# Patient Record
Sex: Female | Born: 1945 | Race: Black or African American | Hispanic: No | Marital: Single | Smoking: Former smoker
Health system: Southern US, Community
[De-identification: ages and names within clinical notes are randomized; demographics above are authoritative.]

## PROBLEM LIST (undated history)

## (undated) DIAGNOSIS — M5136 Other intervertebral disc degeneration, lumbar region: Secondary | ICD-10-CM

## (undated) DIAGNOSIS — E785 Hyperlipidemia, unspecified: Secondary | ICD-10-CM

## (undated) DIAGNOSIS — E119 Type 2 diabetes mellitus without complications: Secondary | ICD-10-CM

## (undated) DIAGNOSIS — I209 Angina pectoris, unspecified: Secondary | ICD-10-CM

## (undated) DIAGNOSIS — K5790 Diverticulosis of intestine, part unspecified, without perforation or abscess without bleeding: Secondary | ICD-10-CM

## (undated) DIAGNOSIS — M51369 Other intervertebral disc degeneration, lumbar region without mention of lumbar back pain or lower extremity pain: Secondary | ICD-10-CM

## (undated) DIAGNOSIS — I6523 Occlusion and stenosis of bilateral carotid arteries: Secondary | ICD-10-CM

## (undated) DIAGNOSIS — K219 Gastro-esophageal reflux disease without esophagitis: Secondary | ICD-10-CM

## (undated) DIAGNOSIS — I700 Atherosclerosis of aorta: Secondary | ICD-10-CM

## (undated) DIAGNOSIS — I10 Essential (primary) hypertension: Secondary | ICD-10-CM

## (undated) DIAGNOSIS — I2510 Atherosclerotic heart disease of native coronary artery without angina pectoris: Secondary | ICD-10-CM

## (undated) HISTORY — PX: KNEE ARTHROSCOPY: SHX127

## (undated) HISTORY — PX: CORONARY ANGIOPLASTY WITH STENT PLACEMENT: SHX49

## (undated) HISTORY — PX: CATARACT EXTRACTION, BILATERAL: SHX1313

---

## 1999-02-17 DIAGNOSIS — I214 Non-ST elevation (NSTEMI) myocardial infarction: Secondary | ICD-10-CM

## 1999-02-17 HISTORY — DX: Non-ST elevation (NSTEMI) myocardial infarction: I21.4

## 2003-12-30 ENCOUNTER — Ambulatory Visit: Payer: Self-pay | Admitting: Occupational Therapy

## 2005-01-02 ENCOUNTER — Ambulatory Visit: Payer: Self-pay | Admitting: Occupational Therapy

## 2006-01-03 ENCOUNTER — Ambulatory Visit: Payer: Self-pay | Admitting: Internal Medicine

## 2007-01-06 ENCOUNTER — Ambulatory Visit: Payer: Self-pay | Admitting: Internal Medicine

## 2007-07-06 ENCOUNTER — Inpatient Hospital Stay: Payer: Self-pay | Admitting: Orthopedic Surgery

## 2007-07-06 ENCOUNTER — Emergency Department (HOSPITAL_COMMUNITY): Admission: EM | Admit: 2007-07-06 | Discharge: 2007-07-06 | Payer: Self-pay | Admitting: Emergency Medicine

## 2007-11-05 ENCOUNTER — Ambulatory Visit: Payer: Self-pay | Admitting: Orthopedic Surgery

## 2008-01-08 ENCOUNTER — Ambulatory Visit: Payer: Self-pay | Admitting: Internal Medicine

## 2008-09-15 ENCOUNTER — Ambulatory Visit: Payer: Self-pay | Admitting: Unknown Physician Specialty

## 2008-12-29 ENCOUNTER — Ambulatory Visit: Payer: Self-pay | Admitting: Internal Medicine

## 2009-01-25 ENCOUNTER — Ambulatory Visit: Payer: Self-pay | Admitting: Internal Medicine

## 2010-01-26 ENCOUNTER — Ambulatory Visit: Payer: Self-pay | Admitting: Internal Medicine

## 2010-01-27 ENCOUNTER — Ambulatory Visit: Payer: Self-pay | Admitting: Internal Medicine

## 2010-10-05 LAB — DIFFERENTIAL
Basophils Absolute: 0
Basophils Relative: 0
Eosinophils Absolute: 0
Neutro Abs: 5.8
Neutrophils Relative %: 74

## 2010-10-05 LAB — BASIC METABOLIC PANEL
BUN: 13
CO2: 30
Calcium: 9.1
Chloride: 103
Creatinine, Ser: 0.83
GFR calc Af Amer: >60

## 2010-10-05 LAB — CBC
MCHC: 33.9
MCV: 89.1
Platelets: 174
RBC: 4.13
RDW: 13.5

## 2010-10-07 DIAGNOSIS — E782 Mixed hyperlipidemia: Secondary | ICD-10-CM | POA: Insufficient documentation

## 2010-10-07 DIAGNOSIS — M7120 Synovial cyst of popliteal space [Baker], unspecified knee: Secondary | ICD-10-CM | POA: Insufficient documentation

## 2010-10-07 DIAGNOSIS — I2510 Atherosclerotic heart disease of native coronary artery without angina pectoris: Secondary | ICD-10-CM | POA: Insufficient documentation

## 2011-03-12 ENCOUNTER — Ambulatory Visit: Payer: Self-pay | Admitting: Internal Medicine

## 2012-03-12 ENCOUNTER — Ambulatory Visit: Payer: Self-pay | Admitting: Internal Medicine

## 2012-08-13 ENCOUNTER — Ambulatory Visit: Payer: Self-pay | Admitting: Ophthalmology

## 2012-08-26 ENCOUNTER — Ambulatory Visit: Payer: Self-pay | Admitting: Ophthalmology

## 2012-10-07 ENCOUNTER — Ambulatory Visit: Payer: Self-pay | Admitting: Ophthalmology

## 2012-10-22 ENCOUNTER — Ambulatory Visit: Payer: Self-pay | Admitting: Ophthalmology

## 2013-05-12 ENCOUNTER — Ambulatory Visit: Payer: Self-pay | Admitting: Internal Medicine

## 2013-09-23 DIAGNOSIS — Z83719 Family history of colon polyps, unspecified: Secondary | ICD-10-CM | POA: Insufficient documentation

## 2013-09-23 DIAGNOSIS — Z8371 Family history of colonic polyps: Secondary | ICD-10-CM | POA: Insufficient documentation

## 2013-10-09 ENCOUNTER — Ambulatory Visit: Payer: Self-pay | Admitting: Unknown Physician Specialty

## 2014-04-30 NOTE — Op Note (Signed)
PATIENT NAME:  Dawn Ford, Dawn Ford MR#:  [id number] DATE OF BIRTH:  1945-09-29  DATE OF PROCEDURE:  10/22/2012  PREOPERATIVE DIAGNOSIS: Visually significant cataract of the right eye.   POSTOPERATIVE DIAGNOSIS: Visually significant cataract of the right eye.   OPERATIVE PROCEDURE: Cataract extraction by phacoemulsification with implant of intraocular lens to right eye.   SURGEON: Birder Robson, MD  ANESTHESIA:  1. Managed anesthesia care.  2. Topical tetracaine drops followed by 2% Xylocaine jelly applied in the preoperative holding area.   COMPLICATIONS: None.   TECHNIQUE:  Stop-and-chop.    DESCRIPTION OF PROCEDURE: The patient was examined and consented in the preoperative holding area where the aforementioned topical anesthesia was applied to the right eye and then brought back to the Operating Room where the right eye was prepped and draped in the usual sterile ophthalmic fashion and a lid speculum was placed. A paracentesis was created with the side port blade and the anterior chamber was filled with viscoelastic. A near clear corneal incision was performed with the steel keratome. A continuous curvilinear capsulorrhexis was performed with a cystotome followed by the capsulorrhexis forceps. Hydrodissection and hydrodelineation were carried out with BSS on a blunt cannula. The lens was removed in a stop-and-chop technique and the remaining cortical material was removed with the irrigation-aspiration handpiece. The capsular bag was inflated with viscoelastic and the Tecnis ZCB00, 22.5-diopter lens, serial number [id number] was placed in the capsular bag without complication. The remaining viscoelastic was removed from the eye with the irrigation-aspiration handpiece. The wounds were hydrated. The anterior chamber was flushed with Miostat and the eye was inflated to physiologic pressure. 0.1 mL of cefuroxime concentration 10 mg/mL was placed in the anterior chamber. The wounds were found to be  water tight. The eye was dressed with Vigamox. The patient was given protective glasses to wear throughout the day and a shield with which to sleep tonight. The patient was also given drops with which to begin a drop regimen today and will follow-up with me in 1 day.   ____________________________ Livingston Diones. Amantha Sklar, MD wlp:cs D: 10/22/2012 17:11:27 ET T: 10/22/2012 20:01:39 ET JOB#: [id number]  cc: Yocheved Depner L. Moustafa Mossa, MD, <Dictator>   Livingston Diones Welden Hausmann MD ELECTRONICALLY SIGNED 10/27/2012 13:19

## 2014-04-30 NOTE — Op Note (Signed)
PATIENT NAME:  Dawn Ford, Dawn Ford MR#:  [id number] DATE OF BIRTH:  Oct 24, 1945  DATE OF PROCEDURE:  08/26/2012  PREOPERATIVE DIAGNOSIS: Visually significant cataract of the left eye.   POSTOPERATIVE DIAGNOSIS: Visually significant cataract of the left eye.   OPERATIVE PROCEDURE: Cataract extraction by phacoemulsification with implant of intraocular lens to left eye.   SURGEON: Birder Robson, MD.   ANESTHESIA:  1. Managed anesthesia care.  2. Topical tetracaine drops followed by 2% Xylocaine jelly applied in the preoperative holding area.   COMPLICATIONS: None.   TECHNIQUE:  Stop and chop.    DESCRIPTION OF PROCEDURE: The patient was examined and consented in the preoperative holding area where the aforementioned topical anesthesia was applied to the left eye and then brought back to the Operating Room where the left eye was prepped and draped in the usual sterile ophthalmic fashion and a lid speculum was placed. A paracentesis was created with the side port blade and the anterior chamber was filled with viscoelastic. A near clear corneal incision was performed with the steel keratome. A continuous curvilinear capsulorrhexis was performed with a cystotome followed by the capsulorrhexis forceps. Hydrodissection and hydrodelineation were carried out with BSS on a blunt cannula. The lens was removed in a stop and chop technique and the remaining cortical material was removed with the irrigation-aspiration handpiece. The capsular bag was inflated with viscoelastic and the Tecnis ZCB00 21.5 diopter lens, serial [id number] was placed in the capsular bag without complication. The remaining viscoelastic was removed from the eye with the irrigation-aspiration handpiece. The wounds were hydrated. The anterior chamber was flushed with Miostat and the eye was inflated to physiologic pressure. 0.1 mL of cefuroxime concentration 10 mg/mL was placed in the anterior chamber. The wounds were found to be water tight.  The eye was dressed with Vigamox. The patient was given protective glasses to wear throughout the day and a shield with which to sleep tonight. The patient was also given drops with which to begin a drop regimen today and will follow-up with me in one day.     ____________________________ Livingston Diones. Ilai Hiller, MD wlp:cc D: 08/26/2012 14:25:02 ET T: 08/26/2012 17:52:42 ET JOB#: [id number]  cc: Devra Stare L. Bassel Gaskill, MD, <Dictator> Livingston Diones Gerry Blanchfield MD ELECTRONICALLY SIGNED 08/27/2012 13:37

## 2014-06-15 ENCOUNTER — Other Ambulatory Visit: Payer: Self-pay | Admitting: Internal Medicine

## 2014-06-15 DIAGNOSIS — Z1231 Encounter for screening mammogram for malignant neoplasm of breast: Secondary | ICD-10-CM

## 2014-06-22 ENCOUNTER — Ambulatory Visit
Admission: RE | Admit: 2014-06-22 | Discharge: 2014-06-22 | Disposition: A | Discharge status: Home / Self Care | Payer: Medicare Other | Source: Ambulatory Visit | Attending: Internal Medicine | Admitting: Internal Medicine

## 2014-06-22 ENCOUNTER — Other Ambulatory Visit: Payer: Self-pay | Admitting: Internal Medicine

## 2014-06-22 DIAGNOSIS — Z1231 Encounter for screening mammogram for malignant neoplasm of breast: Secondary | ICD-10-CM

## 2014-12-16 DIAGNOSIS — I10 Essential (primary) hypertension: Secondary | ICD-10-CM | POA: Insufficient documentation

## 2014-12-23 DIAGNOSIS — I6523 Occlusion and stenosis of bilateral carotid arteries: Secondary | ICD-10-CM | POA: Insufficient documentation

## 2015-02-02 DIAGNOSIS — E1129 Type 2 diabetes mellitus with other diabetic kidney complication: Secondary | ICD-10-CM | POA: Insufficient documentation

## 2015-02-02 DIAGNOSIS — R809 Proteinuria, unspecified: Secondary | ICD-10-CM | POA: Insufficient documentation

## 2015-05-02 ENCOUNTER — Other Ambulatory Visit: Payer: Self-pay | Admitting: Internal Medicine

## 2015-05-02 DIAGNOSIS — Z1231 Encounter for screening mammogram for malignant neoplasm of breast: Secondary | ICD-10-CM

## 2015-06-23 ENCOUNTER — Ambulatory Visit
Admission: RE | Admit: 2015-06-23 | Discharge: 2015-06-23 | Disposition: A | Discharge status: Home / Self Care | Payer: Medicare Other | Source: Ambulatory Visit | Attending: Internal Medicine | Admitting: Internal Medicine

## 2015-06-23 ENCOUNTER — Other Ambulatory Visit: Payer: Self-pay | Admitting: Internal Medicine

## 2015-06-23 DIAGNOSIS — Z1231 Encounter for screening mammogram for malignant neoplasm of breast: Secondary | ICD-10-CM

## 2016-03-04 ENCOUNTER — Emergency Department
Admission: EM | Admit: 2016-03-04 | Discharge: 2016-03-04 | Disposition: A | Discharge status: Home / Self Care | Payer: Medicare Other | Attending: Emergency Medicine | Admitting: Emergency Medicine

## 2016-03-04 DIAGNOSIS — I10 Essential (primary) hypertension: Secondary | ICD-10-CM | POA: Insufficient documentation

## 2016-03-04 DIAGNOSIS — E119 Type 2 diabetes mellitus without complications: Secondary | ICD-10-CM | POA: Diagnosis not present

## 2016-03-04 DIAGNOSIS — K529 Noninfective gastroenteritis and colitis, unspecified: Secondary | ICD-10-CM | POA: Diagnosis not present

## 2016-03-04 DIAGNOSIS — R8299 Other abnormal findings in urine: Secondary | ICD-10-CM | POA: Diagnosis not present

## 2016-03-04 DIAGNOSIS — R112 Nausea with vomiting, unspecified: Secondary | ICD-10-CM | POA: Diagnosis present

## 2016-03-04 HISTORY — DX: Type 2 diabetes mellitus without complications: E11.9

## 2016-03-04 HISTORY — DX: Essential (primary) hypertension: I10

## 2016-03-04 HISTORY — DX: Hyperlipidemia, unspecified: E78.5

## 2016-03-04 LAB — URINALYSIS, COMPLETE (UACMP) WITH MICROSCOPIC
Bilirubin Urine: NEGATIVE
GLUCOSE, UA: NEGATIVE mg/dL
Hgb urine dipstick: NEGATIVE
Ketones, ur: NEGATIVE mg/dL
Nitrite: NEGATIVE
PROTEIN: NEGATIVE mg/dL
Specific Gravity, Urine: 1.017 (ref 1.005–1.030)
pH: 5 (ref 5.0–8.0)

## 2016-03-04 LAB — COMPREHENSIVE METABOLIC PANEL
ALBUMIN: 3.9 g/dL (ref 3.5–5.0)
ALK PHOS: 59 U/L (ref 38–126)
ALT: 16 U/L (ref 14–54)
AST: 22 U/L (ref 15–41)
Anion gap: 8 (ref 5–15)
BUN: 14 mg/dL (ref 6–20)
CALCIUM: 9.2 mg/dL (ref 8.9–10.3)
CO2: 29 mmol/L (ref 22–32)
CREATININE: 0.68 mg/dL (ref 0.44–1.00)
Chloride: 103 mmol/L (ref 101–111)
GFR calc Af Amer: >60 mL/min (ref >60)
GFR calc non Af Amer: >60 mL/min (ref >60)
GLUCOSE: 139 mg/dL — AB (ref 65–99)
Potassium: 3.7 mmol/L (ref 3.5–5.1)
SODIUM: 140 mmol/L (ref 135–145)
Total Bilirubin: 0.7 mg/dL (ref 0.3–1.2)
Total Protein: 8 g/dL (ref 6.5–8.1)

## 2016-03-04 LAB — LIPASE, BLOOD: Lipase: 39 U/L (ref 11–51)

## 2016-03-04 LAB — CBC
HCT: 36.3 % (ref 35.0–47.0)
HEMOGLOBIN: 12.2 g/dL (ref 12.0–16.0)
MCH: 30 pg (ref 26.0–34.0)
MCHC: 33.5 g/dL (ref 32.0–36.0)
MCV: 89.4 fL (ref 80.0–100.0)
Platelets: 229 10*3/uL (ref 150–440)
RBC: 4.06 MIL/uL (ref 3.80–5.20)
RDW: 12.8 % (ref 11.5–14.5)
WBC: 7 10*3/uL (ref 3.6–11.0)

## 2016-03-04 LAB — TROPONIN I: Troponin I: <0.03 ng/mL (ref <0.03)

## 2016-03-04 MED ORDER — ONDANSETRON 4 MG PO TBDP
4.0000 mg | ORAL_TABLET | Freq: Once | ORAL | Status: AC
  Administered 2016-03-04: 4 mg via ORAL

## 2016-03-04 MED ORDER — ONDANSETRON HCL 4 MG PO TABS: 4.0000 mg | ORAL_TABLET | Freq: Three times a day (TID) | ORAL | 0 refills | Status: DC | PRN

## 2016-03-04 MED ORDER — ONDANSETRON 4 MG PO TBDP
ORAL_TABLET | ORAL | Status: AC
  Administered 2016-03-04: 4 mg via ORAL
  Filled 2016-03-04: qty 1

## 2016-03-04 MED ORDER — DICYCLOMINE HCL 20 MG PO TABS: 20.0000 mg | ORAL_TABLET | Freq: Three times a day (TID) | ORAL | 0 refills | Status: DC | PRN

## 2016-03-04 NOTE — ED Notes (Signed)
Pt drinking ginger ale without emesis

## 2016-03-04 NOTE — ED Triage Notes (Signed)
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

## 2016-03-04 NOTE — ED Notes (Signed)
Diarrhea and vomiting for 2 days. Denies any fevers at home.  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 No vomiting since last night. No diarrhea today. Pt was nauseated this morning. Pt has been able to eat some applesauce and toast today.

## 2016-03-04 NOTE — Discharge Instructions (Signed)
Please advanced diet as tolerated and you can take occasional Imodium over-the-counter for diarrhea. Return to emergency Department for bloody diarrhea, high fever, increasing localized abdominal pain, or any other new concerns  Please return immediately if condition worsens. Please contact her primary physician or the physician you were given for referral. If you have any specialist physicians involved in her treatment and plan please also contact them. Thank you for using [place] [facility].

## 2016-03-04 NOTE — ED Provider Notes (Signed)
Time Seen: Approximately 1623  I have reviewed the triage notes  Chief Complaint: Emesis and Diarrhea   History of Present Illness: Dawn Ford is a 71 y.o. female *who presents with nausea, vomiting and diarrhea over the last 2 days. She states the nausea and vomiting part has decreased mildly though she still feels nauseated. She's had a decreased appetite without any fever or focal abdominal pain. She describes loose watery stools, just 1 today. She denies any melena or hematochezia. 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 She denies any lightheadedness. She denies any recent antibiotics or travel. She is not aware of any foodborne exposure.   Past Medical History:  Diagnosis Date  . Diabetes mellitus without complication ([place])   . Hyperlipemia   . Hypertension     There are no active problems to display for this patient.   History reviewed. No pertinent surgical history.  History reviewed. No pertinent surgical history.  Current Outpatient Rx  . Order #: [id number] Class: Print  . Order #: [id number] Class: Print    Allergies:  Patient has no known allergies.  Family History: No family history on file.  Social History: Social History  Substance Use Topics  . Smoking status: Never Smoker  . Smokeless tobacco: Never Used  . Alcohol use No     Review of Systems:   10 point review of systems was performed and was otherwise negative:  Constitutional: No fever Eyes: No visual disturbances ENT: No sore throat, ear pain Cardiac: No chest pain Respiratory: No shortness of breath, wheezing, or stridor Abdomen: No abdominal pain,  Endocrine: No weight loss, No night sweats Extremities: No peripheral edema, cyanosis Skin: No rashes, easy bruising Neurologic: No focal weakness, trouble with speech or swollowing Urologic: No dysuria, Hematuria, or urinary frequency   Physical Exam:  ED Triage Vitals  Enc Vitals Group      BP 03/04/16 1503 (!) 157/55     Pulse Rate 03/04/16 1503 75     Resp 03/04/16 1503 18     Temp 03/04/16 1503 99.1 F (37.3 C)     Temp Source 03/04/16 1503 Oral     SpO2 03/04/16 1503 96 %     Weight 03/04/16 1458 180 lb (81.6 kg)     Height 03/04/16 1458 5\' 6"  (1.676 m)     Head Circumference --      Peak Flow --      Pain Score 03/04/16 1624 0     Pain Loc --      Pain Edu? --      Excl. in [place]? --     General: Awake , Alert , and Oriented times 3; GCS 15 Head: Normal cephalic , atraumatic Eyes: Pupils equal , round, reactive to light Nose/Throat: No nasal drainage, patent upper airway without erythema or exudate.  Neck: Supple, Full range of motion, No anterior adenopathy or palpable thyroid masses Lungs: Clear to ascultation without wheezes , rhonchi, or rales Heart: Regular rate, regular rhythm without murmurs , gallops , or rubs Abdomen: Soft, non tender without rebound, guarding , or rigidity; bowel sounds positive and symmetric in all 4 quadrants. No organomegaly .        Extremities: 2 plus symmetric pulses. No edema, clubbing or cyanosis Neurologic: normal ambulation, Motor symmetric without deficits, sensory intact Skin: warm, dry, no rashes   Labs:   All laboratory work was reviewed including any pertinent negatives or positives listed below:  Labs Reviewed  COMPREHENSIVE METABOLIC PANEL - Abnormal; Notable for the following:       Result Value   Glucose, Bld 139 (*)    All other components within normal limits  URINALYSIS, COMPLETE (UACMP) WITH MICROSCOPIC - Abnormal; Notable for the following:    Color, Urine YELLOW (*)    APPearance HAZY (*)    Leukocytes, UA MODERATE (*)    Bacteria, UA RARE (*)    Squamous Epithelial / LPF 0-5 (*)    All other components within normal limits  URINE CULTURE  LIPASE, BLOOD  CBC  TROPONIN I  Patient's urine was equivocal and a urine culture was added. Otherwise, laboratory work within normal limits  EKG:  ED ECG  REPORT I, Daymon Larsen, the attending physician, personally viewed and interpreted this ECG.  Date: 03/04/2016 EKG Time: 1509 Rate: *74 Rhythm: normal sinus rhythm QRS Axis: normal Intervals: normal ST/T Wave abnormalities: Nonspecific T wave abnormality Conduction Disturbances: none Narrative Interpretation: unremarkable   ED Course:  Patient's stay here was uneventful and the patient's otherwise hemodynamically stable and does not appear to be significantly dehydrated or have an acute abdomen, etc. Given her clinical presentation and objective findings I felt most likely this was viral gastroenteritis. She was advised to return here if she develops bloody diarrhea, fever or focal abdominal pain or any other new concerns.      Final Clinical Impression:   Final diagnoses:  Gastroenteritis     Plan:  Outpatient " New Prescriptions   DICYCLOMINE (BENTYL) 20 MG TABLET    Take 1 tablet (20 mg total) by mouth 3 (three) times daily as needed for spasms.   ONDANSETRON (ZOFRAN) 4 MG TABLET    Take 1 tablet (4 mg total) by mouth every 8 (eight) hours as needed for nausea or vomiting.  "  Patient was advised to return immediately if condition worsens. Patient was advised to follow up with their primary care physician or other specialized physicians involved in their outpatient care. The patient and/or family member/power of attorney had laboratory results reviewed at the bedside. All questions and concerns were addressed and appropriate discharge instructions were distributed by the nursing staff.             Daymon Larsen, MD 03/04/16 [phone]

## 2016-03-06 LAB — URINE CULTURE

## 2016-05-11 ENCOUNTER — Other Ambulatory Visit: Payer: Self-pay | Admitting: Internal Medicine

## 2016-05-11 DIAGNOSIS — Z1231 Encounter for screening mammogram for malignant neoplasm of breast: Secondary | ICD-10-CM

## 2016-06-25 ENCOUNTER — Ambulatory Visit
Admission: RE | Admit: 2016-06-25 | Discharge: 2016-06-25 | Disposition: A | Discharge status: Home / Self Care | Payer: Medicare Other | Source: Ambulatory Visit | Attending: Internal Medicine | Admitting: Internal Medicine

## 2016-06-25 DIAGNOSIS — Z1231 Encounter for screening mammogram for malignant neoplasm of breast: Secondary | ICD-10-CM | POA: Diagnosis present

## 2016-12-13 ENCOUNTER — Emergency Department
Admission: EM | Admit: 2016-12-13 | Discharge: 2016-12-13 | Disposition: A | Discharge status: Home / Self Care | Payer: Medicare Other | Attending: Emergency Medicine | Admitting: Emergency Medicine

## 2016-12-13 ENCOUNTER — Other Ambulatory Visit: Payer: Self-pay

## 2016-12-13 ENCOUNTER — Emergency Department: Payer: Medicare Other

## 2016-12-13 ENCOUNTER — Telehealth: Payer: Self-pay | Admitting: Gastroenterology

## 2016-12-13 DIAGNOSIS — R0989 Other specified symptoms and signs involving the circulatory and respiratory systems: Secondary | ICD-10-CM | POA: Insufficient documentation

## 2016-12-13 DIAGNOSIS — K228 Other specified diseases of esophagus: Secondary | ICD-10-CM

## 2016-12-13 DIAGNOSIS — Z79899 Other long term (current) drug therapy: Secondary | ICD-10-CM | POA: Insufficient documentation

## 2016-12-13 DIAGNOSIS — R198 Other specified symptoms and signs involving the digestive system and abdomen: Secondary | ICD-10-CM

## 2016-12-13 DIAGNOSIS — E119 Type 2 diabetes mellitus without complications: Secondary | ICD-10-CM | POA: Diagnosis not present

## 2016-12-13 DIAGNOSIS — I10 Essential (primary) hypertension: Secondary | ICD-10-CM | POA: Diagnosis not present

## 2016-12-13 LAB — CBC WITH DIFFERENTIAL/PLATELET
BASOS ABS: 0 10*3/uL (ref 0–0.1)
Basophils Relative: 1 %
Eosinophils Absolute: 0.1 10*3/uL (ref 0–0.7)
Eosinophils Relative: 1 %
HEMATOCRIT: 35.9 % (ref 35.0–47.0)
Hemoglobin: 11.8 g/dL — ABNORMAL LOW (ref 12.0–16.0)
LYMPHS ABS: 1.2 10*3/uL (ref 1.0–3.6)
LYMPHS PCT: 17 %
MCH: 29.4 pg (ref 26.0–34.0)
MCHC: 33 g/dL (ref 32.0–36.0)
MCV: 89.2 fL (ref 80.0–100.0)
MONO ABS: 0.5 10*3/uL (ref 0.2–0.9)
Monocytes Relative: 7 %
NEUTROS ABS: 5.3 10*3/uL (ref 1.4–6.5)
Neutrophils Relative %: 74 %
Platelets: 229 10*3/uL (ref 150–440)
RBC: 4.02 MIL/uL (ref 3.80–5.20)
RDW: 13.7 % (ref 11.5–14.5)
WBC: 7.1 10*3/uL (ref 3.6–11.0)

## 2016-12-13 LAB — COMPREHENSIVE METABOLIC PANEL
ALT: 17 U/L (ref 14–54)
AST: 24 U/L (ref 15–41)
Albumin: 3.8 g/dL (ref 3.5–5.0)
Alkaline Phosphatase: 74 U/L (ref 38–126)
Anion gap: 9 (ref 5–15)
BILIRUBIN TOTAL: 0.6 mg/dL (ref 0.3–1.2)
BUN: 13 mg/dL (ref 6–20)
CO2: 26 mmol/L (ref 22–32)
CREATININE: 0.89 mg/dL (ref 0.44–1.00)
Calcium: 9.4 mg/dL (ref 8.9–10.3)
Chloride: 101 mmol/L (ref 101–111)
GFR calc Af Amer: >60 mL/min (ref >60)
Glucose, Bld: 161 mg/dL — ABNORMAL HIGH (ref 65–99)
Potassium: 3.5 mmol/L (ref 3.5–5.1)
Sodium: 136 mmol/L (ref 135–145)
TOTAL PROTEIN: 8.1 g/dL (ref 6.5–8.1)

## 2016-12-13 LAB — TROPONIN I: Troponin I: <0.03 ng/mL (ref <0.03)

## 2016-12-13 LAB — LIPASE, BLOOD: LIPASE: 44 U/L (ref 11–51)

## 2016-12-13 MED ORDER — GI COCKTAIL ~~LOC~~
30.0000 mL | Freq: Once | ORAL | Status: AC
  Administered 2016-12-13: 30 mL via ORAL
  Filled 2016-12-13: qty 30

## 2016-12-13 MED ORDER — FAMOTIDINE 40 MG PO TABS: 40.0000 mg | ORAL_TABLET | Freq: Every evening | ORAL | 1 refills | Status: DC

## 2016-12-13 MED ORDER — FAMOTIDINE IN NACL 20-0.9 MG/50ML-% IV SOLN
20.0000 mg | Freq: Once | INTRAVENOUS | Status: AC
  Administered 2016-12-13: 20 mg via INTRAVENOUS
  Filled 2016-12-13: qty 50

## 2016-12-13 NOTE — ED Notes (Signed)
Pt resting in bed, resp even and unlabored, denies any needs, TV turned on

## 2016-12-13 NOTE — ED Triage Notes (Signed)
Pt states she feels like food or something in stuck in her esophagus since last night, states normally when she feels like this she takes 2 tums and it will go away but states she is not feeling any better.

## 2016-12-13 NOTE — Telephone Encounter (Signed)
lvm for patient to call and schedule a 1-2 week follow up with Dr. Bonna Gains

## 2016-12-13 NOTE — ED Provider Notes (Addendum)
[facility] Emergency Department Provider Note  ____________________________________________   I have reviewed the triage vital signs and the nursing notes.   HISTORY  Chief Complaint Foreign Body    HPI Dawn Ford is a 71 y.o. female with a history of diabetes, as well as acid indigestion, hypertension presents today complaining of a foreign body sensation in her throat.  When she swallows she feels it "go down" but otherwise that the discomfort in her throat.  She is able to tolerate liquids with no difficulty.  She does take pills, she took a pill last night before going to bed and felt okay.  And then at 4:00 she woke up with a discomfort in her throat which is again made slightly better when she swallows.  She has no pain, is a sensation of a foreign body she states.  She has had no fever chills no nausea no vomiting she is most aware of it when she begins to swallow then as she swallows and feels better and then it seems to persist a little bit after swallowing.  No abdominal pain.  She has no pain of any variety.  She has no exertional symptoms no shortness of breath no pleuritic chest pain, she states that it is just a sensation in her esophagus that seems to get better when she swallows.  It feels like her reflux in the past.  She has no other complaints.  No melena no bright red blood per rectum.   Location: Lower mid chest Radiation: None Quality: See above Duration: This morning Timing: Seems to be in some ways changed by swallowing Severity: Painless Associated sxs: None PriorTreatment : Antiacids in the past   Past Medical History:  Diagnosis Date  . Diabetes mellitus without complication ([place])   . Hyperlipemia   . Hypertension     There are no active problems to display for this patient.   Past Surgical History:  Procedure Laterality Date  . CORONARY ANGIOPLASTY WITH STENT PLACEMENT    . KNEE ARTHROSCOPY      Prior to Admission  medications   Medication Sig Start Date End Date Taking? Authorizing Provider  dicyclomine (BENTYL) 20 MG tablet Take 1 tablet (20 mg total) by mouth 3 (three) times daily as needed for spasms. 03/04/16   Daymon Larsen, MD  ondansetron (ZOFRAN) 4 MG tablet Take 1 tablet (4 mg total) by mouth every 8 (eight) hours as needed for nausea or vomiting. 03/04/16   Daymon Larsen, MD    Allergies Patient has no known allergies.  No family history on file.  Social History Social History   Tobacco Use  . Smoking status: Never Smoker  . Smokeless tobacco: Never Used  Substance Use Topics  . Alcohol use: No  . Drug use: No    Review of Systems Constitutional: No fever/chills Eyes: No visual changes. ENT: No sore throat. No stiff neck no neck pain Cardiovascular: Denies chest pain. Respiratory: Denies shortness of breath. Gastrointestinal:   no vomiting.  No diarrhea.  No constipation. Genitourinary: Negative for dysuria. Musculoskeletal: Negative lower extremity swelling Skin: Negative for rash. Neurological: Negative for severe headaches, focal weakness or numbness.   ____________________________________________   PHYSICAL EXAM:  VITAL SIGNS: ED Triage Vitals  Enc Vitals Group     BP 12/13/16 0816 (!) 177/55     Pulse Rate 12/13/16 0816 72     Resp 12/13/16 0816 16     Temp 12/13/16 0816 98 F (36.7 C)  Temp Source 12/13/16 0816 Oral     SpO2 12/13/16 0816 100 %     Weight 12/13/16 0816 188 lb (85.3 kg)     Height 12/13/16 0816 5\' 7"  (1.702 m)     Head Circumference --      Peak Flow --      Pain Score 12/13/16 0818 7     Pain Loc --      Pain Edu? --      Excl. in [place]? --     Constitutional: Alert and oriented. Well appearing and in no acute distress. Eyes: Conjunctivae are normal Head: Atraumatic HEENT: No congestion/rhinnorhea. Mucous membranes are moist.  Oropharynx non-erythematous Neck:   Nontender with no meningismus, no masses, no  stridor Cardiovascular: Normal rate, regular rhythm. Grossly normal heart sounds.  Good peripheral circulation. Respiratory: Normal respiratory effort.  No retractions. Lungs CTAB. Abdominal: Soft and nontender. No distention. No guarding no rebound Back:  There is no focal tenderness or step off.  there is no midline tenderness there are no lesions noted. there is no CVA tenderness Musculoskeletal: No lower extremity tenderness, no upper extremity tenderness. No joint effusions, no DVT signs strong distal pulses no edema Neurologic:  Normal speech and language. No gross focal neurologic deficits are appreciated.  Skin:  Skin is warm, dry and intact. No rash noted. Psychiatric: Mood and affect are normal. Speech and behavior are normal.  ____________________________________________   LABS (all labs ordered are listed, but only abnormal results are displayed)  Labs Reviewed  CBC WITH DIFFERENTIAL/PLATELET - Abnormal; Notable for the following components:      Result Value   Hemoglobin 11.8 (*)    All other components within normal limits  COMPREHENSIVE METABOLIC PANEL - Abnormal; Notable for the following components:   Glucose, Bld 161 (*)    All other components within normal limits  TROPONIN I  LIPASE, BLOOD    Pertinent labs  results that were available during my care of the patient were reviewed by me and considered in my medical decision making (see chart for details). ____________________________________________  EKG  I personally interpreted any EKGs ordered by me or triage Sinus rhythm rate 66 bpm no acute ST elevation or depression not specific ST flattening, normal axis ____________________________________________  RADIOLOGY  Pertinent labs & imaging results that were available during my care of the patient were reviewed by me and considered in my medical decision making (see chart for details). If possible, patient and/or family made aware of any abnormal  findings.  Dg Chest 2 View  Result Date: 12/13/2016 CLINICAL DATA:  Sensation of foreign body at the sternal notch. EXAM: CHEST  2 VIEW COMPARISON:  07/06/2007 FINDINGS: Heart size is normal. There is aortic atherosclerosis. Lungs are clear. No effusions. Mediastinal shadows appear otherwise normal without radiopaque foreign object. IMPRESSION: No active cardiopulmonary disease. Aortic atherosclerosis. No radiopaque foreign object. Electronically Signed   By: Nelson Chimes M.D.   On: 12/13/2016 08:56   ____________________________________________    PROCEDURES  Procedure(s) performed: None  Procedures  Critical Care performed: None  ____________________________________________   INITIAL IMPRESSION / ASSESSMENT AND PLAN / ED COURSE  Pertinent labs & imaging results that were available during my care of the patient were reviewed by me and considered in my medical decision making (see chart for details).  She is here with a throat, she has no evidence of impaction she is tolerating secretions and water well, it is somewhat altered I guess is the best way to  say when she swallows.  Seems to get a little bit better when she swallows and then come back.  We gave her a GI cocktail and her symptoms completely resolved.  Even though I very low suspicion given this history of ACS I did do a troponin.  Despite 5 hours of continued symptoms, the troponin and EKG are reassuring I do not think this represents ACS or PE or dissection I do not think that the patient requires prolonged serial enzymes given negative enzymes after 5 hours of continued symptomology.  Symptoms certainly are not consistent with ACS in any event, and I did do a troponin out of an abundance of caution given her age and comorbidities.  No evidence of obstruction or ongoing foreign body sensation.  We will discussed with GI.  She likely can follow closely with them for further care.  She has no symptoms after GI cocktail and while  this is not enough to rule out cardiac disease it certainly is suggestive in context of all of her symptoms of a possible proximal GI pathology  ----------------------------------------- 11:10 AM on 12/13/2016 -----------------------------------------  D/w dr. Bonna Gains, we discussed the patient's symptoms signs findings and complaint etc. and she agrees with outpatient follow-up in her office.  They will call her for an appointment at the time they deemed reasonable.  Patient continues to have no symptoms here.  I have continue to observe her she has no ongoing symptoms we will send another cardiac marker and I will give her antiacids as I suspect this is mostly related to that.  He is able to eat and drink here with no discomfort at this time after GI cocktail   ____________________________________________   FINAL CLINICAL IMPRESSION(S) / ED DIAGNOSES  Final diagnoses:  None      This chart was dictated using voice recognition software.  Despite best efforts to proofread,  errors can occur which can change meaning.      Schuyler Amor, MD 12/13/16 1696    Schuyler Amor, MD 12/13/16 [id number]    Schuyler Amor, MD 12/13/16 [id number]

## 2016-12-13 NOTE — ED Notes (Signed)
Pt alert and oriented X4, active, cooperative, pt in NAD. RR even and unlabored, color WNL.  Pt informed to return if any life threatening symptoms occur.  Discharge and followup instructions reviewed.  

## 2016-12-13 NOTE — ED Notes (Signed)
Patient transported to X-ray 

## 2016-12-13 NOTE — Discharge Instructions (Signed)
Return to the emergency room for any chest pain, shortness of breath, nausea, vomiting, bleeding, black stool, any difficulty swallowing food, liquid, or saliva, fever,, or any other concerns.  Follow closely with her primary care doctor and with GI medicine, take antiacids as prescribed at home

## 2016-12-13 NOTE — ED Notes (Signed)
Feeling of something stuck is now gone after medication. No needs at this time.

## 2017-02-13 ENCOUNTER — Other Ambulatory Visit: Payer: Self-pay

## 2017-02-13 ENCOUNTER — Encounter: Payer: Self-pay | Admitting: Gastroenterology

## 2017-02-13 ENCOUNTER — Encounter (INDEPENDENT_AMBULATORY_CARE_PROVIDER_SITE_OTHER): Payer: Self-pay

## 2017-02-13 ENCOUNTER — Ambulatory Visit (INDEPENDENT_AMBULATORY_CARE_PROVIDER_SITE_OTHER): Payer: Medicare Other | Admitting: Gastroenterology

## 2017-02-13 VITALS — BP 131/70 | HR 76 | Ht 67.0 in | Wt 189.0 lb

## 2017-02-13 DIAGNOSIS — R0989 Other specified symptoms and signs involving the circulatory and respiratory systems: Secondary | ICD-10-CM | POA: Diagnosis not present

## 2017-02-13 DIAGNOSIS — R1319 Other dysphagia: Secondary | ICD-10-CM

## 2017-02-13 DIAGNOSIS — R1310 Dysphagia, unspecified: Secondary | ICD-10-CM | POA: Diagnosis not present

## 2017-02-13 DIAGNOSIS — K219 Gastro-esophageal reflux disease without esophagitis: Secondary | ICD-10-CM

## 2017-02-13 MED ORDER — FAMOTIDINE 40 MG PO TABS: 40.0000 mg | ORAL_TABLET | Freq: Every evening | ORAL | 2 refills | Status: DC

## 2017-02-13 NOTE — Progress Notes (Signed)
Vonda Antigua [address]  [place]  [place], [place] [address]  Main: [phone]  Fax: [phone]   Gastroenterology Consultation  Referring Provider:     Idelle Crouch, MD Primary Care Physician:  Idelle Crouch, MD Primary Gastroenterologist:  Dr. Vonda Antigua Reason for Consultation:     Foreign body sensation        HPI:   Dawn Ford is a 72 y.o. y/o female referred for consultation & management  by Dr. Doy Hutching, Leonie Douglas, MD.  Patient here for hospital follow-up for foreign body sensation in her throat.  Patient states she had supper on December 6, and was able to swallow her food without any difficulty.  About 5 or 6 hours later she felt as if something was in her chest, points to the xiphoid notch, and also had regurgitation.  She visited the ER on December 6 due to her symptoms.  However, she was tolerating liquids and her secretions with no difficulty, and as per the ER notes she did not have any evidence of food impaction, and was discharged from the ER with GI follow-up.  Given her age, troponin was also done to rule out ACS and was negative.  She was given a GI cocktail and her symptoms resolved after that.  She was also given famotidine to take as an outpatient.  Her symptoms have not reoccurred since then.  No nausea or vomiting during the episode.  No weight loss.  No abdominal pain.  No symptoms of chest pain or dizziness or any alarm symptoms present at this time.  Prior to this, she describes an episode 2 years ago when she was eating pork chops with broccoli sauce, and immediately on swallowing it got stuck in her mid chest.  She followed this with a lot of water and it eventually relieved.  No ER visits with food impaction.  Denies any altered bowel habits, blood in stool, hematemesis, melena or NSAID use.  No previous EGDs Last colonoscopy was in October 2015 by Dr. Vira Agar, extent of exam cecum, excellent prep, normal colon, repeat  recommended in 5 years. September 2010 colonoscopy for screening was normal as well.  Past Medical History:  Diagnosis Date  . Diabetes mellitus without complication ([place])   . Hyperlipemia   . Hypertension     Past Surgical History:  Procedure Laterality Date  . CORONARY ANGIOPLASTY WITH STENT PLACEMENT    . KNEE ARTHROSCOPY      Prior to Admission medications   Medication Sig Start Date End Date Taking? Authorizing Provider  Blood Glucose Monitoring Suppl (FIFTY50 GLUCOSE METER 2.0) w/Device KIT Use as directed. 05/09/16 05/09/17 Yes [provider]  dicyclomine (BENTYL) 20 MG tablet Take 1 tablet (20 mg total) by mouth 3 (three) times daily as needed for spasms. 03/04/16  Yes Daymon Larsen, MD  famotidine (PEPCID) 40 MG tablet Take 1 tablet (40 mg total) by mouth every evening. 12/13/16 12/13/17 Yes Schuyler Amor, MD  glimepiride (AMARYL) 4 MG tablet Take by mouth. 11/05/16  Yes [provider]  glucose blood (PRECISION QID TEST) test strip Use once daily. Use as instructed. 05/09/16 05/09/17 Yes [provider]  metoprolol tartrate (LOPRESSOR) 50 MG tablet  02/02/17  Yes [provider]  ondansetron (ZOFRAN) 4 MG tablet Take 1 tablet (4 mg total) by mouth every 8 (eight) hours as needed for nausea or vomiting. 03/04/16  Yes Daymon Larsen, MD  [facility] VERIO test strip  02/11/17  Yes [provider]  pioglitazone (ACTOS) 15 MG tablet Take by mouth. 11/05/16  Yes [provider]  ramipril (ALTACE) 10 MG capsule  11/28/16  Yes [provider]  [place] 18 MG/3ML SOPN  02/10/17  Yes [provider]    No family history on file.   Social History   Tobacco Use  . Smoking status: Former Smoker    Last attempt to quit: 2000    Years since quitting: 19.1  . Smokeless tobacco: Never Used  Substance Use Topics  . Alcohol use: No  . Drug use: No    Allergies as of 02/13/2017  . (No Known Allergies)    Review of  Systems:    All systems reviewed and negative except where noted in HPI.   Physical Exam:  BP 131/70   Pulse 76   Ht _0  (1.702 m)   Wt 189 lb (85.7 kg)   BMI 29.60 kg/m  No LMP recorded. Patient is postmenopausal. Psych:  Alert and cooperative. Normal mood and affect. General:   Alert,  Well-developed, well-nourished, pleasant and cooperative in NAD Head:  Normocephalic and atraumatic. Eyes:  Sclera clear, no icterus.   Conjunctiva pink. Ears:  Normal auditory acuity. Nose:  No deformity, discharge, or lesions. Mouth:  No deformity or lesions,oropharynx pink & moist. Neck:  Supple; no masses or thyromegaly. Lungs:  Respirations even and unlabored.  Clear throughout to auscultation.   No wheezes, crackles, or rhonchi. No acute distress. Heart:  Regular rate and rhythm; no murmurs, clicks, rubs, or gallops. Abdomen:  Normal bowel sounds.  No bruits.  Soft, non-tender and non-distended without masses, hepatosplenomegaly or hernias noted.  No guarding or rebound tenderness.    Msk:  Symmetrical without gross deformities. Good, equal movement & strength bilaterally. Pulses:  Normal pulses noted. Extremities:  No clubbing or edema.  No cyanosis. Neurologic:  Alert and oriented x3;  grossly normal neurologically. Skin:  Intact without significant lesions or rashes. No jaundice. Lymph Nodes:  No significant cervical adenopathy. Psych:  Alert and cooperative. Normal mood and affect.   Labs: CBC    Component Value Date/Time   WBC 7.1 12/13/2016 0834   RBC 4.02 12/13/2016 0834   HGB 11.8 (L) 12/13/2016 0834   HCT 35.9 12/13/2016 0834   PLT 229 12/13/2016 0834   MCV 89.2 12/13/2016 0834   MCH 29.4 12/13/2016 0834   MCHC 33.0 12/13/2016 0834   RDW 13.7 12/13/2016 0834   LYMPHSABS 1.2 12/13/2016 0834   MONOABS 0.5 12/13/2016 0834   EOSABS 0.1 12/13/2016 0834   BASOSABS 0.0 12/13/2016 0834   CMP     Component Value Date/Time   NA 136 12/13/2016 0834   K 3.5 12/13/2016 0834    K 3.8 10/07/2012 1128   CL 101 12/13/2016 0834   CO2 26 12/13/2016 0834   GLUCOSE 161 (H) 12/13/2016 0834   BUN 13 12/13/2016 0834   CREATININE 0.89 12/13/2016 0834   CALCIUM 9.4 12/13/2016 0834   PROT 8.1 12/13/2016 0834   ALBUMIN 3.8 12/13/2016 0834   AST 24 12/13/2016 0834   ALT 17 12/13/2016 0834   ALKPHOS 74 12/13/2016 0834   BILITOT 0.6 12/13/2016 0834   GFRNONAA >60 12/13/2016 0834   GFRAA >60 12/13/2016 0834    Imaging Studies: No results found.  Assessment and Plan:   MANREET KIERNAN is a 72 y.o. y/o female has been referred for episode of foreign body sensation that led to an ER visit in December  2018 without any clinical evidence of food impaction at the time  Given her symptoms, will schedule for an EGD for evaluation for narrowing, strictures, EOE or esophagitis Her symptoms may be related to acid reflux, given that they have improved with daily famotidine.  However, she describes a previous episode of food impaction 2 years ago, and thus with the recurrence in symptoms recently during the ER visit, evaluation with EGD is indicated at this time.  No alarm symptoms present to indicate emergent EGD at this time.  No evidence of food impaction present. (I have discussed alternative options, risks & benefits,  which include, but are not limited to, bleeding, infection, perforation,respiratory complication & drug reaction.  The patient agrees with this plan & written consent will be obtained. )  Acid reflux lifestyle modifications discussed extensively.  Symptoms of acid reflux are controlled at this time.  Colonoscopy up-to-date as per HPI.  Next screening due in October 2020  Dr Vonda Antigua

## 2017-02-13 NOTE — Patient Instructions (Signed)
F/U 3 months    Gastroesophageal Reflux Disease, Adult Normally, food travels down the esophagus and stays in the stomach to be digested. If a person has gastroesophageal reflux disease (GERD), food and stomach acid move back up into the esophagus. When this happens, the esophagus becomes sore and swollen (inflamed). Over time, GERD can make small holes (ulcers) in the lining of the esophagus. Follow these instructions at home: Diet  Follow a diet as told by your doctor. You may need to avoid foods and drinks such as: ? Coffee and tea (with or without caffeine). ? Drinks that contain alcohol. ? Energy drinks and sports drinks. ? Carbonated drinks or sodas. ? Chocolate and cocoa. ? Peppermint and mint flavorings. ? Garlic and onions. ? Horseradish. ? Spicy and acidic foods, such as peppers, chili powder, curry powder, vinegar, hot sauces, and BBQ sauce. ? Citrus fruit juices and citrus fruits, such as oranges, lemons, and limes. ? Tomato-based foods, such as red sauce, chili, salsa, and pizza with red sauce. ? Fried and fatty foods, such as donuts, french fries, potato chips, and high-fat dressings. ? High-fat meats, such as hot dogs, rib eye steak, sausage, ham, and bacon. ? High-fat dairy items, such as whole milk, butter, and cream cheese.  Eat small meals often. Avoid eating large meals.  Avoid drinking large amounts of liquid with your meals.  Avoid eating meals during the 2-3 hours before bedtime.  Avoid lying down right after you eat.  Do not exercise right after you eat. General instructions  Pay attention to any changes in your symptoms.  Take over-the-counter and prescription medicines only as told by your doctor. Do not take aspirin, ibuprofen, or other NSAIDs unless your doctor says it is okay.  Do not use any tobacco products, including cigarettes, chewing tobacco, and e-cigarettes. If you need help quitting, ask your doctor.  Wear loose clothes. Do not wear  anything tight around your waist.  Raise (elevate) the head of your bed about 6 inches (15 cm).  Try to lower your stress. If you need help doing this, ask your doctor.  If you are overweight, lose an amount of weight that is healthy for you. Ask your doctor about a safe weight loss goal.  Keep all follow-up visits as told by your doctor. This is important. Contact a doctor if:  You have new symptoms.  You lose weight and you do not know why it is happening.  You have trouble swallowing, or it hurts to swallow.  You have wheezing or a cough that keeps happening.  Your symptoms do not get better with treatment.  You have a hoarse voice. Get help right away if:  You have pain in your arms, neck, jaw, teeth, or back.  You feel sweaty, dizzy, or light-headed.  You have chest pain or shortness of breath.  You throw up (vomit) and your throw up looks like blood or coffee grounds.  You pass out (faint).  Your poop (stool) is bloody or black.  You cannot swallow, drink, or eat. This information is not intended to replace advice given to you by your health care provider. Make sure you discuss any questions you have with your health care provider. Document Released: 06/13/2007 Document Revised: 06/02/2015 Document Reviewed: 04/21/2014 Elsevier Interactive Patient Education  [employer].

## 2017-02-18 ENCOUNTER — Other Ambulatory Visit: Payer: Self-pay

## 2017-02-18 DIAGNOSIS — K219 Gastro-esophageal reflux disease without esophagitis: Secondary | ICD-10-CM

## 2017-03-13 ENCOUNTER — Ambulatory Visit: Payer: Medicare Other | Admitting: Anesthesiology

## 2017-03-13 ENCOUNTER — Ambulatory Visit
Admission: RE | Admit: 2017-03-13 | Discharge: 2017-03-13 | Disposition: A | Discharge status: Home / Self Care | Payer: Medicare Other | Source: Ambulatory Visit | Attending: Gastroenterology | Admitting: Gastroenterology

## 2017-03-13 ENCOUNTER — Encounter: Payer: Self-pay | Admitting: *Deleted

## 2017-03-13 ENCOUNTER — Encounter
Admission: RE | Disposition: A | Discharge status: Home / Self Care | Payer: Self-pay | Source: Ambulatory Visit | Attending: Gastroenterology

## 2017-03-13 DIAGNOSIS — R1310 Dysphagia, unspecified: Secondary | ICD-10-CM

## 2017-03-13 DIAGNOSIS — R12 Heartburn: Secondary | ICD-10-CM | POA: Diagnosis not present

## 2017-03-13 DIAGNOSIS — Z87891 Personal history of nicotine dependence: Secondary | ICD-10-CM | POA: Diagnosis not present

## 2017-03-13 DIAGNOSIS — Z7984 Long term (current) use of oral hypoglycemic drugs: Secondary | ICD-10-CM | POA: Diagnosis not present

## 2017-03-13 DIAGNOSIS — K219 Gastro-esophageal reflux disease without esophagitis: Secondary | ICD-10-CM

## 2017-03-13 DIAGNOSIS — R1319 Other dysphagia: Secondary | ICD-10-CM

## 2017-03-13 DIAGNOSIS — E785 Hyperlipidemia, unspecified: Secondary | ICD-10-CM | POA: Insufficient documentation

## 2017-03-13 DIAGNOSIS — K222 Esophageal obstruction: Secondary | ICD-10-CM | POA: Diagnosis not present

## 2017-03-13 DIAGNOSIS — I10 Essential (primary) hypertension: Secondary | ICD-10-CM | POA: Diagnosis not present

## 2017-03-13 DIAGNOSIS — K3189 Other diseases of stomach and duodenum: Secondary | ICD-10-CM | POA: Diagnosis not present

## 2017-03-13 DIAGNOSIS — Z955 Presence of coronary angioplasty implant and graft: Secondary | ICD-10-CM | POA: Insufficient documentation

## 2017-03-13 DIAGNOSIS — Z79899 Other long term (current) drug therapy: Secondary | ICD-10-CM | POA: Insufficient documentation

## 2017-03-13 DIAGNOSIS — E119 Type 2 diabetes mellitus without complications: Secondary | ICD-10-CM | POA: Insufficient documentation

## 2017-03-13 HISTORY — PX: ESOPHAGOGASTRODUODENOSCOPY (EGD) WITH PROPOFOL: SHX5813

## 2017-03-13 LAB — GLUCOSE, CAPILLARY: Glucose-Capillary: 80 mg/dL (ref 65–99)

## 2017-03-13 SURGERY — ESOPHAGOGASTRODUODENOSCOPY (EGD) WITH PROPOFOL
Anesthesia: General

## 2017-03-13 MED ORDER — PROPOFOL 500 MG/50ML IV EMUL
INTRAVENOUS | Status: DC | PRN
  Administered 2017-03-13: 50 ug/kg/min via INTRAVENOUS

## 2017-03-13 MED ORDER — PROPOFOL 500 MG/50ML IV EMUL
INTRAVENOUS | Status: AC
  Filled 2017-03-13: qty 50

## 2017-03-13 MED ORDER — SODIUM CHLORIDE 0.9 % IV SOLN
INTRAVENOUS | Status: DC
  Administered 2017-03-13 (×2): via INTRAVENOUS

## 2017-03-13 NOTE — Anesthesia Postprocedure Evaluation (Signed)
Anesthesia Post Note  Patient: Dawn Ford  Procedure(s) Performed: ESOPHAGOGASTRODUODENOSCOPY (EGD) WITH PROPOFOL (N/A )  Patient location during evaluation: Endoscopy Anesthesia Type: General Level of consciousness: awake and alert Pain management: pain level controlled Vital Signs Assessment: post-procedure vital signs reviewed and stable Respiratory status: spontaneous breathing, nonlabored ventilation, respiratory function stable and patient connected to nasal cannula oxygen Cardiovascular status: blood pressure returned to baseline and stable Postop Assessment: no apparent nausea or vomiting Anesthetic complications: no     Last Vitals:  Vitals:   03/13/17 1040 03/13/17 1045  BP:  (!) 149/68  Pulse: 83 81  Resp: 14 14  Temp:    SpO2: 100% 99%    Last Pain:  Vitals:   03/13/17 0818  TempSrc: Tympanic                 Precious Haws Jahdiel Krol

## 2017-03-13 NOTE — Op Note (Signed)
[facility] Gastroenterology Patient Name: Dawn Ford Procedure Date: 03/13/2017 9:58 AM MRN: [id number] Account #: [id number] Date of Birth: May 10, 1945 Admit Type: Outpatient Age: 72 Room: [facility] ENDO ROOM 2 Gender: Female Note Status: Finalized Procedure:            Upper GI endoscopy Indications:          Dysphagia, Heartburn Providers:            Varnita B. Bonna Gains MD, MD Referring MD:         Leonie Douglas. Doy Hutching, MD (Referring MD) Medicines:            Monitored Anesthesia Care Complications:        No immediate complications. Procedure:            Pre-Anesthesia Assessment:                       - The risks and benefits of the procedure and the                        sedation options and risks were discussed with the                        patient. All questions were answered and informed                        consent was obtained.                       - Patient identification and proposed procedure were                        verified prior to the procedure.                       - ASA Grade Assessment: III - A patient with severe                        systemic disease.                       After obtaining informed consent, the endoscope was                        passed under direct vision. Throughout the procedure,                        the patient's blood pressure, pulse, and oxygen                        saturations were monitored continuously. The Endoscope                        was introduced through the mouth, and advanced to the                        second part of duodenum. The upper GI endoscopy was                        accomplished with ease. The patient tolerated the  procedure well. Findings:      A non-obstructing Schatzki ring (acquired) was found at the       gastroesophageal junction. A TTS dilator was passed through the scope.       Dilation with a 15-16.5-18 mm balloon dilator was performed to 18 mm.   (Initial dilation to 40mm did not lead to any resistance. The balloon       was dilated to 16.5 mm and no resistance was noted, the balloon was held       in position for about 1 minute and then deflated. The site was examined       and did not show any heme effect or change. The balloon was then       inflated to 18 mm as dilation to 16.5 mm did not result in heme effect.)       The dilation site was examined and showed no mucosal tear and no       perforation. Mild heme effect was noted post dilation to 65mm.      No other significant abnormalities were identified in a careful       examination of the esophagus.      There is no endoscopic evidence of reflux esophagitis in the entire       esophagus. Biopsies were obtained from the proximal and distal esophagus       with cold forceps for histology of suspected eosinophilic esophagitis.      Patchy atrophic mucosa was found in the gastric antrum. Biopsies were       taken with a cold forceps for histology.      The examined duodenum was normal. Impression:           - Non-obstructing Schatzki ring. Dilated.                       - Gastric mucosal atrophy. Biopsied.                       - Normal examined duodenum. Recommendation:       - Discharge patient to home.                       - Await pathology results.                       - Follow an antireflux regimen.                       - Full liquid diet today.                       - Soft diet for 3 days.                       - Continue present medications.                       - Return to my office in 4 weeks.                       - Return to primary care physician as previously                        scheduled.                       -  The findings and recommendations were discussed with                        the patient.                       - The findings and recommendations were discussed with                        the patient's family. Procedure Code(s):    ---  Professional ---                       [phone], Esophagogastroduodenoscopy, flexible, transoral;                        with transendoscopic balloon dilation of esophagus                        (less than 30 mm diameter)                       43239, Esophagogastroduodenoscopy, flexible, transoral;                        with biopsy, single or multiple Diagnosis Code(s):    --- Professional ---                       K22.2, Esophageal obstruction                       K31.89, Other diseases of stomach and duodenum                       R13.10, Dysphagia, unspecified                       R12, Heartburn CPT copyright 2016 American Medical Association. All rights reserved. The codes documented in this report are preliminary and upon coder review may  be revised to meet current compliance requirements.  Vonda Antigua, MD Margretta Sidle B. Bonna Gains MD, MD 03/13/2017 10:32:26 AM This report has been signed electronically. Number of Addenda: 0 Note Initiated On: 03/13/2017 9:58 AM      [facility]

## 2017-03-13 NOTE — H&P (Signed)
Vonda Antigua, MD [address] [place], [place], [place], [address] [address], [place], [place], [place], [address] Phone: [phone]  Fax: [phone]  Primary Care Physician:  Idelle Crouch, MD   Pre-Procedure History & Physical: HPI:  KHIANA CAMINO is a 72 y.o. female is here for an EGD.   Past Medical History:  Diagnosis Date  . Diabetes mellitus without complication ([place])   . Hyperlipemia   . Hypertension     Past Surgical History:  Procedure Laterality Date  . CORONARY ANGIOPLASTY WITH STENT PLACEMENT    . KNEE ARTHROSCOPY      Prior to Admission medications   Medication Sig Start Date End Date Taking? Authorizing Provider  Blood Glucose Monitoring Suppl (FIFTY50 GLUCOSE METER 2.0) w/Device KIT Use as directed. 05/09/16 05/09/17  [provider]  dicyclomine (BENTYL) 20 MG tablet Take 1 tablet (20 mg total) by mouth 3 (three) times daily as needed for spasms. 03/04/16   Daymon Larsen, MD  famotidine (PEPCID) 40 MG tablet Take 1 tablet (40 mg total) by mouth every evening. 02/13/17   Virgel Manifold, MD  glimepiride (AMARYL) 4 MG tablet Take by mouth. 11/05/16   [provider]  glucose blood (PRECISION QID TEST) test strip Use once daily. Use as instructed. 05/09/16 05/09/17  [provider]  metoprolol tartrate (LOPRESSOR) 50 MG tablet  02/02/17   [provider]  ondansetron (ZOFRAN) 4 MG tablet Take 1 tablet (4 mg total) by mouth every 8 (eight) hours as needed for nausea or vomiting. 03/04/16   Daymon Larsen, MD  [facility] VERIO test strip  02/11/17   [provider]  pioglitazone (ACTOS) 15 MG tablet Take by mouth. 11/05/16   [provider]  ramipril (ALTACE) 10 MG capsule  11/28/16   [provider]  [place] 18 MG/3ML SOPN  02/10/17   [provider]    Allergies as of 02/13/2017  . (No Known Allergies)    No family history on file.  Social History   Socioeconomic History    . Marital status: Single    Spouse name: Not on file  . Number of children: Not on file  . Years of education: Not on file  . Highest education level: Not on file  Social Needs  . Financial resource strain: Not on file  . Food insecurity - worry: Not on file  . Food insecurity - inability: Not on file  . Transportation needs - medical: Not on file  . Transportation needs - non-medical: Not on file  Occupational History  . Not on file  Tobacco Use  . Smoking status: Former Smoker    Last attempt to quit: 2000    Years since quitting: 19.1  . Smokeless tobacco: Never Used  Substance and Sexual Activity  . Alcohol use: No  . Drug use: No  . Sexual activity: Not on file  Other Topics Concern  . Not on file  Social History Narrative  . Not on file    Review of Systems: See HPI, otherwise negative ROS  Physical Exam: There were no vitals taken for this visit. General:   Alert,  pleasant and cooperative in NAD Head:  Normocephalic and atraumatic. Neck:  Supple; no masses or thyromegaly. Lungs:  Clear throughout to auscultation, normal respiratory effort.    Heart:  +S1, +S2, Regular rate and rhythm, No edema. Abdomen:  Soft, nontender and nondistended. Normal bowel sounds, without guarding, and without rebound.   Neurologic:  Alert and  oriented x4;  grossly normal neurologically.  Impression/Plan: Langston L Bayard is here for an EGD for Acid Reflux and dysphagia.  Risks, benefits, limitations, and alternatives regarding the procedure have been reviewed with the patient.  Questions have been answered.  All parties agreeable.   Virgel Manifold, MD  03/13/2017, 8:21 AM

## 2017-03-13 NOTE — Anesthesia Preprocedure Evaluation (Signed)
Anesthesia Evaluation  Patient identified by MRN, date of birth, ID band Patient awake    Reviewed: Allergy & Precautions, H&P , NPO status , Patient's Chart, lab work & pertinent test results  History of Anesthesia Complications Negative for: history of anesthetic complications  Airway Mallampati: III  TM Distance: <3 FB Neck ROM: limited    Dental  (+) Chipped, Poor Dentition, Missing   Pulmonary neg shortness of breath, former smoker,           Cardiovascular Exercise Tolerance: Good hypertension, (-) angina+ CAD and + Cardiac Stents  (-) Past MI and (-) DOE      Neuro/Psych negative neurological ROS  negative psych ROS   GI/Hepatic negative GI ROS, Neg liver ROS, neg GERD  ,  Endo/Other  diabetes, Type 2  Renal/GU negative Renal ROS  negative genitourinary   Musculoskeletal   Abdominal   Peds  Hematology negative hematology ROS (+)   Anesthesia Other Findings Past Medical History: No date: Diabetes mellitus without complication ([facility]) No date: Hyperlipemia No date: Hypertension  Past Surgical History: No date: CORONARY ANGIOPLASTY WITH STENT PLACEMENT No date: KNEE ARTHROSCOPY  BMI    Body Mass Index:  28.19 kg/m      Reproductive/Obstetrics negative OB ROS                             Anesthesia Physical Anesthesia Plan  ASA: III  Anesthesia Plan: General   Post-op Pain Management:    Induction: Intravenous  PONV Risk Score and Plan: Propofol infusion  Airway Management Planned: Natural Airway and Nasal Cannula  Additional Equipment:   Intra-op Plan:   Post-operative Plan:   Informed Consent: I have reviewed the patients History and Physical, chart, labs and discussed the procedure including the risks, benefits and alternatives for the proposed anesthesia with the patient or authorized representative who has indicated his/her understanding and acceptance.    Dental Advisory Given  Plan Discussed with: Anesthesiologist, CRNA and Surgeon  Anesthesia Plan Comments: (Patient consented for risks of anesthesia including but not limited to:  - adverse reactions to medications - risk of intubation if required - damage to teeth, lips or other oral mucosa - sore throat or hoarseness - Damage to heart, brain, lungs or loss of life  Patient voiced understanding.)        Anesthesia Quick Evaluation

## 2017-03-13 NOTE — Anesthesia Post-op Follow-up Note (Signed)
Anesthesia QCDR form completed.        

## 2017-03-13 NOTE — Transfer of Care (Signed)
Immediate Anesthesia Transfer of Care Note  Patient: Dawn Ford  Procedure(s) Performed: ESOPHAGOGASTRODUODENOSCOPY (EGD) WITH PROPOFOL (N/A )  Patient Location: PACU  Anesthesia Type:General  Level of Consciousness: awake and alert   Airway & Oxygen Therapy: Patient Spontanous Breathing  Post-op Assessment: Report given to RN  Post vital signs: Reviewed and stable  Last Vitals:  Vitals:   03/13/17 0818 03/13/17 1023  BP: (!) 163/70 (!) 169/68  Pulse: 71 74  Resp: 16 16  Temp: (!) 35.9 C 36.8 C  SpO2: 100% 98%    Last Pain:  Vitals:   03/13/17 0818  TempSrc: Tympanic         Complications: No apparent anesthesia complications

## 2017-03-14 ENCOUNTER — Encounter: Payer: Self-pay | Admitting: Gastroenterology

## 2017-03-14 LAB — SURGICAL PATHOLOGY

## 2017-03-19 ENCOUNTER — Telehealth: Payer: Self-pay | Admitting: Gastroenterology

## 2017-03-19 NOTE — Telephone Encounter (Signed)
LVM for patient to call and schedule a 4 week follow up with Dr. Bonna Gains.

## 2017-03-20 ENCOUNTER — Encounter: Payer: Self-pay | Admitting: Gastroenterology

## 2017-03-20 ENCOUNTER — Telehealth: Payer: Self-pay | Admitting: Gastroenterology

## 2017-03-20 NOTE — Telephone Encounter (Signed)
LVM for patient to call and schedule a 4 week follow up with Dr. Bonna Gains.  Letter sent

## 2017-03-26 ENCOUNTER — Telehealth: Payer: Self-pay | Admitting: Gastroenterology

## 2017-03-26 ENCOUNTER — Encounter: Payer: Self-pay | Admitting: Gastroenterology

## 2017-03-26 NOTE — Telephone Encounter (Signed)
LVM for patient to call and reschedule her 4 week follow up with Dr. Bonna Gains.  Letter sent

## 2017-04-18 ENCOUNTER — Ambulatory Visit: Payer: Medicare Other | Admitting: Gastroenterology

## 2017-04-22 ENCOUNTER — Ambulatory Visit: Payer: Medicare Other | Admitting: Gastroenterology

## 2017-05-08 ENCOUNTER — Other Ambulatory Visit: Payer: Self-pay | Admitting: Gastroenterology

## 2017-05-09 NOTE — Telephone Encounter (Signed)
appt on 05/13/17. Unsure whether you wanted this refilled.

## 2017-05-13 ENCOUNTER — Other Ambulatory Visit: Payer: Self-pay

## 2017-05-13 ENCOUNTER — Encounter: Payer: Self-pay | Admitting: Gastroenterology

## 2017-05-13 ENCOUNTER — Ambulatory Visit: Payer: Medicare Other | Admitting: Gastroenterology

## 2017-05-13 VITALS — BP 132/71 | HR 67 | Temp 98.1°F | Ht 67.0 in | Wt 187.8 lb

## 2017-05-13 DIAGNOSIS — K222 Esophageal obstruction: Secondary | ICD-10-CM | POA: Diagnosis not present

## 2017-05-13 MED ORDER — FAMOTIDINE 20 MG PO TABS: 20.0000 mg | ORAL_TABLET | Freq: Every day | ORAL | 3 refills | Status: DC

## 2017-05-13 NOTE — Patient Instructions (Signed)
F/U 1 YEAR

## 2017-05-13 NOTE — Progress Notes (Signed)
Vonda Antigua, MD [address]  [place]  [place], [place] [address]  Main: [phone]  Fax: [phone]   Primary Care Physician: Idelle Crouch, MD  Primary Gastroenterologist:  Dr. Vonda Antigua  Chief Complaint  Patient presents with  . Follow-up    gerd, foreign body sensation    HPI: Dawn Ford is a 72 y.o. female here for follow-up of globus sensation and dysphagia.  Symptoms have completely resolved since starting once daily Pepcid in March 2019 after her last EGD.  Her EGD showed nonobstructing Schatzki's ring in the distal esophagus, that was dilated to 18 mm with good heme effect.  Patient states as long as she takes her Pepcid daily, she does not have any further dysphagia or globus sensation.  She tried not taking it for 1 day, and symptoms recur, so she has been taking it daily since then.  She has also tried to institute acid reflux lifestyle modification's.  No abdominal pain, no weight loss, no food impactions, no dysphagia at this time.  Current Outpatient Medications  Medication Sig Dispense Refill  . dicyclomine (BENTYL) 20 MG tablet Take 1 tablet (20 mg total) by mouth 3 (three) times daily as needed for spasms. 30 tablet 0  . glimepiride (AMARYL) 4 MG tablet Take by mouth.    . metoprolol tartrate (LOPRESSOR) 50 MG tablet     . ondansetron (ZOFRAN) 4 MG tablet Take 1 tablet (4 mg total) by mouth every 8 (eight) hours as needed for nausea or vomiting. 21 tablet 0  . ONETOUCH VERIO test strip     . pioglitazone (ACTOS) 15 MG tablet Take by mouth.    . ramipril (ALTACE) 10 MG capsule     . VICTOZA 18 MG/3ML SOPN     . famotidine (PEPCID) 20 MG tablet Take 1 tablet (20 mg total) by mouth daily. 90 tablet 3   No current facility-administered medications for this visit.     Allergies as of 05/13/2017  . (No Known Allergies)    ROS:  General: Negative for anorexia, weight loss, fever, chills, fatigue, weakness. ENT: Negative for  hoarseness, difficulty swallowing , nasal congestion. CV: Negative for chest pain, angina, palpitations, dyspnea on exertion, peripheral edema.  Respiratory: Negative for dyspnea at rest, dyspnea on exertion, cough, sputum, wheezing.  GI: See history of present illness. GU:  Negative for dysuria, hematuria, urinary incontinence, urinary frequency, nocturnal urination.  Endo: Negative for unusual weight change.    Physical Examination:   BP 132/71   Pulse 67   Temp 98.1 F (36.7 C) (Oral)   Ht 5\' 7"  (1.702 m)   Wt 187 lb 12.8 oz (85.2 kg)   BMI 29.41 kg/m   General: Well-nourished, well-developed in no acute distress.  Eyes: No icterus. Conjunctivae pink. Mouth: Oropharyngeal mucosa moist and pink , no lesions erythema or exudate. Neck: Supple, Trachea midline Abdomen: Bowel sounds are normal, nontender, nondistended, no hepatosplenomegaly or masses, no abdominal bruits or hernia , no rebound or guarding.   Extremities: No lower extremity edema. No clubbing or deformities. Neuro: Alert and oriented x 3.  Grossly intact. Skin: Warm and dry, no jaundice.   Psych: Alert and cooperative, normal mood and affect.   Labs: CMP     Component Value Date/Time   NA 136 12/13/2016 0834   K 3.5 12/13/2016 0834   K 3.8 10/07/2012 1128   CL 101 12/13/2016 0834   CO2 26 12/13/2016 0834   GLUCOSE 161 (H) 12/13/2016  0834   BUN 13 12/13/2016 0834   CREATININE 0.89 12/13/2016 0834   CALCIUM 9.4 12/13/2016 0834   PROT 8.1 12/13/2016 0834   ALBUMIN 3.8 12/13/2016 0834   AST 24 12/13/2016 0834   ALT 17 12/13/2016 0834   ALKPHOS 74 12/13/2016 0834   BILITOT 0.6 12/13/2016 0834   GFRNONAA >60 12/13/2016 0834   GFRAA >60 12/13/2016 0834   Lab Results  Component Value Date   WBC 7.1 12/13/2016   HGB 11.8 (L) 12/13/2016   HCT 35.9 12/13/2016   MCV 89.2 12/13/2016   PLT 229 12/13/2016    Imaging Studies: No results found.  Assessment and Plan:   LOVETTA CONDIE is a 72 y.o. y/o female  here for follow-up of globus sensation dysphagia, that has resolved with once daily Pepcid, and EGD showed nonobstructing Schatzki's ring that was dilated to 18 mm in March 2019  Will decrease Pepcid to 20 mg daily, his symptoms are well controlled with only 40 mg daily Continue acid reflux lifestyle modification No indication for repeat EGD at this time, symptoms are resolved If symptoms recur, can consider repeat EGD for repeat dilation or increasing Pepcid PPI not indicated at this time, since H2RA is controlling symptoms   Dr Vonda Antigua

## 2017-05-29 ENCOUNTER — Other Ambulatory Visit: Payer: Self-pay | Admitting: Internal Medicine

## 2017-05-29 DIAGNOSIS — Z1231 Encounter for screening mammogram for malignant neoplasm of breast: Secondary | ICD-10-CM

## 2017-06-27 ENCOUNTER — Ambulatory Visit
Admission: RE | Admit: 2017-06-27 | Discharge: 2017-06-27 | Disposition: A | Discharge status: Home / Self Care | Payer: Medicare Other | Source: Ambulatory Visit | Attending: Internal Medicine | Admitting: Internal Medicine

## 2017-06-27 DIAGNOSIS — Z1231 Encounter for screening mammogram for malignant neoplasm of breast: Secondary | ICD-10-CM | POA: Diagnosis present

## 2017-07-01 ENCOUNTER — Telehealth: Payer: Self-pay | Admitting: Gastroenterology

## 2017-07-01 NOTE — Telephone Encounter (Signed)
Pt is calling she states  She was given rx famotidine 20 mg and she was told to call if that did not work she been taking  2 pills to make it 40 mg and that seems to work she would like rx called in for 40 mg  To [facility] in  [place]. She is currently completely out

## 2017-07-01 NOTE — Telephone Encounter (Signed)
Prescription dosage change and med refill

## 2017-07-02 MED ORDER — FAMOTIDINE 20 MG PO TABS: 20.0000 mg | ORAL_TABLET | Freq: Two times a day (BID) | ORAL | 1 refills | Status: DC

## 2017-07-02 NOTE — Addendum Note (Signed)
Addended by: Earl Lagos on: 07/02/2017 05:00 PM   Modules accepted: Orders

## 2017-07-02 NOTE — Telephone Encounter (Signed)
Pt will take Pepcid 20mg  2xd as per Dr Bonna Gains. If this does not help she may need a PPI. Pt understands.

## 2017-11-06 ENCOUNTER — Other Ambulatory Visit: Payer: Self-pay | Admitting: Internal Medicine

## 2017-11-06 DIAGNOSIS — R270 Ataxia, unspecified: Secondary | ICD-10-CM

## 2017-11-19 ENCOUNTER — Ambulatory Visit
Admission: RE | Admit: 2017-11-19 | Discharge: 2017-11-19 | Disposition: A | Discharge status: Home / Self Care | Payer: Medicare Other | Source: Ambulatory Visit | Attending: Internal Medicine | Admitting: Internal Medicine

## 2017-11-19 DIAGNOSIS — R270 Ataxia, unspecified: Secondary | ICD-10-CM | POA: Insufficient documentation

## 2017-12-26 ENCOUNTER — Other Ambulatory Visit: Payer: Self-pay | Admitting: Gastroenterology

## 2018-03-24 ENCOUNTER — Other Ambulatory Visit: Payer: Self-pay | Admitting: Gastroenterology

## 2018-03-26 NOTE — Telephone Encounter (Signed)
Pt is calling for rx refill Rx Famotidine 20 mg to [facility] in [place]

## 2018-05-12 ENCOUNTER — Other Ambulatory Visit: Payer: Self-pay | Admitting: Internal Medicine

## 2018-05-12 DIAGNOSIS — Z1231 Encounter for screening mammogram for malignant neoplasm of breast: Secondary | ICD-10-CM

## 2018-06-30 ENCOUNTER — Other Ambulatory Visit: Payer: Self-pay | Admitting: Gastroenterology

## 2018-07-17 ENCOUNTER — Ambulatory Visit
Admission: RE | Admit: 2018-07-17 | Discharge: 2018-07-17 | Disposition: A | Discharge status: Home / Self Care | Payer: Medicare Other | Source: Ambulatory Visit | Attending: Internal Medicine | Admitting: Internal Medicine

## 2018-07-17 ENCOUNTER — Other Ambulatory Visit: Payer: Self-pay

## 2018-07-17 DIAGNOSIS — Z1231 Encounter for screening mammogram for malignant neoplasm of breast: Secondary | ICD-10-CM | POA: Diagnosis present

## 2018-08-25 ENCOUNTER — Other Ambulatory Visit: Payer: Self-pay | Admitting: Gastroenterology

## 2018-08-28 ENCOUNTER — Other Ambulatory Visit: Payer: Self-pay

## 2018-08-28 ENCOUNTER — Other Ambulatory Visit
Admission: RE | Admit: 2018-08-28 | Discharge: 2018-08-28 | Disposition: A | Discharge status: Home / Self Care | Payer: Medicare Other | Source: Ambulatory Visit | Attending: Internal Medicine | Admitting: Internal Medicine

## 2018-08-28 DIAGNOSIS — Z20828 Contact with and (suspected) exposure to other viral communicable diseases: Secondary | ICD-10-CM | POA: Diagnosis not present

## 2018-08-28 DIAGNOSIS — Z01812 Encounter for preprocedural laboratory examination: Secondary | ICD-10-CM | POA: Diagnosis not present

## 2018-08-28 LAB — SARS CORONAVIRUS 2 (TAT 6-24 HRS): SARS Coronavirus 2: NEGATIVE

## 2018-08-28 NOTE — Telephone Encounter (Signed)
PLEASE REVIEW.

## 2018-08-29 ENCOUNTER — Encounter: Payer: Self-pay | Admitting: Emergency Medicine

## 2018-09-01 ENCOUNTER — Ambulatory Visit: Payer: Medicare Other | Admitting: Anesthesiology

## 2018-09-01 ENCOUNTER — Encounter
Admission: RE | Disposition: A | Discharge status: Home / Self Care | Payer: Self-pay | Source: Home / Self Care | Attending: Internal Medicine

## 2018-09-01 ENCOUNTER — Encounter: Payer: Self-pay | Admitting: *Deleted

## 2018-09-01 ENCOUNTER — Other Ambulatory Visit: Payer: Self-pay

## 2018-09-01 ENCOUNTER — Ambulatory Visit
Admission: RE | Admit: 2018-09-01 | Discharge: 2018-09-01 | Disposition: A | Discharge status: Home / Self Care | Payer: Medicare Other | Attending: Internal Medicine | Admitting: Internal Medicine

## 2018-09-01 DIAGNOSIS — Z7982 Long term (current) use of aspirin: Secondary | ICD-10-CM | POA: Diagnosis not present

## 2018-09-01 DIAGNOSIS — E119 Type 2 diabetes mellitus without complications: Secondary | ICD-10-CM | POA: Diagnosis not present

## 2018-09-01 DIAGNOSIS — E785 Hyperlipidemia, unspecified: Secondary | ICD-10-CM | POA: Insufficient documentation

## 2018-09-01 DIAGNOSIS — Z1211 Encounter for screening for malignant neoplasm of colon: Secondary | ICD-10-CM | POA: Diagnosis present

## 2018-09-01 DIAGNOSIS — Z7984 Long term (current) use of oral hypoglycemic drugs: Secondary | ICD-10-CM | POA: Insufficient documentation

## 2018-09-01 DIAGNOSIS — K219 Gastro-esophageal reflux disease without esophagitis: Secondary | ICD-10-CM | POA: Diagnosis not present

## 2018-09-01 DIAGNOSIS — Z955 Presence of coronary angioplasty implant and graft: Secondary | ICD-10-CM | POA: Diagnosis not present

## 2018-09-01 DIAGNOSIS — Z79899 Other long term (current) drug therapy: Secondary | ICD-10-CM | POA: Insufficient documentation

## 2018-09-01 DIAGNOSIS — Z8371 Family history of colonic polyps: Secondary | ICD-10-CM | POA: Diagnosis not present

## 2018-09-01 DIAGNOSIS — I10 Essential (primary) hypertension: Secondary | ICD-10-CM | POA: Diagnosis not present

## 2018-09-01 DIAGNOSIS — I2510 Atherosclerotic heart disease of native coronary artery without angina pectoris: Secondary | ICD-10-CM | POA: Insufficient documentation

## 2018-09-01 DIAGNOSIS — Z87891 Personal history of nicotine dependence: Secondary | ICD-10-CM | POA: Diagnosis not present

## 2018-09-01 HISTORY — DX: Occlusion and stenosis of bilateral carotid arteries: I65.23

## 2018-09-01 HISTORY — DX: Atherosclerotic heart disease of native coronary artery without angina pectoris: I25.10

## 2018-09-01 HISTORY — PX: COLONOSCOPY WITH PROPOFOL: SHX5780

## 2018-09-01 LAB — GLUCOSE, CAPILLARY: Glucose-Capillary: 91 mg/dL (ref 70–99)

## 2018-09-01 SURGERY — COLONOSCOPY WITH PROPOFOL
Anesthesia: General

## 2018-09-01 MED ORDER — PROPOFOL 10 MG/ML IV BOLUS
INTRAVENOUS | Status: AC
  Filled 2018-09-01: qty 40

## 2018-09-01 MED ORDER — SODIUM CHLORIDE 0.9 % IV SOLN
INTRAVENOUS | Status: DC
  Administered 2018-09-01: 07:00:00 via INTRAVENOUS

## 2018-09-01 MED ORDER — PROPOFOL 10 MG/ML IV BOLUS
INTRAVENOUS | Status: DC | PRN
  Administered 2018-09-01 (×3): 20 mg via INTRAVENOUS
  Administered 2018-09-01: 70 mg via INTRAVENOUS

## 2018-09-01 NOTE — Op Note (Signed)
[facility] Gastroenterology Patient Name: Dawn Ford Procedure Date: 09/01/2018 7:24 AM MRN: [id number] Account #: [id number] Date of Birth: 07-09-1945 Admit Type: Outpatient Age: 73 Room: [facility] ENDO ROOM 1 Gender: Female Note Status: Finalized Procedure:            Colonoscopy Indications:          Colon cancer screening in patient at increased risk:                        Family history of 1st-degree relative with colon polyps Providers:            Benay Pike. [place] MD, MD Medicines:            Propofol per Anesthesia Complications:        No immediate complications. Procedure:            Pre-Anesthesia Assessment:                       - Prior to the procedure, a History and Physical was                        performed, and patient medications, allergies and                        sensitivities were reviewed. The patient's tolerance of                        previous anesthesia was reviewed.                       - The risks and benefits of the procedure and the                        sedation options and risks were discussed with the                        patient. All questions were answered and informed                        consent was obtained.                       - Patient identification and proposed procedure were                        verified prior to the procedure by the nurse. The                        procedure was verified in the procedure room.                       - ASA Grade Assessment: III - A patient with severe                        systemic disease.                       - After reviewing the risks and benefits, the patient                        was deemed in satisfactory  condition to undergo the                        procedure.                       After obtaining informed consent, the colonoscope was                        passed under direct vision. Throughout the procedure,                        the patient's blood pressure,  pulse, and oxygen                        saturations were monitored continuously. The                        Colonoscope was introduced through the anus and                        advanced to the the cecum, identified by appendiceal                        orifice and ileocecal valve. The colonoscopy was                        performed without difficulty. The patient tolerated the                        procedure well. The quality of the bowel preparation                        was excellent. The ileocecal valve, appendiceal                        orifice, and rectum were photographed. Findings:      The perianal and digital rectal examinations were normal. Pertinent       negatives include normal sphincter tone and no palpable rectal lesions.      The colon (entire examined portion) appeared normal.      The exam was otherwise without abnormality on direct and retroflexion       views. Impression:           - The entire examined colon is normal.                       - The examination was otherwise normal on direct and                        retroflexion views.                       - No specimens collected. Recommendation:       - Patient has a contact number available for                        emergencies. The signs and symptoms of potential                        delayed complications were discussed with the patient.  Return to normal activities tomorrow. Written discharge                        instructions were provided to the patient.                       - Resume previous diet.                       - Continue present medications.                       - No repeat colonoscopy due to current age (33 years or                        older), the absence of colonic polyps and no evidence                        of mucosal or other abnormalities on today's exam.                       - Return to GI office PRN. Procedure Code(s):    --- Professional ---                        [id number], Colorectal cancer screening; colonoscopy on                        individual at high risk Diagnosis Code(s):    --- Professional ---                       Z83.71, Family history of colonic polyps CPT copyright 2019 American Medical Association. All rights reserved. The codes documented in this report are preliminary and upon coder review may  be revised to meet current compliance requirements. Efrain Sella MD, MD 09/01/2018 8:33:44 AM This report has been signed electronically. Number of Addenda: 0 Note Initiated On: 09/01/2018 7:24 AM Scope Withdrawal Time: 0 hours 6 minutes 10 seconds  Total Procedure Duration: 0 hours 9 minutes 15 seconds  Estimated Blood Loss: Estimated blood loss: none.      [facility]

## 2018-09-01 NOTE — Anesthesia Preprocedure Evaluation (Addendum)
Anesthesia Evaluation  Patient identified by MRN, date of birth, ID band Patient awake    Reviewed: Allergy & Precautions, H&P , NPO status , Patient's Chart, lab work & pertinent test results  Airway Mallampati: III  TM Distance: >3 FB     Dental  (+) Chipped   Pulmonary neg shortness of breath, neg COPD, neg recent URI, former smoker,           Cardiovascular hypertension, (-) angina+ CAD and + Cardiac Stents (2000)  (-) CABG (-) dysrhythmias      Neuro/Psych negative neurological ROS  negative psych ROS   GI/Hepatic Neg liver ROS, GERD  Controlled,  Endo/Other  diabetes  Renal/GU negative Renal ROS  negative genitourinary   Musculoskeletal   Abdominal   Peds  Hematology negative hematology ROS (+)   Anesthesia Other Findings Past Medical History: No date: Bilateral carotid artery stenosis No date: Coronary artery disease No date: Diabetes mellitus without complication (HCC) No date: Hyperlipemia No date: Hypertension  Past Surgical History: No date: CORONARY ANGIOPLASTY WITH STENT PLACEMENT 03/13/2017: ESOPHAGOGASTRODUODENOSCOPY (EGD) WITH PROPOFOL; N/A     Comment:  Procedure: ESOPHAGOGASTRODUODENOSCOPY (EGD) WITH               PROPOFOL;  Surgeon: Virgel Manifold, MD;  Location:               [facility] ENDOSCOPY;  Service: Endoscopy;  Laterality: N/A; No date: KNEE ARTHROSCOPY  BMI    Body Mass Index: 29.44 kg/m      Reproductive/Obstetrics negative OB ROS                            Anesthesia Physical Anesthesia Plan  ASA: II  Anesthesia Plan: General   Post-op Pain Management:    Induction:   PONV Risk Score and Plan: Propofol infusion and TIVA  Airway Management Planned:   Additional Equipment:   Intra-op Plan:   Post-operative Plan:   Informed Consent: I have reviewed the patients History and Physical, chart, labs and discussed the procedure including the  risks, benefits and alternatives for the proposed anesthesia with the patient or authorized representative who has indicated his/her understanding and acceptance.     Dental Advisory Given  Plan Discussed with: Anesthesiologist and CRNA  Anesthesia Plan Comments:         Anesthesia Quick Evaluation

## 2018-09-01 NOTE — Anesthesia Postprocedure Evaluation (Signed)
Anesthesia Post Note  Patient: [name]  Procedure(s) Performed: COLONOSCOPY WITH PROPOFOL (N/A )  Patient location during evaluation: PACU Anesthesia Type: General Level of consciousness: awake and alert Pain management: pain level controlled Vital Signs Assessment: post-procedure vital signs reviewed and stable Respiratory status: spontaneous breathing, nonlabored ventilation and respiratory function stable Cardiovascular status: blood pressure returned to baseline and stable Postop Assessment: no apparent nausea or vomiting Anesthetic complications: no     Last Vitals:  Vitals:   09/01/18 0856 09/01/18 0906  BP: (!) 157/62 (!) 157/62  Pulse:    Resp:    Temp:    SpO2:      Last Pain:  Vitals:   09/01/18 0856  TempSrc:   PainSc: 0-No pain                 Durenda Hurt

## 2018-09-01 NOTE — Transfer of Care (Signed)
Immediate Anesthesia Transfer of Care Note  Patient: [name]  Procedure(s) Performed: COLONOSCOPY WITH PROPOFOL (N/A )  Patient Location: Endoscopy Unit  Anesthesia Type:General  Level of Consciousness: awake, alert  and oriented  Airway & Oxygen Therapy: Patient Spontanous Breathing  Post-op Assessment: Report given to RN and Post -op Vital signs reviewed and stable  Post vital signs: Reviewed and stable  Last Vitals:  Vitals Value Taken Time  BP    Temp    Pulse    Resp    SpO2      Last Pain:  Vitals:   09/01/18 0657  TempSrc: Tympanic  PainSc: 0-No pain         Complications: No apparent anesthesia complications

## 2018-09-01 NOTE — Anesthesia Post-op Follow-up Note (Signed)
Anesthesia QCDR form completed.        

## 2018-09-01 NOTE — Interval H&P Note (Signed)
History and Physical Interval Note:  09/01/2018 8:13 AM  Dawn Ford  has presented today for surgery, with the diagnosis of FAMILY HX.OF COLON POLYPS.  The various methods of treatment have been discussed with the patient and family. After consideration of risks, benefits and other options for treatment, the patient has consented to  Procedure(s): COLONOSCOPY WITH PROPOFOL (N/A) as a surgical intervention.  The patient's history has been reviewed, patient examined, no change in status, stable for surgery.  I have reviewed the patient's chart and labs.  Questions were answered to the patient's satisfaction.     [place], Haivana Nakya

## 2018-09-01 NOTE — H&P (Signed)
Outpatient short stay form Pre-procedure 09/01/2018 8:12 AM Dawn Ford, M.D.  Primary Physician: [name], M.D.  Reason for visit:  Family hx of colon polyps (Sister).  History of present illness: 73 year old patient presenting for family history of colon polyps. Patient denies any change in bowel habits, rectal bleeding or involuntary weight loss.   Current Facility-Administered Medications:  .  0.9 %  sodium chloride infusion, , Intravenous, Continuous, [place], Benay Pike, MD, Last Rate: 20 mL/hr at 09/01/18 [id number]  Medications Prior to Admission  Medication Sig Dispense Refill Last Dose  . aspirin EC 81 MG tablet Take 81 mg by mouth daily.   08/21/2018 at Unknown time  . dicyclomine (BENTYL) 20 MG tablet Take 1 tablet (20 mg total) by mouth 3 (three) times daily as needed for spasms. 30 tablet 0 Past Month at Unknown time  . EQ FAMOTIDINE MAX ST 20 MG tablet Take 1 tablet by mouth twice daily 90 tablet 0 09/01/2018 at 0500  . glimepiride (AMARYL) 4 MG tablet Take by mouth.   Past Week at Unknown time  . hydrochlorothiazide (HYDRODIURIL) 25 MG tablet Take 25 mg by mouth daily.   09/01/2018 at 0500  . metoprolol tartrate (LOPRESSOR) 50 MG tablet    09/01/2018 at 0500  . ondansetron (ZOFRAN) 4 MG tablet Take 1 tablet (4 mg total) by mouth every 8 (eight) hours as needed for nausea or vomiting. 21 tablet 0 Past Week at Unknown time  . ONETOUCH VERIO test strip    09/01/2018 at Unknown time  . pioglitazone (ACTOS) 15 MG tablet Take by mouth.   Past Week at Unknown time  . ramipril (ALTACE) 10 MG capsule    09/01/2018 at 0500  . simvastatin (ZOCOR) 40 MG tablet Take 40 mg by mouth daily.   09/01/2018 at 0500  . VICTOZA 18 MG/3ML SOPN    Past Week at Unknown time     No Known Allergies   Past Medical History:  Diagnosis Date  . Bilateral carotid artery stenosis   . Coronary artery disease   . Diabetes mellitus without complication ([place])   . Hyperlipemia   . Hypertension      Review of systems:  Otherwise negative.    Physical Exam  Gen: Alert, oriented. Appears stated age.  HEENT: [place]/AT. PERRLA. Lungs: CTA, no wheezes. CV: RR nl S1, S2. Abd: soft, benign, no masses. BS+ Ext: No edema. Pulses 2+    Planned procedures: Proceed with colonoscopy. The patient understands the nature of the planned procedure, indications, risks, alternatives and potential complications including but not limited to bleeding, infection, perforation, damage to internal organs and possible oversedation/side effects from anesthesia. The patient agrees and gives consent to proceed.  Please refer to procedure notes for findings, recommendations and patient disposition/instructions.     Dawn Ford, M.D. Gastroenterology 09/01/2018  8:12 AM    .to

## 2018-09-02 ENCOUNTER — Encounter: Payer: Self-pay | Admitting: Internal Medicine

## 2019-05-19 ENCOUNTER — Other Ambulatory Visit: Payer: Self-pay | Admitting: Internal Medicine

## 2019-05-19 DIAGNOSIS — Z1231 Encounter for screening mammogram for malignant neoplasm of breast: Secondary | ICD-10-CM

## 2019-06-21 IMAGING — CT CT HEAD W/O CM
3 series · 15 of 47 positions shown, 18 images · non-contrast
Comparison: None.

CLINICAL DATA: 72-year-old female has fallen twice, last year and
recently 2 weeks ago when she passed out. Initial encounter.

EXAM:
CT HEAD WITHOUT CONTRAST
TECHNIQUE: Contiguous axial images were obtained from the base of the skull
through the vertex without intravenous contrast.

[Series 2: head wo · axial · 0.47mm/px · z∈[-64,+61]mm · 9 of 30 slices shown, 12 images]
[im 3/30  brain]
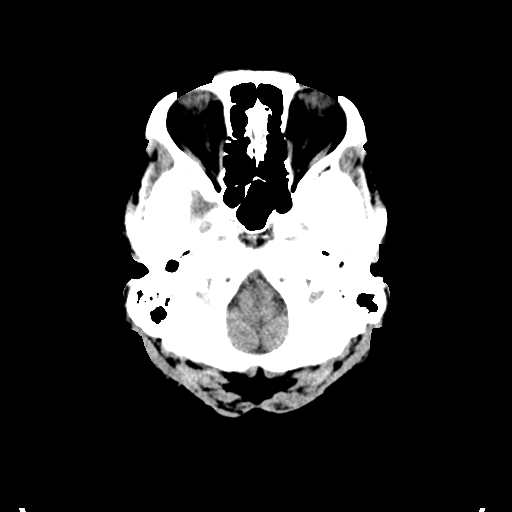
[im 3/30  bone]
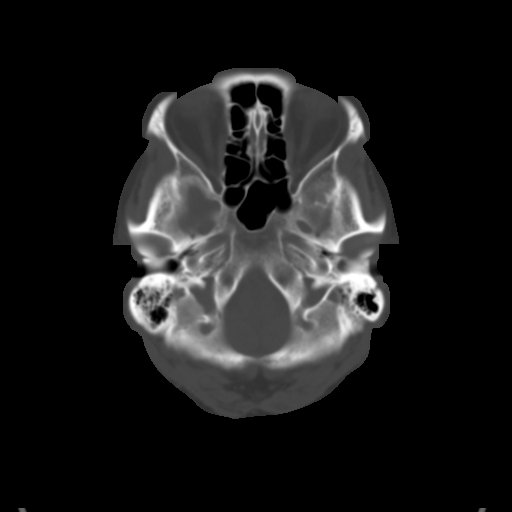
[im 6/30  brain]
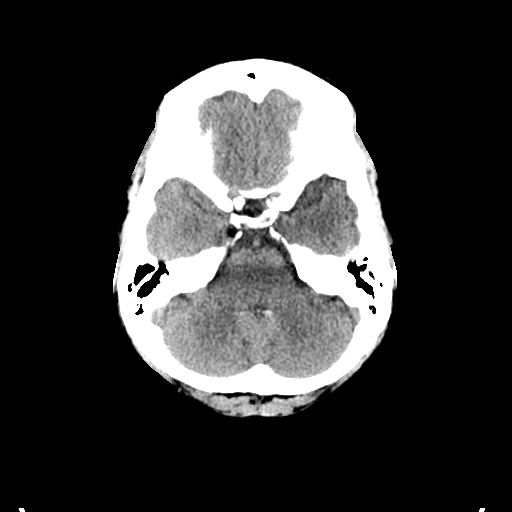
[im 9/30  brain]
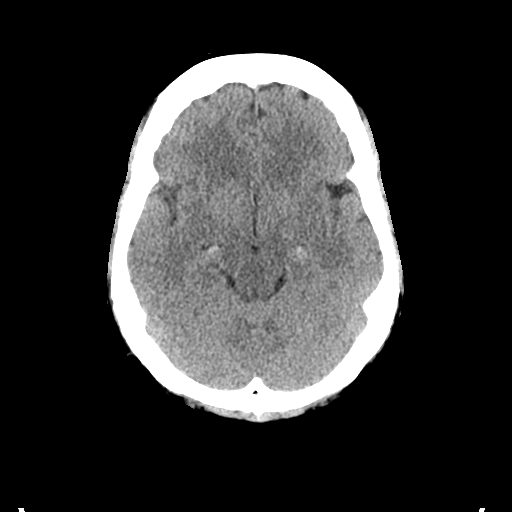
[im 12/30  brain]
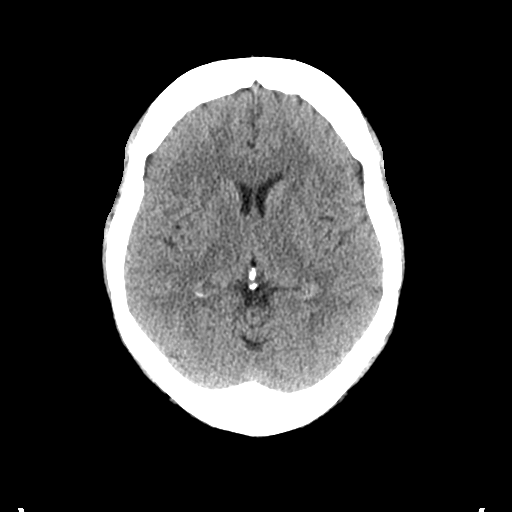
[im 16/30  brain]
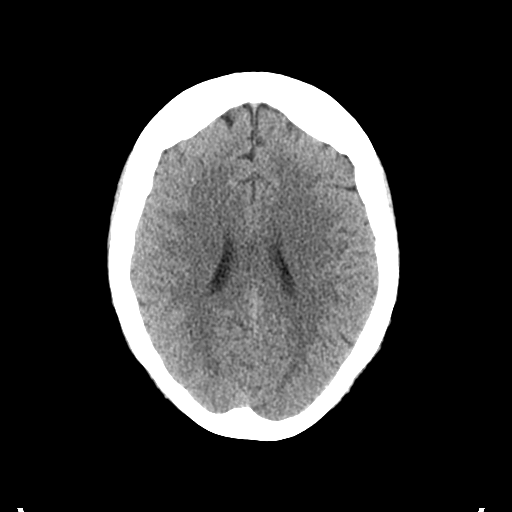
[im 16/30  bone]
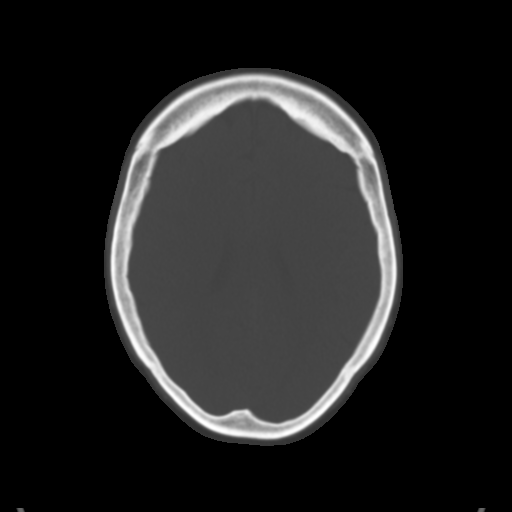
[im 19/30  brain]
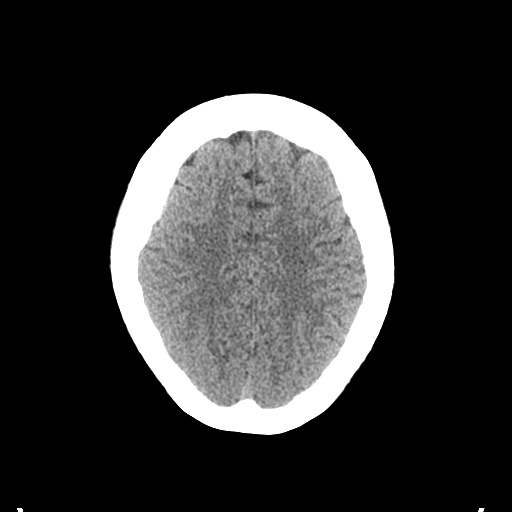
[im 22/30  brain]
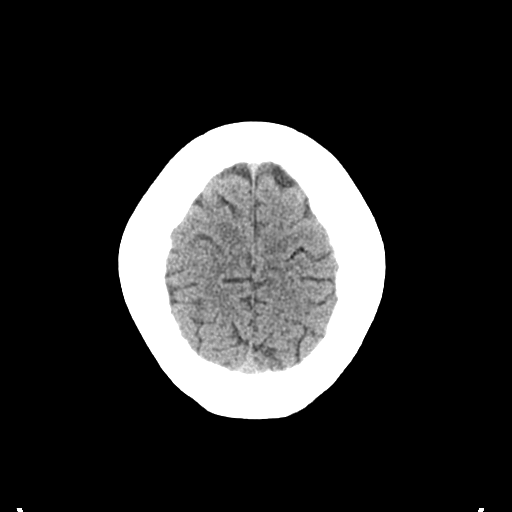
[im 25/30  brain]
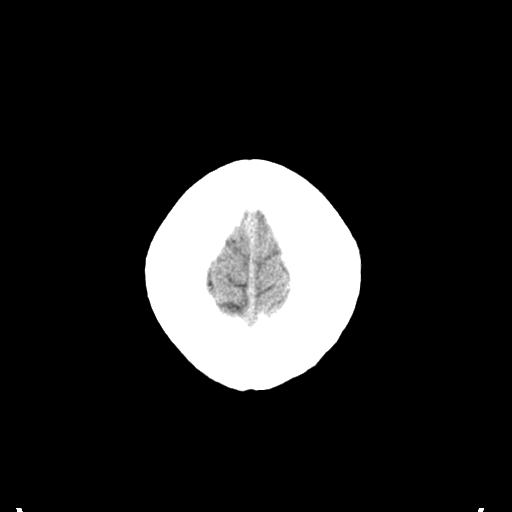
[im 28/30  brain]
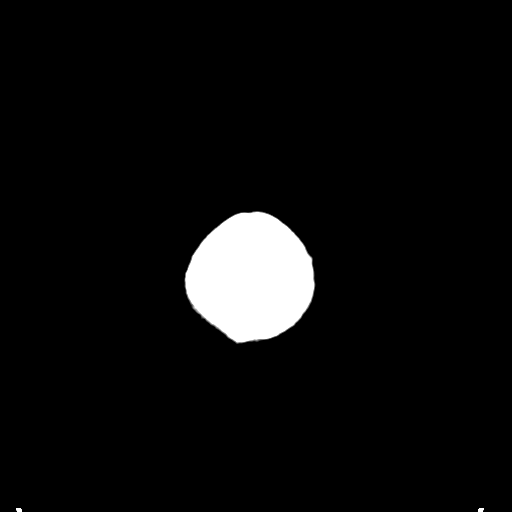
[im 28/30  bone]
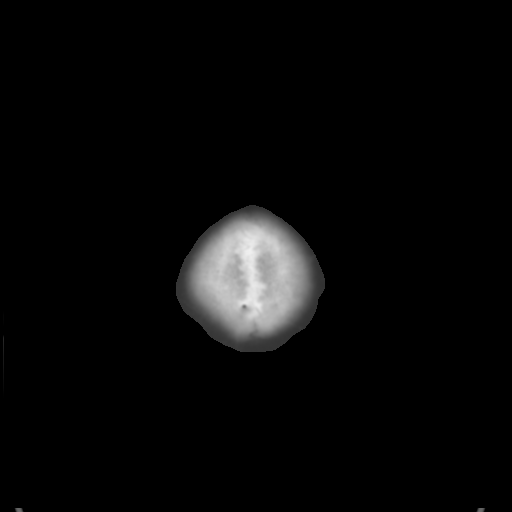

[Series 4: coronal soft tissue · coronal · 0.30mm/px · 3 of 62 slices shown]
[im 21/62  brain]
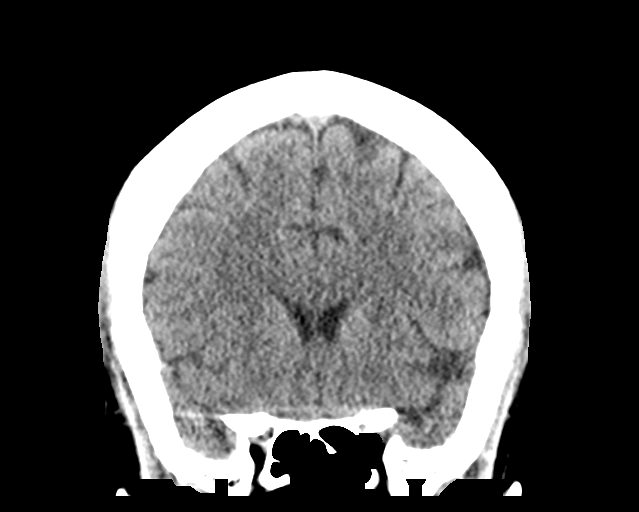
[im 28/62  brain]
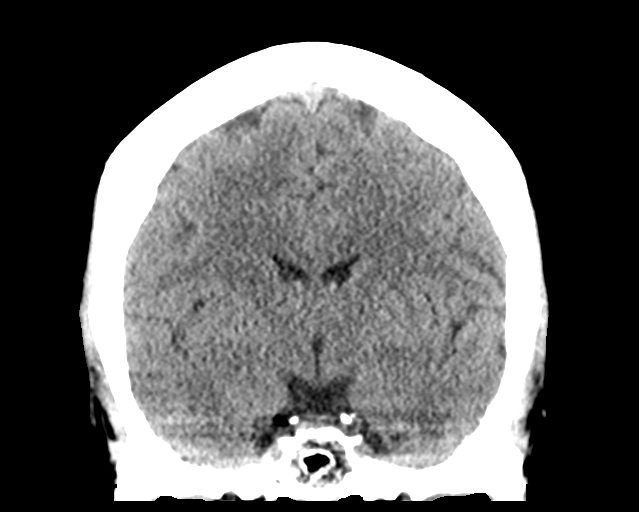
[im 34/62  brain]
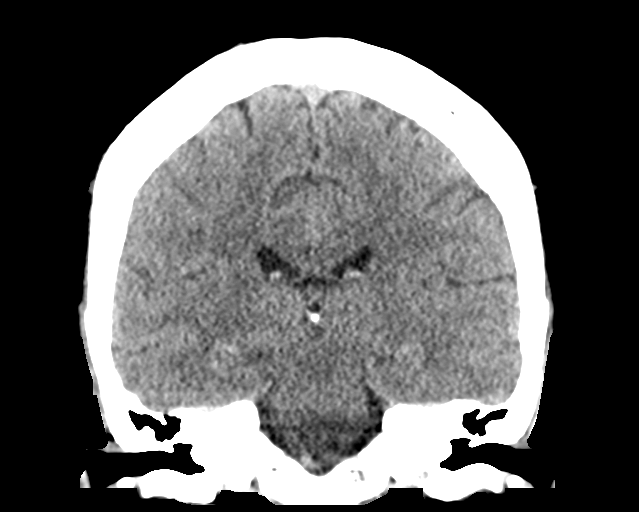

[Series 5: sagittal soft tissue · sagittal · 0.31mm/px · 3 of 50 slices shown]
[im 17/50  brain]
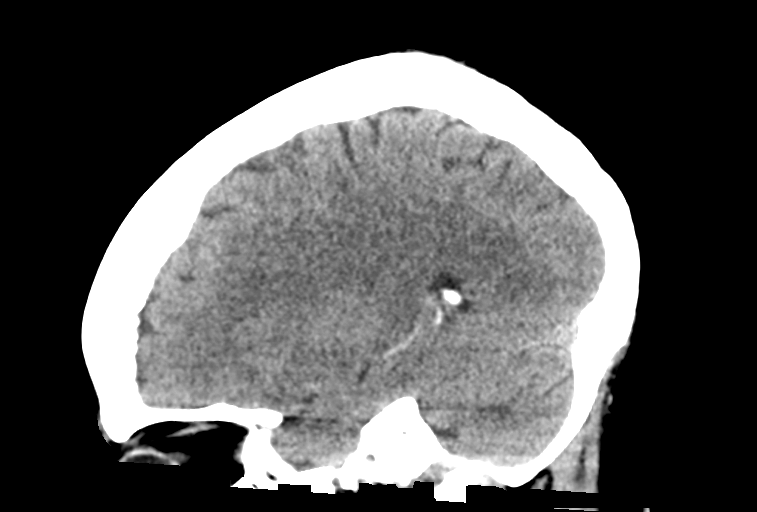
[im 25/50  brain]
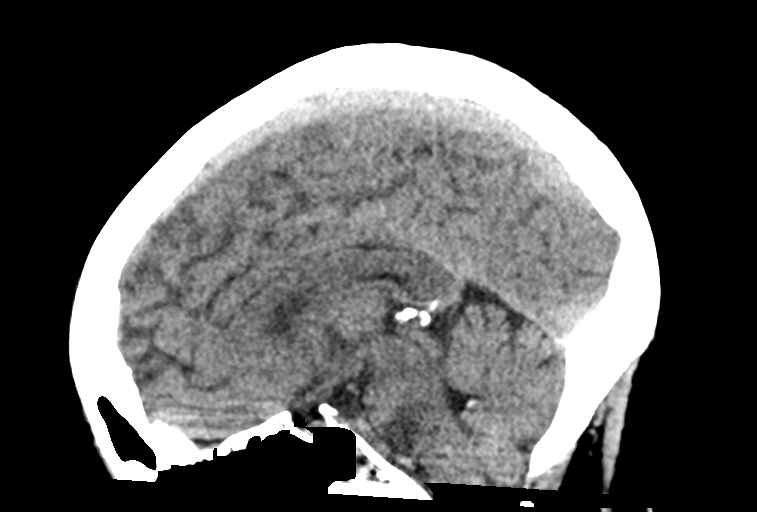
[im 33/50  brain]
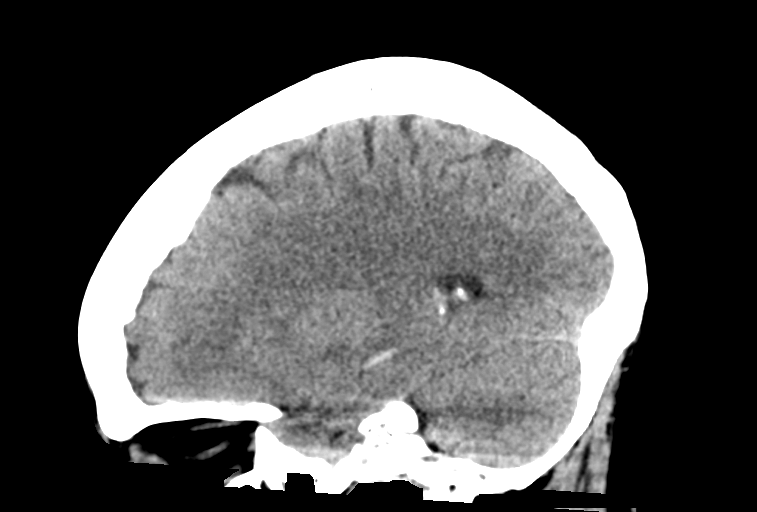

[15 of 47 positions shown; findings below may reference images not displayed]

FINDINGS: Brain: No intracranial hemorrhage or CT evidence of large acute
infarct. No intracranial mass lesion noted on this unenhanced exam.
Partially empty sella.

Vascular: Cavernous segment carotid artery calcification.

Skull: Negative

Sinuses/Orbits: Visualized aspects of the orbits unremarkable.
Visualized paranasal sinuses clear.

Other: Mastoid air cells and middle ear cavities are clear.
IMPRESSION: No acute intracranial abnormality noted.

## 2019-07-21 ENCOUNTER — Ambulatory Visit
Admission: RE | Admit: 2019-07-21 | Discharge: 2019-07-21 | Disposition: A | Discharge status: Home / Self Care | Payer: Medicare Other | Source: Ambulatory Visit | Attending: Internal Medicine | Admitting: Internal Medicine

## 2019-07-21 DIAGNOSIS — Z1231 Encounter for screening mammogram for malignant neoplasm of breast: Secondary | ICD-10-CM

## 2020-05-25 ENCOUNTER — Other Ambulatory Visit: Payer: Self-pay | Admitting: Internal Medicine

## 2020-05-25 DIAGNOSIS — Z1231 Encounter for screening mammogram for malignant neoplasm of breast: Secondary | ICD-10-CM

## 2020-07-21 ENCOUNTER — Ambulatory Visit
Admission: RE | Admit: 2020-07-21 | Discharge: 2020-07-21 | Disposition: A | Discharge status: Home / Self Care | Payer: Medicare Other | Source: Ambulatory Visit | Attending: Internal Medicine | Admitting: Internal Medicine

## 2020-07-21 ENCOUNTER — Other Ambulatory Visit: Payer: Self-pay

## 2020-07-21 DIAGNOSIS — Z1231 Encounter for screening mammogram for malignant neoplasm of breast: Secondary | ICD-10-CM | POA: Insufficient documentation

## 2021-01-12 ENCOUNTER — Other Ambulatory Visit: Payer: Self-pay | Admitting: Internal Medicine

## 2021-01-12 DIAGNOSIS — I714 Abdominal aortic aneurysm, without rupture, unspecified: Secondary | ICD-10-CM

## 2021-01-19 ENCOUNTER — Ambulatory Visit
Admission: RE | Admit: 2021-01-19 | Discharge: 2021-01-19 | Disposition: A | Discharge status: Home / Self Care | Payer: Medicare Other | Source: Ambulatory Visit | Attending: Internal Medicine | Admitting: Internal Medicine

## 2021-01-19 DIAGNOSIS — I714 Abdominal aortic aneurysm, without rupture, unspecified: Secondary | ICD-10-CM | POA: Diagnosis present

## 2021-01-19 DIAGNOSIS — I723 Aneurysm of iliac artery: Secondary | ICD-10-CM

## 2021-01-19 HISTORY — DX: Aneurysm of iliac artery: I72.3

## 2021-01-27 ENCOUNTER — Encounter (INDEPENDENT_AMBULATORY_CARE_PROVIDER_SITE_OTHER): Payer: Self-pay | Admitting: Vascular Surgery

## 2021-01-27 ENCOUNTER — Ambulatory Visit (INDEPENDENT_AMBULATORY_CARE_PROVIDER_SITE_OTHER): Payer: Medicare Other | Admitting: Vascular Surgery

## 2021-01-27 ENCOUNTER — Other Ambulatory Visit: Payer: Self-pay

## 2021-01-27 VITALS — BP 178/77 | HR 76 | Resp 17 | Ht 66.0 in | Wt 188.0 lb

## 2021-01-27 DIAGNOSIS — E1129 Type 2 diabetes mellitus with other diabetic kidney complication: Secondary | ICD-10-CM | POA: Diagnosis not present

## 2021-01-27 DIAGNOSIS — I723 Aneurysm of iliac artery: Secondary | ICD-10-CM

## 2021-01-27 DIAGNOSIS — E782 Mixed hyperlipidemia: Secondary | ICD-10-CM | POA: Diagnosis not present

## 2021-01-27 DIAGNOSIS — I713 Abdominal aortic aneurysm, ruptured, unspecified: Secondary | ICD-10-CM

## 2021-01-27 DIAGNOSIS — I10 Essential (primary) hypertension: Secondary | ICD-10-CM

## 2021-01-27 DIAGNOSIS — R809 Proteinuria, unspecified: Secondary | ICD-10-CM

## 2021-01-27 NOTE — Assessment & Plan Note (Signed)
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  Not symptomatic, but approaching threshold for prophylactic repair which is generally 3 cm.  At this point, short interval follow-up of about 3 months will be planned with a CT angiogram.  At that time, if she has any growth, we will go ahead and consider repair.

## 2021-01-27 NOTE — Progress Notes (Signed)
Patient ID: Dawn Ford, female   DOB: [birth year], 76 y.o.   MRN: [id number]  Chief Complaint  Patient presents with   New Patient (Initial Visit)    Consult for aneurysm    HPI TERYL Ford is a 76 y.o. female.  I am asked to see the patient by Dr. 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 The patient has no symptoms of aneurysms. Specifically, the patient denies new back or abdominal pain, or signs of peripheral embolization.  She had a brother who died from an aneurysm so she is well aware of aneurysms.  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     Past Medical History:  Diagnosis Date   Bilateral carotid artery stenosis    Coronary artery disease    Diabetes mellitus without complication ([place])    Hyperlipemia    Hypertension     Past Surgical History:  Procedure Laterality Date   COLONOSCOPY WITH PROPOFOL N/A 09/01/2018   Procedure: COLONOSCOPY WITH PROPOFOL;  Surgeon: Toledo, Benay Pike, MD;  Location: [facility] END[facility]COPY;  Service: Gastroenterology;  Laterality: N/A;   CORONARY ANGIOPLASTY WITH STENT PLACEMENT     ESOPHAGOGASTRODUODENOSCOPY (EGD) WITH PROPOFOL N/A 03/13/2017   Procedure: ESOPHAGOGASTRODUODENOSCOPY (EGD) WITH PROPOFOL;  Surgeon: Virgel Manifold, MD;  Location: [facility] ENDOSCOPY;  Service: Endoscopy;  Laterality: N/A;   KNEE ARTHROSCOPY       Family History  Problem Relation Age of Onset   Breast cancer Other   No bleeding or clotting disorders No aneurysms   Social History   Tobacco Use   Smoking status: Former    Types: Cigarettes    Quit date: 2000    Years since quitting: 23.0   Smokeless tobacco: Never  Vaping Use   Vaping Use: Never used  Substance Use Topics   Alcohol use: Yes    Comment: rarely   Drug use: No    No Known Allergies  Current Outpatient Medications  Medication Sig Dispense Refill   aspirin EC 81 MG  tablet Take 81 mg by mouth daily.     dicyclomine (BENTYL) 20 MG tablet Take 1 tablet (20 mg total) by mouth 3 (three) times daily as needed for spasms. 30 tablet 0   EQ FAMOTIDINE MAX ST 20 MG tablet Take 1 tablet by mouth twice daily 90 tablet 0   fexofenadine (ALLEGRA) 180 MG tablet Take by mouth.     glimepiride (AMARYL) 4 MG tablet Take by mouth.     hydrochlorothiazide (HYDRODIURIL) 25 MG tablet Take 25 mg by mouth daily.     meloxicam (MOBIC) 7.5 MG tablet Take 7.5 mg by mouth daily.     metoprolol tartrate (LOPRESSOR) 50 MG tablet      nitroGLYCERIN (NITROSTAT) 0.4 MG SL tablet Place under the tongue.     ONETOUCH VERIO test strip      pioglitazone (ACTOS) 15 MG tablet Take by mouth.     ramipril (ALTACE) 10 MG capsule      simvastatin (ZOCOR) 40 MG tablet Take 40 mg by mouth daily.     VICTOZA 18 MG/3ML SOPN      ondansetron (ZOFRAN) 4 MG tablet Take 1 tablet (4 mg total) by mouth every 8 (eight) hours as needed for nausea or vomiting. (Patient not taking: Reported on 01/27/2021) 21 tablet 0   No current facility-administered medications for this visit.  REVIEW OF SYSTEMS (Negative unless checked)  Constitutional: [] Weight loss  [] Fever  [] Chills Cardiac: [] Chest pain   [] Chest pressure   [] Palpitations   [] Shortness of breath when laying flat   [] Shortness of breath at rest   [] Shortness of breath with exertion. Vascular:  [] Pain in legs with walking   [] Pain in legs at rest   [] Pain in legs when laying flat   [] Claudication   [] Pain in feet when walking  [] Pain in feet at rest  [] Pain in feet when laying flat   [] History of DVT   [] Phlebitis   [] Swelling in legs   [] Varicose veins   [] Non-healing ulcers Pulmonary:   [] Uses home oxygen   [] Productive cough   [] Hemoptysis   [] Wheeze  [] COPD   [] Asthma Neurologic:  [] Dizziness  [] Blackouts   [] Seizures   [] History of stroke   [] History of TIA  [] Aphasia   [] Temporary blindness   [] Dysphagia   [] Weakness or numbness in arms    [] Weakness or numbness in legs Musculoskeletal:  [x] Arthritis   [] Joint swelling   [] Joint pain   [] Low back pain Hematologic:  [] Easy bruising  [] Easy bleeding   [] Hypercoagulable state   [] Anemic  [] Hepatitis Gastrointestinal:  [] Blood in stool   [] Vomiting blood  [] Gastroesophageal reflux/heartburn   [] Abdominal pain Genitourinary:  [] Chronic kidney disease   [] Difficult urination  [] Frequent urination  [] Burning with urination   [] Hematuria Skin:  [] Rashes   [] Ulcers   [] Wounds Psychological:  [] History of anxiety   []  History of major depression.    Physical Exam BP (!) 178/77 (BP Location: Left Arm)    Pulse 76    Resp 17    Ht 5\' 6"  (1.676 m)    Wt 188 lb (85.3 kg)    BMI 30.34 kg/m  Gen:  WD/WN, NAD.  Appears younger than stated age Head: [place]/AT, No temporalis wasting.  Ear/Nose/Throat: Hearing grossly intact, nares w/o erythema or drainage, oropharynx w/o Erythema/Exudate Eyes: Conjunctiva clear, sclera non-icteric  Neck: trachea midline.  No JVD.  Pulmonary:  Good air movement, respirations not labored, no use of accessory muscles  Cardiac: RRR, no JVD Vascular:  Vessel Right Left  Radial Palpable Palpable                                   Gastrointestinal:. No masses, surgical incisions, or scars. Musculoskeletal: M/S 5/5 throughout.  Extremities without ischemic changes.  No deformity or atrophy.  No edema. Neurologic: Sensation grossly intact in extremities.  Symmetrical.  Speech is fluent. Motor exam as listed above. Psychiatric: Judgment intact, Mood & affect appropriate for pt's clinical situation. Dermatologic: No rashes or ulcers noted.  No cellulitis or open wounds.    Radiology US AORTA DUPLEX LIMITED  Result Date: 01/19/2021 CLINICAL DATA:  Questionable AAA on CT. EXAM: ULTRASOUND OF ABDOMINAL AORTA TECHNIQUE: Ultrasound examination of the abdominal aorta and proximal common iliac arteries was performed to evaluate for aneurysm. Additional color and  Doppler images of the distal aorta were obtained to document patency. COMPARISON:  CT in question is not available for review. Chest XR, 12/13/2016. FINDINGS: Abdominal aortic measurements as follows: Proximal:  2.0 x 1.9 cm Mid:  2.1 x 2.1 cm Distal:  2.3 x 2.5 cm Calcified done noncalcified atheromatous atherosclerotic plaque is present in the imaged aortic segments, without Doppler evidence of hemodynamically significant stenosis. Patent: Yes, peak systolic velocity is 948 cm/s Right common iliac artery:  2.8 x 2.6 cm, consistent with aneurysmal dilation. See key image. Left common iliac artery: 1.0 x 1.1 cm IMPRESSION: 1. Non-aneurysmal abdominal aorta, measuring up to 2.5 cm in greatest dimension. 2. 2.8 cm aneurysmal dilation of the RIGHT common iliac artery. Recommend definitive imaging with CTA abdomen pelvis for further evaluation, and referral to a vascular specialist. This recommendation follows ACR consensus guidelines: White Paper of the ACR Incidental Findings Committee II on Vascular Findings. J Am Coll Radiol 2013; 10:789-794. Electronically Signed   By: Michaelle Birks M.D.   On: 01/19/2021 14:57    Labs No results found for this or any previous visit (from the past 2160 hour(s)).  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  Not symptomatic, but approaching threshold for prophylactic repair which is generally 3 cm.  At this point, short interval follow-up of about 3 months will be planned with a CT angiogram.  At that time, if she has any growth, we will go ahead and consider repair.  Benign essential hypertension blood pressure control important in reducing the progression of atherosclerotic disease and aneurysmal growth. On appropriate oral medications.   Type 2 diabetes mellitus with microalbuminuria, without long-term current use of insulin  (HCC) blood glucose control important in reducing the progression of atherosclerotic disease. Also, involved in wound healing. On appropriate medications.   Hyperlipidemia, mixed lipid control important in reducing the progression of atherosclerotic disease. Continue statin therapy      [name] 01/27/2021, 10:28 AM   This note was created with Dragon medical transcription system.  Any errors from dictation are unintentional.

## 2021-01-27 NOTE — Assessment & Plan Note (Signed)
lipid control important in reducing the progression of atherosclerotic disease. Continue statin therapy  

## 2021-01-27 NOTE — Assessment & Plan Note (Signed)
blood glucose control important in reducing the progression of atherosclerotic disease. Also, involved in wound healing. On appropriate medications.  

## 2021-01-27 NOTE — Assessment & Plan Note (Signed)
blood pressure control important in reducing the progression of atherosclerotic disease and aneurysmal growth. On appropriate oral medications.  

## 2021-02-13 ENCOUNTER — Ambulatory Visit
Admission: RE | Admit: 2021-02-13 | Discharge: 2021-02-13 | Disposition: A | Discharge status: Home / Self Care | Payer: Medicare Other | Source: Ambulatory Visit | Attending: Vascular Surgery | Admitting: Vascular Surgery

## 2021-02-13 DIAGNOSIS — I77811 Abdominal aortic ectasia: Secondary | ICD-10-CM

## 2021-02-13 DIAGNOSIS — I713 Abdominal aortic aneurysm, ruptured, unspecified: Secondary | ICD-10-CM | POA: Insufficient documentation

## 2021-02-13 HISTORY — DX: Abdominal aortic ectasia: I77.811

## 2021-02-13 MED ORDER — IOHEXOL 350 MG/ML SOLN
100.0000 mL | Freq: Once | INTRAVENOUS | Status: AC | PRN
  Administered 2021-02-13: 100 mL via INTRAVENOUS

## 2021-02-14 ENCOUNTER — Ambulatory Visit (INDEPENDENT_AMBULATORY_CARE_PROVIDER_SITE_OTHER): Payer: Medicare Other | Admitting: Vascular Surgery

## 2021-02-14 ENCOUNTER — Other Ambulatory Visit: Payer: Self-pay

## 2021-02-14 VITALS — BP 147/71 | HR 73 | Ht 66.0 in | Wt 189.0 lb

## 2021-02-14 DIAGNOSIS — R809 Proteinuria, unspecified: Secondary | ICD-10-CM

## 2021-02-14 DIAGNOSIS — E782 Mixed hyperlipidemia: Secondary | ICD-10-CM | POA: Diagnosis not present

## 2021-02-14 DIAGNOSIS — I723 Aneurysm of iliac artery: Secondary | ICD-10-CM

## 2021-02-14 DIAGNOSIS — E1129 Type 2 diabetes mellitus with other diabetic kidney complication: Secondary | ICD-10-CM | POA: Diagnosis not present

## 2021-02-14 DIAGNOSIS — I10 Essential (primary) hypertension: Secondary | ICD-10-CM

## 2021-02-14 NOTE — Assessment & Plan Note (Signed)
She has undergone a CT angiogram which I have independently reviewed.  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  The actual flow lumen appears quite narrowed in this area.  Her distal aorta is ectatic with her mid and proximal infrarenal aorta being small-ish but normal.  Her femoral arteries appear to have mild disease.  The aneurysm in the right common iliac artery comes down fairly close to the right hypogastric artery takeoff. We had a long discussion today.  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  As such, we can fix her aneurysm with a stent graft and likely extend typical stents them to the external iliac arteries to treat the occlusive disease.  We would also perform coil embolization of the right hypogastric artery most likely to be able to get a good seal of this aneurysm.  I discussed the risk and benefits of the procedure.  I discussed that she would have right hip and buttock claudication for at least several weeks after the procedure.  She voices her understanding and desires to proceed.

## 2021-02-14 NOTE — H&P (View-Only) (Signed)
MRN : [id number]  Dawn Ford is a 76 y.o. (18-Mar-1945) female who presents with chief complaint of  Chief Complaint  Patient presents with   Follow-up    CT results  .  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  She continues to have claudication symptoms in both lower extremities.  These are moderate in nature but seem to be getting worse.  No open wounds, infection, or ulceration.  She has undergone a CT angiogram which I have independently reviewed.  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  The actual flow lumen appears quite narrowed in this area.  Her distal aorta is ectatic with her mid and proximal infrarenal aorta being small-ish but normal.  Her femoral arteries appear to have mild disease.  The aneurysm in the right common iliac artery comes down fairly close to the right hypogastric artery takeoff.  Current Outpatient Medications  Medication Sig Dispense Refill   aspirin EC 81 MG tablet Take 81 mg by mouth daily.     dicyclomine (BENTYL) 20 MG tablet Take 1 tablet (20 mg total) by mouth 3 (three) times daily as needed for spasms. 30 tablet 0   EQ FAMOTIDINE MAX ST 20 MG tablet Take 1 tablet by mouth twice daily 90 tablet 0   fexofenadine (ALLEGRA) 180 MG tablet Take by mouth.     glimepiride (AMARYL) 4 MG tablet Take by mouth.     hydrochlorothiazide (HYDRODIURIL) 25 MG tablet Take 25 mg by mouth daily.     meloxicam (MOBIC) 7.5 MG tablet Take 7.5 mg by mouth daily.     metoprolol tartrate (LOPRESSOR) 50 MG tablet      nitroGLYCERIN (NITROSTAT) 0.4 MG SL tablet Place under the tongue.     ondansetron (ZOFRAN) 4 MG tablet Take 1 tablet (4 mg total) by mouth every 8 (eight) hours as needed for nausea or vomiting. 21  tablet 0   ONETOUCH VERIO test strip      pioglitazone (ACTOS) 15 MG tablet Take by mouth.     ramipril (ALTACE) 10 MG capsule      simvastatin (ZOCOR) 40 MG tablet Take 40 mg by mouth daily.     VICTOZA 18 MG/3ML SOPN      No current facility-administered medications for this visit.    Past Medical History:  Diagnosis Date   Bilateral carotid artery stenosis    Coronary artery disease    Diabetes mellitus without complication ([place])    Hyperlipemia    Hypertension     Past Surgical History:  Procedure Laterality Date   COLONOSCOPY WITH PROPOFOL N/A 09/01/2018   Procedure: COLONOSCOPY WITH PROPOFOL;  Surgeon: Toledo, Benay Pike, MD;  Location: [facility] ENDOSCOPY;  Service: Gastroenterology;  Laterality: N/A;   CORONARY ANGIOPLASTY WITH STENT PLACEMENT     ESOPHAGOGASTRODUODENOSCOPY (EGD) WITH PROPOFOL N/A 03/13/2017   Procedure: ESOPHAGOGASTRODUODENOSCOPY (EGD) WITH PROPOFOL;  Surgeon: Virgel Manifold, MD;  Location: [facility] ENDOSCOPY;  Service: Endoscopy;  Laterality: N/A;   KNEE ARTHROSCOPY       Social History   Tobacco Use   Smoking status: Former    Types: Cigarettes    Quit date: 2000    Years since quitting: 23.1   Smokeless tobacco: Never  Vaping Use  Vaping Use: Never used  Substance Use Topics   Alcohol use: Yes    Comment: rarely   Drug use: No       Family History  Problem Relation Age of Onset   Breast cancer Other   No bleeding or clotting disorders  No Known Allergies   REVIEW OF SYSTEMS (Negative unless checked)  Constitutional: [] Weight loss  [] Fever  [] Chills Cardiac: [] Chest pain   [] Chest pressure   [] Palpitations   [] Shortness of breath when laying flat   [] Shortness of breath at rest   [] Shortness of breath with exertion. Vascular:  [x] Pain in legs with walking   [] Pain in legs at rest   [] Pain in legs when laying flat   [x] Claudication   [] Pain in feet when walking  [] Pain in feet at rest  [] Pain in feet when laying flat   [] History of DVT    [] Phlebitis   [] Swelling in legs   [] Varicose veins   [] Non-healing ulcers Pulmonary:   [] Uses home oxygen   [] Productive cough   [] Hemoptysis   [] Wheeze  [] COPD   [] Asthma Neurologic:  [] Dizziness  [] Blackouts   [] Seizures   [] History of stroke   [] History of TIA  [] Aphasia   [] Temporary blindness   [] Dysphagia   [] Weakness or numbness in arms   [] Weakness or numbness in legs Musculoskeletal:  [x] Arthritis   [] Joint swelling   [] Joint pain   [] Low back pain Hematologic:  [] Easy bruising  [] Easy bleeding   [] Hypercoagulable state   [] Anemic   Gastrointestinal:  [] Blood in stool   [] Vomiting blood  [] Gastroesophageal reflux/heartburn   [] Abdominal pain Genitourinary:  [] Chronic kidney disease   [] Difficult urination  [] Frequent urination  [] Burning with urination   [] Hematuria Skin:  [] Rashes   [] Ulcers   [] Wounds Psychological:  [] History of anxiety   []  History of major depression.  Physical Examination  BP (!) 147/71    Pulse 73    Ht 5\' 6"  (1.676 m)    Wt 189 lb (85.7 kg)    BMI 30.51 kg/m  Gen:  WD/WN, NAD.  Appears younger than stated age Head: [place]/AT, No temporalis wasting. Ear/Nose/Throat: Hearing grossly intact, nares w/o erythema or drainage Eyes: Conjunctiva clear. Sclera non-icteric Neck: Supple.  Trachea midline Pulmonary:  Good air movement, no use of accessory muscles.  Cardiac: RRR, no JVD Vascular:  Vessel Right Left  Radial Palpable Palpable                          PT Palpable Palpable  DP Palpable Palpable   Gastrointestinal: soft, non-tender/non-distended. No guarding/reflex.  Musculoskeletal: M/S 5/5 throughout.  No deformity or atrophy.  No significant lower extremity edema. Neurologic: Sensation grossly intact in extremities.  Symmetrical.  Speech is fluent.  Psychiatric: Judgment intact, Mood & affect appropriate for pt's clinical situation. Dermatologic: No rashes or ulcers noted.  No cellulitis or open wounds.      Labs No results found for this  or any previous visit (from the past 2160 hour(s)).  Radiology US AORTA DUPLEX LIMITED  Result Date: 01/19/2021 CLINICAL DATA:  Questionable AAA on CT. EXAM: ULTRASOUND OF ABDOMINAL AORTA TECHNIQUE: Ultrasound examination of the abdominal aorta and proximal common iliac arteries was performed to evaluate for aneurysm. Additional color and Doppler images of the distal aorta were obtained to document patency. COMPARISON:  CT in question is not available for review. Chest XR, 12/13/2016. FINDINGS: Abdominal aortic measurements as follows: Proximal:  2.0 x  1.9 cm Mid:  2.1 x 2.1 cm Distal:  2.3 x 2.5 cm Calcified done noncalcified atheromatous atherosclerotic plaque is present in the imaged aortic segments, without Doppler evidence of hemodynamically significant stenosis. Patent: Yes, peak systolic velocity is 417 cm/s Right common iliac artery: 2.8 x 2.6 cm, consistent with aneurysmal dilation. See key image. Left common iliac artery: 1.0 x 1.1 cm IMPRESSION: 1. Non-aneurysmal abdominal aorta, measuring up to 2.5 cm in greatest dimension. 2. 2.8 cm aneurysmal dilation of the RIGHT common iliac artery. Recommend definitive imaging with CTA abdomen pelvis for further evaluation, and referral to a vascular specialist. This recommendation follows ACR consensus guidelines: White Paper of the ACR Incidental Findings Committee II on Vascular Findings. J Am Coll Radiol 2013; 10:789-794. Electronically Signed   By: Michaelle Birks M.D.   On: 01/19/2021 14:57   CT Angio Abd/Pel w/ and/or w/o  Result Date: 02/13/2021 CLINICAL DATA:  Preoperative planning for abdominal aortic aneurysm repair. EXAM: CTA ABDOMEN AND PELVIS WITHOUT AND WITH CONTRAST TECHNIQUE: Multidetector CT imaging of the abdomen and pelvis was performed using the standard protocol during bolus administration of intravenous contrast. Multiplanar reconstructed images and MIPs were obtained and reviewed to evaluate the vascular anatomy. RADIATION DOSE  REDUCTION: This exam was performed according to the departmental dose-optimization program which includes automated exposure control, adjustment of the mA and/or kV according to patient size and/or use of iterative reconstruction technique. CONTRAST:  153mL OMNIPAQUE IOHEXOL 350 MG/ML SOLN COMPARISON:  Aortic ultrasound-01/19/2021 FINDINGS: VASCULAR Aorta: There is a large amount of irregular predominantly calcified atherosclerotic plaque throughout the abdominal aorta, not definitively resulting in a hemodynamically significant stenosis. There is mild fusiform ectasia of the distal aspect of the infrarenal abdominal aorta measuring 2.6 cm in maximal diameter (coronal image 44, series 8). While some of the plaque within the abdominal aorta is irregular, there is no definitive evidence of aortic dissection. No perivascular stranding. Celiac: There is a moderate amount of calcified atherosclerotic plaque involving the origin of the celiac artery which approaches 50% luminal narrowing (axial image 43, series 4; sagittal image 61, series 9). Conventional branching pattern. SMA: There is a large amount of mixed calcified and noncalcified atherosclerotic plaque involving the origin and proximal aspect of the SMA resulting in tandem areas of at least 50% luminal narrowing (axial images 50 and 53, series 4; sagittal image 59, series 9). The distal tributaries of the SMA appear widely patent without discrete intraluminal filling defect to suggest distal embolism. Renals: Solitary bilaterally; there is a moderate to large amount of eccentric predominantly calcified atherosclerotic plaque involving the origin and proximal aspects of the bilateral renal arteries approaching at least 50% luminal narrowing bilaterally (coronal images 54 and 58, series 8), however this finding is without associated asymmetric renal atrophy or delayed renal enhancement. No vessel irregularity to suggest a hemodynamically significant narrowing. IMA:  Diseased at its origin though remains patent though with early collateral supply from the SMA. Right lower extremity inflow: There is a large amount of calcified atherosclerotic plaque involving the proximal aspect of the right common femoral iliac artery resulting in a suspected high-grade (greater than 75%) luminal narrowing (axial image 119, series 4; coronal image 45, series 8). Aneurysmal dilatation versus contained penetrating atherosclerotic ulcer (favored) involving the distal aspect of the right common iliac artery measuring 2.9 x 2.7 cm (image 128, series 4) extending for a length of approximately 3.3 cm (coronal image 50, series 8) extending to the level of the vessel's bifurcation. No perivascular  stranding or significant mural thrombus within the aneurysm/ulcer. There is a moderate amount of mixed calcified and noncalcified atherosclerotic plaque involving the proximal aspect of the right external iliac artery approaching 50% luminal narrowing. The mid and distal aspects of the right external iliac artery widely patent without hemodynamically significant narrowing. The right internal iliac artery is diseased though patent and of normal caliber. Left lower extremity inflow: There is a moderate amount of eccentric predominantly calcified atherosclerotic plaque throughout the normal caliber left common iliac artery, not resulting in a hemodynamically significant stenosis. There is a moderate to large amount of eccentric mixed calcified and noncalcified atherosclerotic plaque involving the origin of the left external iliac artery resulting in short segment at least 50% luminal narrowing (axial image 136, series 4; coronal image 53, series 8). The remainder of the mid and distal aspects of the left external iliac artery are of normal caliber and widely patent without a hemodynamically significant narrowing. The left internal iliac artery is disease though patent and of normal caliber. Proximal Outflow:  Minimal amount of mixed calcified and noncalcified atherosclerotic plaque involves the bilateral common femoral and imaged portions of the bilateral superficial femoral arteries, not resulting in a hemodynamically significant stenosis. Veins: The IVC and pelvic venous systems appear widely patent. Review of the MIP images confirms the above findings. _________________________________________________________ NON-VASCULAR Lower chest: Limited visualization of the lower thorax is degraded secondary to focal airspace opacity though demonstrates minimal left basilar subsegmental atelectasis. No discrete focal airspace opacities. No pleural effusion. Cardiomegaly. Calcifications involving the mitral valve annulus. No pericardial effusion. Hepatobiliary: Normal hepatic contour. No discrete hepatic lesions. Normal appearance of the gallbladder given degree distention. No radiopaque gallstones. No intra or extrahepatic biliary ductal dilatation. No ascites. Pancreas: Normal appearance of the pancreas. Spleen: Normal appearance of the spleen. Adrenals/Urinary Tract: There is symmetric enhancement and excretion of the bilateral kidneys. No evidence of nephrolithiasis on this postcontrast examination. Punctate (approximately 0.6 cm) hypoattenuating right-sided renal lesion (image 61, series 4), is too small to adequately characterize though favored to represent a renal cyst. No evidence of nephrolithiasis on this postcontrast examination. There is thickening of the crux of the left adrenal gland without discrete nodule. Normal appearance of the right adrenal gland. Normal appearance of the urinary bladder given degree of distention. Stomach/Bowel: Scattered minimal colonic diverticulosis without evidence superimposed acute diverticulitis. Moderate colonic stool burden without evidence of enteric obstruction. Normal appearance of the terminal ileum and the retrocecal appendix. No discrete areas of bowel wall thickening. Small  hiatal hernia. No pneumoperitoneum, pneumatosis or portal venous gas. Lymphatic: No bulky retroperitoneal, mesenteric, pelvic or inguinal lymphadenopathy. Reproductive: Normal appearance of the pelvic organs for age. No discrete adnexal lesions. No free fluid the pelvic cul-de-sac. Other: Tiny mesenteric fat containing Peri umbilical hernia. Musculoskeletal: No acute or aggressive osseous abnormalities. Moderate DDD of L3-L4 with disc space height loss, endplate irregularity and sclerosis. IMPRESSION: VASCULAR 1. Large amount of calcified atherosclerotic plaque within a mildly ectatic abdominal aorta measuring 2.6 cm in maximal diameter. 2. Suspected hemodynamically significant narrowings involving the origin of the celiac and SMA and likely the IMA. Clinical correlation for symptoms of mesenteric ischemia is advised. 3. Suspected hemodynamically significant narrowings involving both renal arteries, without associated asymmetric renal atrophy or delayed renal enhancement. 4. Aneurysmal dilatation versus contained penetrating atherosclerotic ulcer (favored) involving the mid and distal aspects of the right common iliac artery measuring 2.9 cm in diameter. 5. Suspected hemodynamically significant narrowings involving the right common as well as  the bilateral external iliac arteries. Correlation for symptoms of PA D is advised. 6.  Aortic Atherosclerosis (ICD10-I70.0). NON-VASCULAR 1. Colonic diverticulosis without evidence superimposed acute diverticulitis. Electronically Signed   By: Sandi Mariscal M.D.   On: 02/13/2021 14:52    Assessment/Plan Benign essential hypertension blood pressure control important in reducing the progression of atherosclerotic disease and aneurysmal growth. On appropriate oral medications.     Type 2 diabetes mellitus with microalbuminuria, without long-term current use of insulin (HCC) blood glucose control important in reducing the progression of atherosclerotic disease. Also,  involved in wound healing. On appropriate medications.     Hyperlipidemia, mixed lipid control important in reducing the progression of atherosclerotic disease. Continue statin therapy  Iliac artery aneurysm [facility]) She has undergone a CT angiogram which I have independently reviewed.  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  The actual flow lumen appears quite narrowed in this area.  Her distal aorta is ectatic with her mid and proximal infrarenal aorta being small-ish but normal.  Her femoral arteries appear to have mild disease.  The aneurysm in the right common iliac artery comes down fairly close to the right hypogastric artery takeoff. We had a long discussion today.  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  As such, we can fix her aneurysm with a stent graft and likely extend typical stents them to the external iliac arteries to treat the occlusive disease.  We would also perform coil embolization of the right hypogastric artery most likely to be able to get a good seal of this aneurysm.  I discussed the risk and benefits of the procedure.  I discussed that she would have right hip and buttock claudication for at least several weeks after the procedure.  She voices her understanding and desires to proceed.    [name], MD  02/14/2021 1:55 PM    This note was created with Dragon medical transcription system.  Any errors from dictation are purely unintentional

## 2021-02-14 NOTE — Progress Notes (Signed)
MRN : [id number]  Dawn Ford is a 76 y.o. (1945/02/09) female who presents with chief complaint of  Chief Complaint  Patient presents with   Follow-up    CT results  .  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  She continues to have claudication symptoms in both lower extremities.  These are moderate in nature but seem to be getting worse.  No open wounds, infection, or ulceration.  She has undergone a CT angiogram which I have independently reviewed.  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  The actual flow lumen appears quite narrowed in this area.  Her distal aorta is ectatic with her mid and proximal infrarenal aorta being small-ish but normal.  Her femoral arteries appear to have mild disease.  The aneurysm in the right common iliac artery comes down fairly close to the right hypogastric artery takeoff.  Current Outpatient Medications  Medication Sig Dispense Refill   aspirin EC 81 MG tablet Take 81 mg by mouth daily.     dicyclomine (BENTYL) 20 MG tablet Take 1 tablet (20 mg total) by mouth 3 (three) times daily as needed for spasms. 30 tablet 0   EQ FAMOTIDINE MAX ST 20 MG tablet Take 1 tablet by mouth twice daily 90 tablet 0   fexofenadine (ALLEGRA) 180 MG tablet Take by mouth.     glimepiride (AMARYL) 4 MG tablet Take by mouth.     hydrochlorothiazide (HYDRODIURIL) 25 MG tablet Take 25 mg by mouth daily.     meloxicam (MOBIC) 7.5 MG tablet Take 7.5 mg by mouth daily.     metoprolol tartrate (LOPRESSOR) 50 MG tablet      nitroGLYCERIN (NITROSTAT) 0.4 MG SL tablet Place under the tongue.     ondansetron (ZOFRAN) 4 MG tablet Take 1 tablet (4 mg total) by mouth every 8 (eight) hours as needed for nausea or vomiting. 21  tablet 0   ONETOUCH VERIO test strip      pioglitazone (ACTOS) 15 MG tablet Take by mouth.     ramipril (ALTACE) 10 MG capsule      simvastatin (ZOCOR) 40 MG tablet Take 40 mg by mouth daily.     VICTOZA 18 MG/3ML SOPN      No current facility-administered medications for this visit.    Past Medical History:  Diagnosis Date   Bilateral carotid artery stenosis    Coronary artery disease    Diabetes mellitus without complication ([place])    Hyperlipemia    Hypertension     Past Surgical History:  Procedure Laterality Date   COLONOSCOPY WITH PROPOFOL N/A 09/01/2018   Procedure: COLONOSCOPY WITH PROPOFOL;  Surgeon: Toledo, Benay Pike, MD;  Location: [facility]COPY;  Service: Gastroenterology;  Laterality: N/A;   CORONARY ANGIOPLASTY WITH STENT PLACEMENT     ESOPHAGOGASTRODUODENOSCOPY (EGD) WITH PROPOFOL N/A 03/13/2017   Procedure: ESOPHAGOGASTRODUODENOSCOPY (EGD) WITH PROPOFOL;  Surgeon: Virgel Manifold, MD;  Location: [facility] ENDOSCOPY;  Service: Endoscopy;  Laterality: N/A;   KNEE ARTHROSCOPY       Social History   Tobacco Use   Smoking status: Former    Types: Cigarettes    Quit date: 2000    Years since quitting: 23.1   Smokeless tobacco: Never  Vaping Use  Vaping Use: Never used  Substance Use Topics   Alcohol use: Yes    Comment: rarely   Drug use: No       Family History  Problem Relation Age of Onset   Breast cancer Other   No bleeding or clotting disorders  No Known Allergies   REVIEW OF SYSTEMS (Negative unless checked)  Constitutional: [] Weight loss  [] Fever  [] Chills Cardiac: [] Chest pain   [] Chest pressure   [] Palpitations   [] Shortness of breath when laying flat   [] Shortness of breath at rest   [] Shortness of breath with exertion. Vascular:  [x] Pain in legs with walking   [] Pain in legs at rest   [] Pain in legs when laying flat   [x] Claudication   [] Pain in feet when walking  [] Pain in feet at rest  [] Pain in feet when laying flat   [] History of DVT    [] Phlebitis   [] Swelling in legs   [] Varicose veins   [] Non-healing ulcers Pulmonary:   [] Uses home oxygen   [] Productive cough   [] Hemoptysis   [] Wheeze  [] COPD   [] Asthma Neurologic:  [] Dizziness  [] Blackouts   [] Seizures   [] History of stroke   [] History of TIA  [] Aphasia   [] Temporary blindness   [] Dysphagia   [] Weakness or numbness in arms   [] Weakness or numbness in legs Musculoskeletal:  [x] Arthritis   [] Joint swelling   [] Joint pain   [] Low back pain Hematologic:  [] Easy bruising  [] Easy bleeding   [] Hypercoagulable state   [] Anemic   Gastrointestinal:  [] Blood in stool   [] Vomiting blood  [] Gastroesophageal reflux/heartburn   [] Abdominal pain Genitourinary:  [] Chronic kidney disease   [] Difficult urination  [] Frequent urination  [] Burning with urination   [] Hematuria Skin:  [] Rashes   [] Ulcers   [] Wounds Psychological:  [] History of anxiety   []  History of major depression.  Physical Examination  BP (!) 147/71    Pulse 73    Ht 5\' 6"  (1.676 m)    Wt 189 lb (85.7 kg)    BMI 30.51 kg/m  Gen:  WD/WN, NAD.  Appears younger than stated age Head: [place]/AT, No temporalis wasting. Ear/Nose/Throat: Hearing grossly intact, nares w/o erythema or drainage Eyes: Conjunctiva clear. Sclera non-icteric Neck: Supple.  Trachea midline Pulmonary:  Good air movement, no use of accessory muscles.  Cardiac: RRR, no JVD Vascular:  Vessel Right Left  Radial Palpable Palpable                          PT Palpable Palpable  DP Palpable Palpable   Gastrointestinal: soft, non-tender/non-distended. No guarding/reflex.  Musculoskeletal: M/S 5/5 throughout.  No deformity or atrophy.  No significant lower extremity edema. Neurologic: Sensation grossly intact in extremities.  Symmetrical.  Speech is fluent.  Psychiatric: Judgment intact, Mood & affect appropriate for pt's clinical situation. Dermatologic: No rashes or ulcers noted.  No cellulitis or open wounds.      Labs No results found for this  or any previous visit (from the past 2160 hour(s)).  Radiology US AORTA DUPLEX LIMITED  Result Date: 01/19/2021 CLINICAL DATA:  Questionable AAA on CT. EXAM: ULTRASOUND OF ABDOMINAL AORTA TECHNIQUE: Ultrasound examination of the abdominal aorta and proximal common iliac arteries was performed to evaluate for aneurysm. Additional color and Doppler images of the distal aorta were obtained to document patency. COMPARISON:  CT in question is not available for review. Chest XR, 12/13/2016. FINDINGS: Abdominal aortic measurements as follows: Proximal:  2.0 x  1.9 cm Mid:  2.1 x 2.1 cm Distal:  2.3 x 2.5 cm Calcified done noncalcified atheromatous atherosclerotic plaque is present in the imaged aortic segments, without Doppler evidence of hemodynamically significant stenosis. Patent: Yes, peak systolic velocity is 510 cm/s Right common iliac artery: 2.8 x 2.6 cm, consistent with aneurysmal dilation. See key image. Left common iliac artery: 1.0 x 1.1 cm IMPRESSION: 1. Non-aneurysmal abdominal aorta, measuring up to 2.5 cm in greatest dimension. 2. 2.8 cm aneurysmal dilation of the RIGHT common iliac artery. Recommend definitive imaging with CTA abdomen pelvis for further evaluation, and referral to a vascular specialist. This recommendation follows ACR consensus guidelines: White Paper of the ACR Incidental Findings Committee II on Vascular Findings. J Am Coll Radiol 2013; 10:789-794. Electronically Signed   By: Michaelle Birks M.D.   On: 01/19/2021 14:57   CT Angio Abd/Pel w/ and/or w/o  Result Date: 02/13/2021 CLINICAL DATA:  Preoperative planning for abdominal aortic aneurysm repair. EXAM: CTA ABDOMEN AND PELVIS WITHOUT AND WITH CONTRAST TECHNIQUE: Multidetector CT imaging of the abdomen and pelvis was performed using the standard protocol during bolus administration of intravenous contrast. Multiplanar reconstructed images and MIPs were obtained and reviewed to evaluate the vascular anatomy. RADIATION DOSE  REDUCTION: This exam was performed according to the departmental dose-optimization program which includes automated exposure control, adjustment of the mA and/or kV according to patient size and/or use of iterative reconstruction technique. CONTRAST:  169mL OMNIPAQUE IOHEXOL 350 MG/ML SOLN COMPARISON:  Aortic ultrasound-01/19/2021 FINDINGS: VASCULAR Aorta: There is a large amount of irregular predominantly calcified atherosclerotic plaque throughout the abdominal aorta, not definitively resulting in a hemodynamically significant stenosis. There is mild fusiform ectasia of the distal aspect of the infrarenal abdominal aorta measuring 2.6 cm in maximal diameter (coronal image 44, series 8). While some of the plaque within the abdominal aorta is irregular, there is no definitive evidence of aortic dissection. No perivascular stranding. Celiac: There is a moderate amount of calcified atherosclerotic plaque involving the origin of the celiac artery which approaches 50% luminal narrowing (axial image 43, series 4; sagittal image 61, series 9). Conventional branching pattern. SMA: There is a large amount of mixed calcified and noncalcified atherosclerotic plaque involving the origin and proximal aspect of the SMA resulting in tandem areas of at least 50% luminal narrowing (axial images 50 and 53, series 4; sagittal image 59, series 9). The distal tributaries of the SMA appear widely patent without discrete intraluminal filling defect to suggest distal embolism. Renals: Solitary bilaterally; there is a moderate to large amount of eccentric predominantly calcified atherosclerotic plaque involving the origin and proximal aspects of the bilateral renal arteries approaching at least 50% luminal narrowing bilaterally (coronal images 54 and 58, series 8), however this finding is without associated asymmetric renal atrophy or delayed renal enhancement. No vessel irregularity to suggest a hemodynamically significant narrowing. IMA:  Diseased at its origin though remains patent though with early collateral supply from the SMA. Right lower extremity inflow: There is a large amount of calcified atherosclerotic plaque involving the proximal aspect of the right common femoral iliac artery resulting in a suspected high-grade (greater than 75%) luminal narrowing (axial image 119, series 4; coronal image 45, series 8). Aneurysmal dilatation versus contained penetrating atherosclerotic ulcer (favored) involving the distal aspect of the right common iliac artery measuring 2.9 x 2.7 cm (image 128, series 4) extending for a length of approximately 3.3 cm (coronal image 50, series 8) extending to the level of the vessel's bifurcation. No perivascular  stranding or significant mural thrombus within the aneurysm/ulcer. There is a moderate amount of mixed calcified and noncalcified atherosclerotic plaque involving the proximal aspect of the right external iliac artery approaching 50% luminal narrowing. The mid and distal aspects of the right external iliac artery widely patent without hemodynamically significant narrowing. The right internal iliac artery is diseased though patent and of normal caliber. Left lower extremity inflow: There is a moderate amount of eccentric predominantly calcified atherosclerotic plaque throughout the normal caliber left common iliac artery, not resulting in a hemodynamically significant stenosis. There is a moderate to large amount of eccentric mixed calcified and noncalcified atherosclerotic plaque involving the origin of the left external iliac artery resulting in short segment at least 50% luminal narrowing (axial image 136, series 4; coronal image 53, series 8). The remainder of the mid and distal aspects of the left external iliac artery are of normal caliber and widely patent without a hemodynamically significant narrowing. The left internal iliac artery is disease though patent and of normal caliber. Proximal Outflow:  Minimal amount of mixed calcified and noncalcified atherosclerotic plaque involves the bilateral common femoral and imaged portions of the bilateral superficial femoral arteries, not resulting in a hemodynamically significant stenosis. Veins: The IVC and pelvic venous systems appear widely patent. Review of the MIP images confirms the above findings. _________________________________________________________ NON-VASCULAR Lower chest: Limited visualization of the lower thorax is degraded secondary to focal airspace opacity though demonstrates minimal left basilar subsegmental atelectasis. No discrete focal airspace opacities. No pleural effusion. Cardiomegaly. Calcifications involving the mitral valve annulus. No pericardial effusion. Hepatobiliary: Normal hepatic contour. No discrete hepatic lesions. Normal appearance of the gallbladder given degree distention. No radiopaque gallstones. No intra or extrahepatic biliary ductal dilatation. No ascites. Pancreas: Normal appearance of the pancreas. Spleen: Normal appearance of the spleen. Adrenals/Urinary Tract: There is symmetric enhancement and excretion of the bilateral kidneys. No evidence of nephrolithiasis on this postcontrast examination. Punctate (approximately 0.6 cm) hypoattenuating right-sided renal lesion (image 61, series 4), is too small to adequately characterize though favored to represent a renal cyst. No evidence of nephrolithiasis on this postcontrast examination. There is thickening of the crux of the left adrenal gland without discrete nodule. Normal appearance of the right adrenal gland. Normal appearance of the urinary bladder given degree of distention. Stomach/Bowel: Scattered minimal colonic diverticulosis without evidence superimposed acute diverticulitis. Moderate colonic stool burden without evidence of enteric obstruction. Normal appearance of the terminal ileum and the retrocecal appendix. No discrete areas of bowel wall thickening. Small  hiatal hernia. No pneumoperitoneum, pneumatosis or portal venous gas. Lymphatic: No bulky retroperitoneal, mesenteric, pelvic or inguinal lymphadenopathy. Reproductive: Normal appearance of the pelvic organs for age. No discrete adnexal lesions. No free fluid the pelvic cul-de-sac. Other: Tiny mesenteric fat containing Peri umbilical hernia. Musculoskeletal: No acute or aggressive osseous abnormalities. Moderate DDD of L3-L4 with disc space height loss, endplate irregularity and sclerosis. IMPRESSION: VASCULAR 1. Large amount of calcified atherosclerotic plaque within a mildly ectatic abdominal aorta measuring 2.6 cm in maximal diameter. 2. Suspected hemodynamically significant narrowings involving the origin of the celiac and SMA and likely the IMA. Clinical correlation for symptoms of mesenteric ischemia is advised. 3. Suspected hemodynamically significant narrowings involving both renal arteries, without associated asymmetric renal atrophy or delayed renal enhancement. 4. Aneurysmal dilatation versus contained penetrating atherosclerotic ulcer (favored) involving the mid and distal aspects of the right common iliac artery measuring 2.9 cm in diameter. 5. Suspected hemodynamically significant narrowings involving the right common as well as  the bilateral external iliac arteries. Correlation for symptoms of PA D is advised. 6.  Aortic Atherosclerosis (ICD10-I70.0). NON-VASCULAR 1. Colonic diverticulosis without evidence superimposed acute diverticulitis. Electronically Signed   By: Sandi Mariscal M.D.   On: 02/13/2021 14:52    Assessment/Plan Benign essential hypertension blood pressure control important in reducing the progression of atherosclerotic disease and aneurysmal growth. On appropriate oral medications.     Type 2 diabetes mellitus with microalbuminuria, without long-term current use of insulin (HCC) blood glucose control important in reducing the progression of atherosclerotic disease. Also,  involved in wound healing. On appropriate medications.     Hyperlipidemia, mixed lipid control important in reducing the progression of atherosclerotic disease. Continue statin therapy  Iliac artery aneurysm [facility]) She has undergone a CT angiogram which I have independently reviewed.  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  The actual flow lumen appears quite narrowed in this area.  Her distal aorta is ectatic with her mid and proximal infrarenal aorta being small-ish but normal.  Her femoral arteries appear to have mild disease.  The aneurysm in the right common iliac artery comes down fairly close to the right hypogastric artery takeoff. We had a long discussion today.  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  As such, we can fix her aneurysm with a stent graft and likely extend typical stents them to the external iliac arteries to treat the occlusive disease.  We would also perform coil embolization of the right hypogastric artery most likely to be able to get a good seal of this aneurysm.  I discussed the risk and benefits of the procedure.  I discussed that she would have right hip and buttock claudication for at least several weeks after the procedure.  She voices her understanding and desires to proceed.    [name], MD  02/14/2021 1:55 PM    This note was created with Dragon medical transcription system.  Any errors from dictation are purely unintentional

## 2021-02-27 ENCOUNTER — Telehealth (INDEPENDENT_AMBULATORY_CARE_PROVIDER_SITE_OTHER): Payer: Self-pay

## 2021-02-27 NOTE — Telephone Encounter (Signed)
Spoke with the patient regarding her surgery with Dr. Lucky Cowboy for a AAA stent graft repair on 03/01/21. Patient was given the arrival time of 9:00 am to the [facility]. Pre-op phone call is on 02/28/21 between 8-1 pm and covid testing on 02/28/21 between 8-12 pm. It was explained to the patient to go inside to pre-admission for her covid test. I also advised that she ask about her arrival time again as it can sometimes change. Patient stated she understood and was writing it down.

## 2021-02-28 ENCOUNTER — Other Ambulatory Visit: Payer: Self-pay

## 2021-02-28 ENCOUNTER — Other Ambulatory Visit (INDEPENDENT_AMBULATORY_CARE_PROVIDER_SITE_OTHER): Payer: Self-pay | Admitting: Nurse Practitioner

## 2021-02-28 ENCOUNTER — Other Ambulatory Visit
Admission: RE | Admit: 2021-02-28 | Discharge: 2021-02-28 | Disposition: A | Discharge status: Home / Self Care | Payer: Medicare Other | Source: Ambulatory Visit | Attending: Vascular Surgery | Admitting: Vascular Surgery

## 2021-02-28 ENCOUNTER — Encounter: Payer: Self-pay | Admitting: Vascular Surgery

## 2021-02-28 ENCOUNTER — Encounter
Admission: RE | Admit: 2021-02-28 | Discharge: 2021-02-28 | Disposition: A | Discharge status: Home / Self Care | Payer: Medicare Other | Source: Ambulatory Visit | Attending: Vascular Surgery | Admitting: Vascular Surgery

## 2021-02-28 DIAGNOSIS — I252 Old myocardial infarction: Secondary | ICD-10-CM | POA: Insufficient documentation

## 2021-02-28 DIAGNOSIS — Z20822 Contact with and (suspected) exposure to covid-19: Secondary | ICD-10-CM | POA: Insufficient documentation

## 2021-02-28 DIAGNOSIS — Z955 Presence of coronary angioplasty implant and graft: Secondary | ICD-10-CM | POA: Insufficient documentation

## 2021-02-28 DIAGNOSIS — I723 Aneurysm of iliac artery: Secondary | ICD-10-CM

## 2021-02-28 DIAGNOSIS — E119 Type 2 diabetes mellitus without complications: Secondary | ICD-10-CM | POA: Insufficient documentation

## 2021-02-28 DIAGNOSIS — Z01818 Encounter for other preprocedural examination: Secondary | ICD-10-CM | POA: Insufficient documentation

## 2021-02-28 DIAGNOSIS — I714 Abdominal aortic aneurysm, without rupture, unspecified: Secondary | ICD-10-CM | POA: Insufficient documentation

## 2021-02-28 DIAGNOSIS — I10 Essential (primary) hypertension: Secondary | ICD-10-CM | POA: Insufficient documentation

## 2021-02-28 HISTORY — DX: Gastro-esophageal reflux disease without esophagitis: K21.9

## 2021-02-28 LAB — CBC WITH DIFFERENTIAL/PLATELET
Abs Immature Granulocytes: 0.02 10*3/uL (ref 0.00–0.07)
Basophils Absolute: 0 10*3/uL (ref 0.0–0.1)
Basophils Relative: 1 %
Eosinophils Absolute: 0.1 10*3/uL (ref 0.0–0.5)
Eosinophils Relative: 2 %
HCT: 33.1 % — ABNORMAL LOW (ref 36.0–46.0)
Hemoglobin: 10.6 g/dL — ABNORMAL LOW (ref 12.0–15.0)
Immature Granulocytes: 0 %
Lymphocytes Relative: 22 %
Lymphs Abs: 1.2 10*3/uL (ref 0.7–4.0)
MCH: 29.2 pg (ref 26.0–34.0)
MCHC: 32 g/dL (ref 30.0–36.0)
MCV: 91.2 fL (ref 80.0–100.0)
Monocytes Absolute: 0.5 10*3/uL (ref 0.1–1.0)
Monocytes Relative: 8 %
Neutro Abs: 3.7 10*3/uL (ref 1.7–7.7)
Neutrophils Relative %: 67 %
Platelets: 209 10*3/uL (ref 150–400)
RBC: 3.63 MIL/uL — ABNORMAL LOW (ref 3.87–5.11)
RDW: 13.8 % (ref 11.5–15.5)
WBC: 5.6 10*3/uL (ref 4.0–10.5)
nRBC: 0 % (ref 0.0–0.2)

## 2021-02-28 LAB — BASIC METABOLIC PANEL
Anion gap: 10 (ref 5–15)
BUN: 23 mg/dL (ref 8–23)
CO2: 27 mmol/L (ref 22–32)
Calcium: 9 mg/dL (ref 8.9–10.3)
Chloride: 103 mmol/L (ref 98–111)
Creatinine, Ser: 1.32 mg/dL — ABNORMAL HIGH (ref 0.44–1.00)
GFR, Estimated: 41 mL/min — ABNORMAL LOW (ref >60)
Glucose, Bld: 257 mg/dL — ABNORMAL HIGH (ref 70–99)
Potassium: 3.7 mmol/L (ref 3.5–5.1)
Sodium: 140 mmol/L (ref 135–145)

## 2021-02-28 LAB — TYPE AND SCREEN
ABO/RH(D): O POS
Antibody Screen: NEGATIVE

## 2021-02-28 LAB — SARS CORONAVIRUS 2 (TAT 6-24 HRS): SARS Coronavirus 2: NEGATIVE

## 2021-02-28 NOTE — Progress Notes (Signed)
Perioperative Services  Pre-Admission/Anesthesia Testing Clinical Review  Date: 02/28/21  Patient Demographics:  Name: Dawn Ford DOB:   07-Jul-1945 MRN:   [id number]  Planned Surgical Procedure(s):    Case: [id number] Date/Time: 03/01/21 1000   Procedure: ENDOVASCULAR REPAIR/STENT GRAFT   Anesthesia type: General   Diagnosis: Abdominal aortic aneurysm (AAA) without rupture, unspecified part [I71.40]   Pre-op diagnosis: AAA   GORE   Anesthesia    AAA repair   Location: [facility]   Providers: Algernon Huxley, MD   NOTE: Available PAT nursing documentation and vital signs have been reviewed. Clinical nursing staff has updated patient's PMH/PSHx, current medication list, and drug allergies/intolerances to ensure comprehensive history available to assist in medical decision making as it pertains to the aforementioned surgical procedure and anticipated anesthetic course. Extensive review of available clinical information performed. [place] PMH and PSHx updated with any diagnoses/procedures that  may have been inadvertently omitted during her intake with the pre-admission testing department's nursing staff.  Clinical Discussion:  Dawn Ford is a 76 y.o. female who is submitted for pre-surgical anesthesia review and clearance prior to her undergoing the above procedure. Patient is a Former Smoker (quit 01/1998). Pertinent PMH includes: CAD, NSTEMI, ectatic abdominal aorta, RIGHT common iliac artery aneurysm, aortic atherosclerosis, BILATERAL carotid artery stenosis, angina, HTN, HLD, T2DM, GERD (on daily H2 blocker), lumbar DDD.  Patient is followed by cardiology Clayborn Bigness, MD). She was last seen in the cardiology clinic on 02/06/2021; notes reviewed. At the time of her clinic visit, the patient denied any chest pain, shortness of breath, PND, orthopnea, palpitations, significant peripheral edema, vertiginous symptoms, or presyncope/syncope.  Patient with a PMH significant for  cardiovascular diagnoses.  Of note, complete records regarding cardiovascular history unavailable at time of consult as patient has received care in [place], [place].  Information obtained from patient and from local cardiologist's notes.  Patient suffered an NSTEMI in 02/1999.  She underwent diagnostic left heart catheterization on 02/17/1999 that revealed a 75% stenosis of the LAD and a subtotal occlusion of the RCA with thrombus.  PCI was subsequently performed placing a stent (unknown type/exact location) to the RCA.  TTE performed on 07/01/2019 revealed a normal left ventricular systolic function with mild LVH; LVEF greater than 55%.  There was trivial AR and mild MR/TR.  There was no significant transvalvular gradient to suggest stenosis.  Myocardial perfusion imaging study performed on 07/03/2019 demonstrated a hyperdynamic LVEF of 80%.  Regional wall motion and thickness was normal.  There was no evidence of stress-induced myocardial ischemia or arrhythmia.  Study determined to be normal low risk.  Vascular abdominal ultrasound performed on 01/19/2021 revealing aneurysmal dilatation of the RIGHT common iliac artery measuring up to 2.8 cm.  Subsequent CTA of the abdomen pelvis was performed on 02/13/2021, at which time aneurysmal defect measured 2.9 x 2.7 cm.  Abdominal aorta also noted to be ectatic measuring 2.6 cm.  Blood pressure well controlled on currently prescribed diuretic, beta-blocker, and ACEi therapies.  Patient is on a statin for HLD.  T2DM well-controlled on currently prescribed regimen; last HgbA1c was 6.2% when checked on 12/29/2020. Functional capacity, as defined by [facility], is documented as being >/= 4 METS.  No changes were made to her medication regimen.  Patient follow-up with outpatient cardiology in 6 months or sooner if needed.  Dawn Ford is scheduled for a ENDOVASCULAR REPAIR/STENT GRAFT OF RIGHT COMMON ILIAC ARTERY ANEURYSM on 03/01/2021 with Dr. [name], MD.  Given  patient's past medical history significant for cardiovascular diagnoses, presurgical cardiac clearance was sought by the PAT team. Per cardiology, "this patient is optimized for surgery and may proceed with the planned procedural course with a LOW risk of significant perioperative cardiovascular complications".  In review of her EMR, it is noted that patient is currently on daily antiplatelet therapy.  In reviewing Dr. Bunnie Domino surgical protocol for this procedure, patient was advised to continue her daily low-dose ASA throughout her perioperative course.  Patient denies previous perioperative complications with anesthesia in the past. In review of the available records, it is noted that patient underwent a general anesthetic course here (ASA II) in 08/2018 without documented complications.   Vitals with BMI 02/28/2021 02/14/2021 01/27/2021  Height 5\' 6"  5\' 6"  5\' 6"   Weight 186 lbs 189 lbs 188 lbs  BMI 30.04 16.10 96.04  Systolic - 540 981  Diastolic - 71 77  Pulse - 73 76    Providers/Specialists:   NOTE: Primary physician provider listed below. Patient may have been seen by APP or partner within same practice.   PROVIDER ROLE / SPECIALTY LAST Imelda Pillow, MD Vascular Surgery (Surgeon) 02/14/2021  Idelle Crouch, MD Primary Care Provider 12/29/2020  Katrine Coho, MD Cardiology 02/06/2021   Allergies:  Patient has no known allergies.  Current Home Medications:   No current facility-administered medications for this encounter.    aspirin EC 81 MG tablet   dicyclomine (BENTYL) 20 MG tablet   EQ FAMOTIDINE MAX ST 20 MG tablet   fexofenadine (ALLEGRA) 180 MG tablet   glimepiride (AMARYL) 4 MG tablet   hydrochlorothiazide (HYDRODIURIL) 25 MG tablet   meloxicam (MOBIC) 7.5 MG tablet   metoprolol tartrate (LOPRESSOR) 50 MG tablet   nitroGLYCERIN (NITROSTAT) 0.4 MG SL tablet   ondansetron (ZOFRAN) 4 MG tablet   ONETOUCH VERIO test strip   pioglitazone (ACTOS) 15 MG tablet    ramipril (ALTACE) 10 MG capsule   simvastatin (ZOCOR) 40 MG tablet   VICTOZA 18 MG/3ML SOPN   History:   Past Medical History:  Diagnosis Date   Aneurysm of right common iliac artery ([place]) 01/19/2021   a.) US aorta 01/19/2021: measure 2.8 cm. b.) CTA abdomen/pelvis 02/13/2021: measured 2.9 x 2.7 cm.   Anginal pain ([place])    Aortic atherosclerosis ([facility])    Bilateral carotid artery stenosis    Coronary artery disease    DDD (degenerative disc disease), lumbar    Diverticulosis    Ectatic abdominal aorta ([place]) 02/13/2021   a.) CTA abdomen/pelvis: measured 2.6 cm   GERD (gastroesophageal reflux disease)    Hyperlipemia    Hypertension    NSTEMI (non-ST elevated myocardial infarction) ([place]) 02/17/1999   a.) LHC 02/17/1999 --> 75% LAD and subtotal RCA with thrombus --> PCI performed placing stent (unknown type) to RCA   T2DM (type 2 diabetes mellitus) ([place])    Past Surgical History:  Procedure Laterality Date   CATARACT EXTRACTION, BILATERAL     COLONOSCOPY WITH PROPOFOL N/A 09/01/2018   Procedure: COLONOSCOPY WITH PROPOFOL;  Surgeon: Tol[place], Benay Pike, MD;  Location: [facility];  Service: Gastroenterology;  Laterality: N/A;   CORONARY ANGIOPLASTY WITH STENT PLACEMENT     ESOPHAGOGASTRODUODENOSCOPY (EGD) WITH PROPOFOL N/A 03/13/2017   Procedure: ESOPHAGOGASTRODUODENOSCOPY (EGD) WITH PROPOFOL;  Surgeon: Virgel Manifold, MD;  Location: [facility] ENDOSCOPY;  Service: Endoscopy;  Laterality: N/A;   KNEE ARTHROSCOPY     Family History  Problem Relation Age of Onset   Breast  cancer Other    Social History   Tobacco Use   Smoking status: Former    Types: Cigarettes    Quit date: 2000    Years since quitting: 23.1   Smokeless tobacco: Never  Vaping Use   Vaping Use: Never used  Substance Use Topics   Alcohol use: Yes    Comment: rarely   Drug use: No    Pertinent Clinical Results:  LABS: Labs reviewed: Acceptable for surgery.  Hospital Outpatient Visit on 02/28/2021   Component Date Value Ref Range Status   WBC 02/28/2021 5.6  4.0 - 10.5 K/uL Final   RBC 02/28/2021 3.63 (L)  3.87 - 5.11 MIL/uL Final   Hemoglobin 02/28/2021 10.6 (L)  12.0 - 15.0 g/dL Final   HCT 02/28/2021 33.1 (L)  36.0 - 46.0 % Final   MCV 02/28/2021 91.2  80.0 - 100.0 fL Final   MCH 02/28/2021 29.2  26.0 - 34.0 pg Final   MCHC 02/28/2021 32.0  30.0 - 36.0 g/dL Final   RDW 02/28/2021 13.8  11.5 - 15.5 % Final   Platelets 02/28/2021 209  150 - 400 K/uL Final   nRBC 02/28/2021 0.0  0.0 - 0.2 % Final   Neutrophils Relative % 02/28/2021 67  % Final   Neutro Abs 02/28/2021 3.7  1.7 - 7.7 K/uL Final   Lymphocytes Relative 02/28/2021 22  % Final   Lymphs Abs 02/28/2021 1.2  0.7 - 4.0 K/uL Final   Monocytes Relative 02/28/2021 8  % Final   Monocytes Absolute 02/28/2021 0.5  0.1 - 1.0 K/uL Final   Eosinophils Relative 02/28/2021 2  % Final   Eosinophils Absolute 02/28/2021 0.1  0.0 - 0.5 K/uL Final   Basophils Relative 02/28/2021 1  % Final   Basophils Absolute 02/28/2021 0.0  0.0 - 0.1 K/uL Final   Immature Granulocytes 02/28/2021 0  % Final   Abs Immature Granulocytes 02/28/2021 0.02  0.00 - 0.07 K/uL Final   Performed at [facility], [address] [place], [place] [address]   Sodium 02/28/2021 140  135 - 145 mmol/L Final   Potassium 02/28/2021 3.7  3.5 - 5.1 mmol/L Final   Chloride 02/28/2021 103  98 - 111 mmol/L Final   CO2 02/28/2021 27  22 - 32 mmol/L Final   Glucose, Bld 02/28/2021 257 (H)  70 - 99 mg/dL Final   Glucose reference range applies only to samples taken after fasting for at least 8 hours.   BUN 02/28/2021 23  8 - 23 mg/dL Final   Creatinine, Ser 02/28/2021 1.32 (H)  0.44 - 1.00 mg/dL Final   Calcium 02/28/2021 9.0  8.9 - 10.3 mg/dL Final   GFR, Estimated 02/28/2021 41 (L)  >60 mL/min Final   Comment: (NOTE) Calculated using the CKD-EPI Creatinine Equation (2021)    Anion gap 02/28/2021 10  5 - 15 Final   Performed at [facility], [address] [place], [place] [address]   ABO/RH(D) 02/28/2021 O POS   Final   Antibody Screen 02/28/2021 NEG   Final   Sample Expiration 02/28/2021 03/14/2021,2359   Final   Extend sample reason 02/28/2021    Final                   Value:NO TRANSFUSIONS OR PREGNANCY IN THE PAST 3 MONTHS Performed at [facility], [address] [place], [place] [address]     ECG: Date: 02/28/2021 Time ECG obtained: 1151 AM Rate: 64 bpm Rhythm: normal  sinus Axis (leads I and aVF): Normal Intervals: PR 176 ms. QRS 80 ms. QTc 416 ms. ST segment and T wave changes: No evidence of acute ST segment elevation or depression Comparison: Similar to previous tracing obtained on 12/13/2016   IMAGING / PROCEDURES: CT Angio Abd/Pel w/ and/or w/o performed on 02/13/2021 Large amount of calcified atherosclerotic plaque within a mildly ectatic abdominal aorta measuring 2.6 cm in maximal diameter. Suspected hemodynamically significant narrowings involving the origin of the celiac and SMA and likely the IMA. Clinical correlation for symptoms of mesenteric ischemia is advised. Suspected hemodynamically significant narrowings involving both renal arteries, without associated asymmetric renal atrophy or delayed renal enhancement. Aneurysmal dilatation versus contained penetrating atherosclerotic ulcer (favored) involving the mid and distal aspects of the right common iliac artery measuring 2.9 cm in diameter. Suspected hemodynamically significant narrowings involving the right common as well as the bilateral external iliac arteries. Correlation for symptoms of PAD is advised Aortic atherosclerosis  US AORTA DUPLEX LIMITED performed on 01/19/2021 Non-aneurysmal abdominal aorta, measuring up to 2.5 cm in greatest dimension. 2.8 cm aneurysmal dilation of the RIGHT common iliac artery. Recommend definitive imaging with CTA abdomen pelvis for further evaluation, and referral to a vascular specialist  MYOCARDIAL  PERFUSION IMAGING STUDY (LEXISCAN) performed on 07/03/2019 LVEF 80% Normal myocardial thickening and wall motion No artifact Left ventricular cavity size normal No stress-induced myocardial ischemia or arrhythmia Normal liver study  TRANSTHORACIC ECHOCARDIOGRAM performed on 07/01/2019 LVEF >55% Normal left ventricular systolic function with mild LVH Normal right ventricular systolic function Trivial AR Mild MR and TR No PR No valvular stenosis No pericardial effusion  Impression and Plan:  Dawn Ford has been referred for pre-anesthesia review and clearance prior to her undergoing the planned anesthetic and procedural courses. Available labs, pertinent testing, and imaging results were personally reviewed by me. This patient has been appropriately cleared by cardiology with an overall LOW risk of significant perioperative cardiovascular complications.  Based on clinical review performed today (02/28/21), barring any significant acute changes in the patient's overall condition, it is anticipated that she will be able to proceed with the planned surgical intervention. Any acute changes in clinical condition may necessitate her procedure being postponed and/or cancelled. Patient will meet with anesthesia team (MD and/or CRNA) on the day of her procedure for preoperative evaluation/assessment. Questions regarding anesthetic course will be fielded at that time.   Pre-surgical instructions were reviewed with the patient during her PAT appointment and questions were fielded by PAT clinical staff. Patient was advised that if any questions or concerns arise prior to her procedure then she should return a call to PAT and/or her surgeon's office to discuss.  Dawn Loh, MSN, APRN, FNP-C, CEN [facility]  Peri-operative Services Nurse Practitioner Phone: [phone] Fax: [phone] 02/28/21 4:26 PM  NOTE: This note has been prepared using Dragon dictation software.  Despite my best ability to proofread, there is always the potential that unintentional transcriptional errors may still occur from this process.

## 2021-02-28 NOTE — Patient Instructions (Addendum)
Your procedure is scheduled on: 03/01/21 Report to [place] To find out your arrival time please call [phone] between 1PM - 3PM on TODAY.  Remember: Instructions that are not followed completely may result in serious medical risk, up to and including death, or upon the discretion of your surgeon and anesthesiologist your surgery may need to be rescheduled.     _X__ 1. Do not eat food or drink any liquids after midnight the night before your procedure.                 No gum chewing or hard candies.   __X__2.  On the morning of surgery brush your teeth with toothpaste and water, you                 may rinse your mouth with mouthwash if you wish.  Do not swallow any              toothpaste of mouthwash.     _X__ 3.  No Alcohol for 24 hours before or after surgery.   _X__ 4.  Do Not Smoke or use e-cigarettes For 24 Hours Prior to Your Surgery.                 Do not use any chewable tobacco products for at least 6 hours prior to                 surgery.  ____  5.  Bring all medications with you on the day of surgery if instructed.   __X__  6.  Notify your doctor if there is any change in your medical condition      (cold, fever, infections).     Do not wear jewelry, make-up, hairpins, clips or nail polish. Do not wear lotions, powders, or perfumes.  Do not shave body hair 48 hours prior to surgery. Men may shave face and neck. Do not bring valuables to the hospital.    [facility] is not responsible for any belongings or valuables.  Contacts, dentures/partials or body piercings may not be worn into surgery. Bring a case for your contacts, glasses or hearing aids, a denture cup will be supplied. Leave your suitcase in the car. After surgery it may be brought to your room. For patients admitted to the hospital, discharge time is determined by your treatment team.   Patients discharged the day of surgery will not be allowed to drive home.   Please  read over the following fact sheets that you were given:     __X__ Take these medicines the morning of surgery with A SIP OF WATER:    1. FAMOTIDINE MAX ST 20 MG tablet  2. metoprolol tartrate (LOPRESSOR) 50 MG tablet  3.   4.  5.  6.  ____ Fleet Enema (as directed)   ____ Use CHG Soap/SAGE wipes as directed  ____ Use inhalers on the day of surgery  ____ Stop metformin/Janumet/Farxiga 2 days prior to surgery    ____ Take 1/2 of usual insulin dose the night before surgery. No insulin the morning          of surgery.   ____ Stop Blood Thinners Coumadin/Plavix/Xarelto/Pleta/Pradaxa/Eliquis/Effient/Aspirin  on   Or contact your Surgeon, Cardiologist or Medical Doctor regarding  ability to stop your blood thinners  __X__ Stop Anti-inflammatories 7 days before surgery such as Advil, Ibuprofen, Motrin,  BC or Goodies Powder, Naprosyn, Naproxen, Aleve   __X__ Stop all herbals and  supplements, fish oil or vitamins  until after surgery.    ____ Bring C-Pap to the hospital.      Do not take Meloxicam today

## 2021-03-01 ENCOUNTER — Inpatient Hospital Stay: Payer: Medicare Other | Admitting: Urgent Care

## 2021-03-01 ENCOUNTER — Encounter: Payer: Self-pay | Admitting: Vascular Surgery

## 2021-03-01 ENCOUNTER — Other Ambulatory Visit: Payer: Self-pay

## 2021-03-01 ENCOUNTER — Encounter
Admission: RE | Disposition: A | Discharge status: Home / Self Care | Payer: Self-pay | Source: Home / Self Care | Attending: Vascular Surgery

## 2021-03-01 ENCOUNTER — Inpatient Hospital Stay
Admission: RE | Admit: 2021-03-01 | Discharge: 2021-03-02 | DRG: 272 | Disposition: A | Discharge status: Home / Self Care | Payer: Medicare Other | Attending: Vascular Surgery | Admitting: Vascular Surgery

## 2021-03-01 ENCOUNTER — Inpatient Hospital Stay: Admit: 2021-03-01 | Payer: Medicare Other | Admitting: Vascular Surgery

## 2021-03-01 DIAGNOSIS — I10 Essential (primary) hypertension: Secondary | ICD-10-CM | POA: Diagnosis present

## 2021-03-01 DIAGNOSIS — I252 Old myocardial infarction: Secondary | ICD-10-CM | POA: Diagnosis not present

## 2021-03-01 DIAGNOSIS — R809 Proteinuria, unspecified: Secondary | ICD-10-CM | POA: Diagnosis present

## 2021-03-01 DIAGNOSIS — E1151 Type 2 diabetes mellitus with diabetic peripheral angiopathy without gangrene: Secondary | ICD-10-CM | POA: Diagnosis present

## 2021-03-01 DIAGNOSIS — E782 Mixed hyperlipidemia: Secondary | ICD-10-CM | POA: Diagnosis present

## 2021-03-01 DIAGNOSIS — I70221 Atherosclerosis of native arteries of extremities with rest pain, right leg: Secondary | ICD-10-CM | POA: Diagnosis present

## 2021-03-01 DIAGNOSIS — Z821 Family history of blindness and visual loss: Secondary | ICD-10-CM | POA: Diagnosis not present

## 2021-03-01 DIAGNOSIS — Z955 Presence of coronary angioplasty implant and graft: Secondary | ICD-10-CM

## 2021-03-01 DIAGNOSIS — Z7982 Long term (current) use of aspirin: Secondary | ICD-10-CM

## 2021-03-01 DIAGNOSIS — Z79899 Other long term (current) drug therapy: Secondary | ICD-10-CM

## 2021-03-01 DIAGNOSIS — I6523 Occlusion and stenosis of bilateral carotid arteries: Secondary | ICD-10-CM | POA: Diagnosis present

## 2021-03-01 DIAGNOSIS — I708 Atherosclerosis of other arteries: Secondary | ICD-10-CM | POA: Diagnosis present

## 2021-03-01 DIAGNOSIS — Z8249 Family history of ischemic heart disease and other diseases of the circulatory system: Secondary | ICD-10-CM

## 2021-03-01 DIAGNOSIS — I77811 Abdominal aortic ectasia: Secondary | ICD-10-CM | POA: Diagnosis present

## 2021-03-01 DIAGNOSIS — I700 Atherosclerosis of aorta: Secondary | ICD-10-CM | POA: Diagnosis present

## 2021-03-01 DIAGNOSIS — K219 Gastro-esophageal reflux disease without esophagitis: Secondary | ICD-10-CM | POA: Diagnosis present

## 2021-03-01 DIAGNOSIS — Z803 Family history of malignant neoplasm of breast: Secondary | ICD-10-CM | POA: Diagnosis not present

## 2021-03-01 DIAGNOSIS — Z825 Family history of asthma and other chronic lower respiratory diseases: Secondary | ICD-10-CM | POA: Diagnosis not present

## 2021-03-01 DIAGNOSIS — Z87891 Personal history of nicotine dependence: Secondary | ICD-10-CM | POA: Diagnosis not present

## 2021-03-01 DIAGNOSIS — Z7984 Long term (current) use of oral hypoglycemic drugs: Secondary | ICD-10-CM

## 2021-03-01 DIAGNOSIS — I714 Abdominal aortic aneurysm, without rupture, unspecified: Secondary | ICD-10-CM

## 2021-03-01 DIAGNOSIS — Z818 Family history of other mental and behavioral disorders: Secondary | ICD-10-CM

## 2021-03-01 DIAGNOSIS — Z20822 Contact with and (suspected) exposure to covid-19: Secondary | ICD-10-CM | POA: Diagnosis present

## 2021-03-01 DIAGNOSIS — Z791 Long term (current) use of non-steroidal anti-inflammatories (NSAID): Secondary | ICD-10-CM

## 2021-03-01 DIAGNOSIS — I2510 Atherosclerotic heart disease of native coronary artery without angina pectoris: Secondary | ICD-10-CM | POA: Diagnosis present

## 2021-03-01 DIAGNOSIS — I723 Aneurysm of iliac artery: Principal | ICD-10-CM | POA: Diagnosis present

## 2021-03-01 HISTORY — DX: Angina pectoris, unspecified: I20.9

## 2021-03-01 HISTORY — DX: Diverticulosis of intestine, part unspecified, without perforation or abscess without bleeding: K57.90

## 2021-03-01 HISTORY — DX: Atherosclerosis of aorta: I70.0

## 2021-03-01 HISTORY — DX: Other intervertebral disc degeneration, lumbar region without mention of lumbar back pain or lower extremity pain: M51.369

## 2021-03-01 HISTORY — PX: ENDOVASCULAR REPAIR/STENT GRAFT: CATH118280

## 2021-03-01 HISTORY — DX: Other intervertebral disc degeneration, lumbar region: M51.36

## 2021-03-01 HISTORY — DX: Type 2 diabetes mellitus without complications: E11.9

## 2021-03-01 LAB — GLUCOSE, CAPILLARY
Glucose-Capillary: 114 mg/dL — ABNORMAL HIGH (ref 70–99)
Glucose-Capillary: 116 mg/dL — ABNORMAL HIGH (ref 70–99)
Glucose-Capillary: 177 mg/dL — ABNORMAL HIGH (ref 70–99)
Glucose-Capillary: 249 mg/dL — ABNORMAL HIGH (ref 70–99)

## 2021-03-01 LAB — MRSA NEXT GEN BY PCR, NASAL: MRSA by PCR Next Gen: NOT DETECTED

## 2021-03-01 LAB — ABO/RH: ABO/RH(D): O POS

## 2021-03-01 SURGERY — ENDOVASCULAR STENT GRAFT (AAA)
Anesthesia: General

## 2021-03-01 SURGERY — ENDOVASCULAR REPAIR/STENT GRAFT
Anesthesia: Moderate Sedation

## 2021-03-01 MED ORDER — SODIUM CHLORIDE 0.9 % IV SOLN: INTRAVENOUS | Status: DC

## 2021-03-01 MED ORDER — SODIUM CHLORIDE 0.9 % IV SOLN: 500.0000 mL | Freq: Once | INTRAVENOUS | Status: DC | PRN

## 2021-03-01 MED ORDER — ONDANSETRON HCL 4 MG/2ML IJ SOLN
INTRAMUSCULAR | Status: AC
  Filled 2021-03-01: qty 2

## 2021-03-01 MED ORDER — PIOGLITAZONE HCL 15 MG PO TABS
15.0000 mg | ORAL_TABLET | Freq: Every day | ORAL | Status: DC
  Administered 2021-03-01 – 2021-03-02 (×2): 15 mg via ORAL
  Filled 2021-03-01 (×2): qty 1

## 2021-03-01 MED ORDER — HYDRALAZINE HCL 20 MG/ML IJ SOLN
5.0000 mg | INTRAMUSCULAR | Status: AC | PRN
  Administered 2021-03-01: 5 mg via INTRAVENOUS

## 2021-03-01 MED ORDER — ACETAMINOPHEN 325 MG PO TABS: 325.0000 mg | ORAL_TABLET | ORAL | Status: DC | PRN

## 2021-03-01 MED ORDER — RAMIPRIL 10 MG PO CAPS
10.0000 mg | ORAL_CAPSULE | Freq: Every day | ORAL | Status: DC
Start: 2021-03-01 — End: 2021-03-02
  Administered 2021-03-01 – 2021-03-02 (×2): 10 mg via ORAL
  Filled 2021-03-01 (×2): qty 1

## 2021-03-01 MED ORDER — HYDRALAZINE HCL 20 MG/ML IJ SOLN
INTRAMUSCULAR | Status: AC
  Administered 2021-03-01: 5 mg via INTRAVENOUS
  Filled 2021-03-01: qty 1

## 2021-03-01 MED ORDER — LABETALOL HCL 5 MG/ML IV SOLN
INTRAVENOUS | Status: AC
Start: 2021-03-01 — End: 2021-03-01
  Administered 2021-03-01: 10 mg via INTRAVENOUS
  Filled 2021-03-01: qty 4

## 2021-03-01 MED ORDER — CLOPIDOGREL BISULFATE 75 MG PO TABS
75.0000 mg | ORAL_TABLET | Freq: Every day | ORAL | Status: DC
  Administered 2021-03-02: 75 mg via ORAL
  Filled 2021-03-01: qty 1

## 2021-03-01 MED ORDER — FENTANYL CITRATE (PF) 100 MCG/2ML IJ SOLN
INTRAMUSCULAR | Status: AC
  Administered 2021-03-01: 25 ug via INTRAVENOUS
  Filled 2021-03-01: qty 2

## 2021-03-01 MED ORDER — CEFAZOLIN SODIUM-DEXTROSE 2-4 GM/100ML-% IV SOLN
INTRAVENOUS | Status: AC
  Filled 2021-03-01: qty 100

## 2021-03-01 MED ORDER — LABETALOL HCL 5 MG/ML IV SOLN
10.0000 mg | INTRAVENOUS | Status: AC | PRN
  Administered 2021-03-01 (×3): 10 mg via INTRAVENOUS
  Filled 2021-03-01: qty 4

## 2021-03-01 MED ORDER — ORAL CARE MOUTH RINSE: 15.0000 mL | Freq: Once | OROMUCOSAL | Status: DC

## 2021-03-01 MED ORDER — NITROGLYCERIN 0.4 MG SL SUBL: 0.4000 mg | SUBLINGUAL_TABLET | SUBLINGUAL | Status: DC | PRN

## 2021-03-01 MED ORDER — FAMOTIDINE IN NACL 20-0.9 MG/50ML-% IV SOLN
20.0000 mg | Freq: Two times a day (BID) | INTRAVENOUS | Status: DC
  Administered 2021-03-01: 20 mg via INTRAVENOUS
  Filled 2021-03-01: qty 50

## 2021-03-01 MED ORDER — CEFAZOLIN SODIUM-DEXTROSE 2-4 GM/100ML-% IV SOLN
2.0000 g | Freq: Three times a day (TID) | INTRAVENOUS | Status: AC
  Administered 2021-03-01 – 2021-03-02 (×2): 2 g via INTRAVENOUS
  Filled 2021-03-01 (×2): qty 100

## 2021-03-01 MED ORDER — ROCURONIUM BROMIDE 10 MG/ML (PF) SYRINGE
PREFILLED_SYRINGE | INTRAVENOUS | Status: AC
  Filled 2021-03-01: qty 10

## 2021-03-01 MED ORDER — SUGAMMADEX SODIUM 500 MG/5ML IV SOLN
INTRAVENOUS | Status: AC
  Filled 2021-03-01: qty 5

## 2021-03-01 MED ORDER — ALUM & MAG HYDROXIDE-SIMETH 200-200-20 MG/5ML PO SUSP: 15.0000 mL | ORAL | Status: DC | PRN

## 2021-03-01 MED ORDER — NITROGLYCERIN IN D5W 200-5 MCG/ML-% IV SOLN
5.0000 ug/min | INTRAVENOUS | Status: DC
  Administered 2021-03-01: 5 ug/min via INTRAVENOUS
  Filled 2021-03-01: qty 250

## 2021-03-01 MED ORDER — GUAIFENESIN-DM 100-10 MG/5ML PO SYRP: 15.0000 mL | ORAL_SOLUTION | ORAL | Status: DC | PRN

## 2021-03-01 MED ORDER — DOCUSATE SODIUM 100 MG PO CAPS
100.0000 mg | ORAL_CAPSULE | Freq: Every day | ORAL | Status: DC
  Administered 2021-03-02: 100 mg via ORAL
  Filled 2021-03-01: qty 1

## 2021-03-01 MED ORDER — CHLORHEXIDINE GLUCONATE 0.12 % MT SOLN
15.0000 mL | Freq: Once | OROMUCOSAL | Status: DC
  Filled 2021-03-01: qty 15

## 2021-03-01 MED ORDER — CLOPIDOGREL BISULFATE 75 MG PO TABS
75.0000 mg | ORAL_TABLET | Freq: Every day | ORAL | Status: DC
  Filled 2021-03-01: qty 1

## 2021-03-01 MED ORDER — OXYCODONE-ACETAMINOPHEN 5-325 MG PO TABS: 1.0000 | ORAL_TABLET | ORAL | Status: DC | PRN

## 2021-03-01 MED ORDER — MAGNESIUM SULFATE 2 GM/50ML IV SOLN
2.0000 g | Freq: Every day | INTRAVENOUS | Status: DC | PRN
  Filled 2021-03-01: qty 50

## 2021-03-01 MED ORDER — ONDANSETRON HCL 4 MG/2ML IJ SOLN: 4.0000 mg | Freq: Once | INTRAMUSCULAR | Status: DC | PRN

## 2021-03-01 MED ORDER — PROPOFOL 10 MG/ML IV BOLUS
INTRAVENOUS | Status: DC | PRN
  Administered 2021-03-01: 100 mg via INTRAVENOUS

## 2021-03-01 MED ORDER — ROCURONIUM BROMIDE 100 MG/10ML IV SOLN
INTRAVENOUS | Status: DC | PRN
Start: 2021-03-01 — End: 2021-03-01
  Administered 2021-03-01: 10 mg via INTRAVENOUS
  Administered 2021-03-01: 20 mg via INTRAVENOUS
  Administered 2021-03-01: 40 mg via INTRAVENOUS

## 2021-03-01 MED ORDER — FENTANYL CITRATE (PF) 100 MCG/2ML IJ SOLN
INTRAMUSCULAR | Status: DC | PRN
  Administered 2021-03-01 (×2): 50 ug via INTRAVENOUS

## 2021-03-01 MED ORDER — SUGAMMADEX SODIUM 200 MG/2ML IV SOLN
INTRAVENOUS | Status: DC | PRN
Start: 2021-03-01 — End: 2021-03-01
  Administered 2021-03-01: 200 mg via INTRAVENOUS

## 2021-03-01 MED ORDER — CHLORHEXIDINE GLUCONATE CLOTH 2 % EX PADS
6.0000 | MEDICATED_PAD | Freq: Once | CUTANEOUS | Status: AC
  Administered 2021-03-01: 6 via TOPICAL

## 2021-03-01 MED ORDER — METOPROLOL TARTRATE 50 MG PO TABS
50.0000 mg | ORAL_TABLET | Freq: Two times a day (BID) | ORAL | Status: DC
  Administered 2021-03-01 – 2021-03-02 (×2): 50 mg via ORAL
  Filled 2021-03-01 (×2): qty 1

## 2021-03-01 MED ORDER — CEFAZOLIN SODIUM-DEXTROSE 2-4 GM/100ML-% IV SOLN
2.0000 g | INTRAVENOUS | Status: AC
  Administered 2021-03-01: 2 g via INTRAVENOUS

## 2021-03-01 MED ORDER — HEPARIN SODIUM (PORCINE) 1000 UNIT/ML IJ SOLN
INTRAMUSCULAR | Status: DC | PRN
Start: 2021-03-01 — End: 2021-03-01
  Administered 2021-03-01: 5000 [IU] via INTRAVENOUS

## 2021-03-01 MED ORDER — DICYCLOMINE HCL 20 MG PO TABS
20.0000 mg | ORAL_TABLET | Freq: Three times a day (TID) | ORAL | Status: DC | PRN
  Filled 2021-03-01: qty 1

## 2021-03-01 MED ORDER — LABETALOL HCL 5 MG/ML IV SOLN
INTRAVENOUS | Status: DC | PRN
  Administered 2021-03-01: 5 mg via INTRAVENOUS

## 2021-03-01 MED ORDER — HYDROMORPHONE HCL 1 MG/ML IJ SOLN: 1.0000 mg | Freq: Once | INTRAMUSCULAR | Status: DC | PRN

## 2021-03-01 MED ORDER — POTASSIUM CHLORIDE CRYS ER 20 MEQ PO TBCR: 20.0000 meq | EXTENDED_RELEASE_TABLET | Freq: Every day | ORAL | Status: DC | PRN

## 2021-03-01 MED ORDER — PHENOL 1.4 % MT LIQD
1.0000 | OROMUCOSAL | Status: DC | PRN
  Filled 2021-03-01: qty 177

## 2021-03-01 MED ORDER — DOPAMINE-DEXTROSE 3.2-5 MG/ML-% IV SOLN
3.0000 ug/kg/min | INTRAVENOUS | Status: DC
  Filled 2021-03-01: qty 250

## 2021-03-01 MED ORDER — ACETAMINOPHEN 10 MG/ML IV SOLN: 1000.0000 mg | Freq: Once | INTRAVENOUS | Status: AC

## 2021-03-01 MED ORDER — CHLORHEXIDINE GLUCONATE CLOTH 2 % EX PADS: 6.0000 | MEDICATED_PAD | Freq: Once | CUTANEOUS | Status: DC

## 2021-03-01 MED ORDER — LIRAGLUTIDE 18 MG/3ML ~~LOC~~ SOPN: 1.8000 mg | PEN_INJECTOR | Freq: Every evening | SUBCUTANEOUS | Status: DC

## 2021-03-01 MED ORDER — FAMOTIDINE 20 MG PO TABS: 20.0000 mg | ORAL_TABLET | Freq: Two times a day (BID) | ORAL | Status: DC

## 2021-03-01 MED ORDER — ONDANSETRON HCL 4 MG/2ML IJ SOLN
4.0000 mg | Freq: Four times a day (QID) | INTRAMUSCULAR | Status: DC | PRN
  Administered 2021-03-01: 4 mg via INTRAVENOUS

## 2021-03-01 MED ORDER — SIMVASTATIN 20 MG PO TABS
40.0000 mg | ORAL_TABLET | Freq: Every day | ORAL | Status: DC
  Administered 2021-03-01: 40 mg via ORAL
  Filled 2021-03-01: qty 2

## 2021-03-01 MED ORDER — HYDROCHLOROTHIAZIDE 25 MG PO TABS
12.5000 mg | ORAL_TABLET | Freq: Every day | ORAL | Status: DC
Start: 2021-03-01 — End: 2021-03-02
  Administered 2021-03-01 – 2021-03-02 (×2): 12.5 mg via ORAL
  Filled 2021-03-01 (×2): qty 1

## 2021-03-01 MED ORDER — METOPROLOL TARTRATE 5 MG/5ML IV SOLN: 2.0000 mg | INTRAVENOUS | Status: DC | PRN

## 2021-03-01 MED ORDER — ASPIRIN EC 81 MG PO TBEC
81.0000 mg | DELAYED_RELEASE_TABLET | Freq: Every day | ORAL | Status: DC
  Administered 2021-03-02: 81 mg via ORAL
  Filled 2021-03-01: qty 1

## 2021-03-01 MED ORDER — ONDANSETRON HCL 4 MG/2ML IJ SOLN: 4.0000 mg | Freq: Four times a day (QID) | INTRAMUSCULAR | Status: DC | PRN

## 2021-03-01 MED ORDER — FENTANYL CITRATE (PF) 100 MCG/2ML IJ SOLN
25.0000 ug | INTRAMUSCULAR | Status: DC | PRN
  Administered 2021-03-01 (×3): 25 ug via INTRAVENOUS

## 2021-03-01 MED ORDER — DEXAMETHASONE SODIUM PHOSPHATE 10 MG/ML IJ SOLN
INTRAMUSCULAR | Status: DC | PRN
  Administered 2021-03-01: 5 mg via INTRAVENOUS

## 2021-03-01 MED ORDER — DEXAMETHASONE SODIUM PHOSPHATE 10 MG/ML IJ SOLN
INTRAMUSCULAR | Status: AC
  Filled 2021-03-01: qty 1

## 2021-03-01 MED ORDER — LORATADINE 10 MG PO TABS
10.0000 mg | ORAL_TABLET | Freq: Every day | ORAL | Status: DC
  Administered 2021-03-02: 10 mg via ORAL
  Filled 2021-03-01: qty 1

## 2021-03-01 MED ORDER — MORPHINE SULFATE (PF) 2 MG/ML IV SOLN: 2.0000 mg | INTRAVENOUS | Status: DC | PRN

## 2021-03-01 MED ORDER — INSULIN ASPART 100 UNIT/ML IJ SOLN
0.0000 [IU] | INTRAMUSCULAR | Status: DC
  Administered 2021-03-01: 3 [IU] via SUBCUTANEOUS
  Administered 2021-03-01: 5 [IU] via SUBCUTANEOUS
  Filled 2021-03-01 (×2): qty 1

## 2021-03-01 MED ORDER — FENTANYL CITRATE (PF) 100 MCG/2ML IJ SOLN
INTRAMUSCULAR | Status: AC
  Filled 2021-03-01: qty 2

## 2021-03-01 MED ORDER — ACETAMINOPHEN 650 MG RE SUPP: 325.0000 mg | RECTAL | Status: DC | PRN

## 2021-03-01 MED ORDER — GLIMEPIRIDE 2 MG PO TABS
2.0000 mg | ORAL_TABLET | Freq: Two times a day (BID) | ORAL | Status: DC
Start: 2021-03-02 — End: 2021-03-02
  Filled 2021-03-01: qty 1

## 2021-03-01 MED ORDER — ACETAMINOPHEN 10 MG/ML IV SOLN
INTRAVENOUS | Status: AC
Start: 2021-03-01 — End: 2021-03-01
  Administered 2021-03-01: 1000 mg via INTRAVENOUS
  Filled 2021-03-01: qty 100

## 2021-03-01 MED ORDER — ONDANSETRON HCL 4 MG/2ML IJ SOLN
INTRAMUSCULAR | Status: DC | PRN
  Administered 2021-03-01: 4 mg via INTRAVENOUS

## 2021-03-01 MED ORDER — PHENYLEPHRINE HCL-NACL 20-0.9 MG/250ML-% IV SOLN
INTRAVENOUS | Status: AC
  Filled 2021-03-01: qty 250

## 2021-03-01 SURGICAL SUPPLY — 59 items
BALLN ARMADA 35LL 5X250X135 (BALLOONS) ×2
BALLN ATG 12X4X80 (BALLOONS) ×2
BALLN DORADO 6X60X80 (BALLOONS) ×2
BALLOON ARMADA 35LL 5X250X135 (BALLOONS) IMPLANT
BALLOON ATG 12X4X80 (BALLOONS) IMPLANT
BALLOON DORADO 6X60X80 (BALLOONS) IMPLANT
BLADE SURG 15 STRL LF DISP TIS (BLADE) IMPLANT
BLADE SURG 15 STRL SS (BLADE) ×1
BLADE SURG SZ11 CARB STEEL (BLADE) ×1 IMPLANT
BRUSH SCRUB EZ  4% CHG (MISCELLANEOUS) ×1
BRUSH SCRUB EZ 4% CHG (MISCELLANEOUS) IMPLANT
CANNULA 5F STIFF (CANNULA) ×1 IMPLANT
CATH ACCU-VU SIZ PIG 5F 70CM (CATHETERS) ×2 IMPLANT
CATH KUMPE SOFT-VU 5FR 65 (CATHETERS) ×1 IMPLANT
CATH MICROCATH PRGRT 2.8F 110 (CATHETERS) IMPLANT
CATH TEMPO 5F RIM 65CM (CATHETERS) ×1 IMPLANT
CATH VS15FR (CATHETERS) ×1 IMPLANT
CLOSURE PERCLOSE PROSTYLE (VASCULAR PRODUCTS) ×2 IMPLANT
COIL 400 COMPLEX STD 8X40CM (Vascular Products) ×1 IMPLANT
COVER PROBE U/S 5X48 (MISCELLANEOUS) ×1 IMPLANT
DERMABOND ADVANCED (GAUZE/BANDAGES/DRESSINGS) ×2
DERMABOND ADVANCED .7 DNX12 (GAUZE/BANDAGES/DRESSINGS) IMPLANT
DEVICE OCCLUSION PODJ30 (Vascular Products) IMPLANT
DEVICE SAFEGUARD 24CM (GAUZE/BANDAGES/DRESSINGS) ×1 IMPLANT
DEVICE STARCLOSE SE CLOSURE (Vascular Products) ×1 IMPLANT
DEVICE TORQUE (MISCELLANEOUS) ×1 IMPLANT
DEVICE TORQUE .025-.038 (MISCELLANEOUS) ×1 IMPLANT
ELECT CAUTERY BLADE 6.4 (BLADE) ×1 IMPLANT
ELECT REM PT RETURN 9FT ADLT (ELECTROSURGICAL) ×2
ELECTRODE REM PT RTRN 9FT ADLT (ELECTROSURGICAL) IMPLANT
GLIDECATH 4FR STR (CATHETERS) ×1 IMPLANT
GLIDEWIRE ADV .035X180CM (WIRE) ×1 IMPLANT
GLIDEWIRE STIFF .35X180X3 HYDR (WIRE) ×1 IMPLANT
GOWN STRL REUS W/ TWL LRG LVL3 (GOWN DISPOSABLE) IMPLANT
GOWN STRL REUS W/ TWL XL LVL3 (GOWN DISPOSABLE) IMPLANT
GOWN STRL REUS W/TWL LRG LVL3 (GOWN DISPOSABLE) ×1
GOWN STRL REUS W/TWL XL LVL3 (GOWN DISPOSABLE) ×2
HANDLE DETACHMENT COIL (MISCELLANEOUS) ×1 IMPLANT
KIT ENCORE 26 ADVANTAGE (KITS) ×1 IMPLANT
MICROCATH PROGREAT 2.8F 110 CM (CATHETERS) ×2
OCCLUSION DEVICE PODJ30 (Vascular Products) ×2 IMPLANT
PACK ANGIOGRAPHY (CUSTOM PROCEDURE TRAY) ×2 IMPLANT
PACK BASIN MAJOR ARMC (MISCELLANEOUS) ×1 IMPLANT
SHEATH BRITE TIP 5FRX11 (SHEATH) ×1 IMPLANT
SHEATH BRITE TIP 6FRX11 (SHEATH) ×1 IMPLANT
SHEATH BRITE TIP 8FRX11 (SHEATH) ×1 IMPLANT
SPONGE XRAY 4X4 16PLY STRL (MISCELLANEOUS) ×3 IMPLANT
STENT LIFESTREAM 10X58X80 (Permanent Stent) ×1 IMPLANT
STENT LIFESTREAM 9X58X80 (Permanent Stent) ×1 IMPLANT
SUT MNCRL 4-0 (SUTURE) ×2
SUT MNCRL 4-0 27XMFL (SUTURE) ×2
SUT VICRYL+ 3-0 36IN CT-1 (SUTURE) ×1 IMPLANT
SUTURE MNCRL 4-0 27XMF (SUTURE) IMPLANT
SYR 20ML LL LF (SYRINGE) ×1 IMPLANT
SYR MEDRAD MARK 7 150ML (SYRINGE) ×1 IMPLANT
TUBING CONTRAST HIGH PRESS 72 (TUBING) ×1 IMPLANT
WIRE AMPLATZ SSTIFF .035X260CM (WIRE) ×2 IMPLANT
WIRE G 018X200 V18 (WIRE) ×1 IMPLANT
WIRE GUIDERIGHT .035X150 (WIRE) ×2 IMPLANT

## 2021-03-01 NOTE — Anesthesia Preprocedure Evaluation (Signed)
Anesthesia Evaluation  Patient identified by MRN, date of birth, ID band Patient awake    Reviewed: Allergy & Precautions, H&P , NPO status , Patient's Chart, lab work & pertinent test results  History of Anesthesia Complications Negative for: history of anesthetic complications  Airway Mallampati: III  TM Distance: <3 FB Neck ROM: limited    Dental  (+) Chipped, Missing, Dental Advidsory Given, Caps, Partial Upper   Pulmonary neg pulmonary ROS, neg shortness of breath, former smoker,           Cardiovascular Exercise Tolerance: Good hypertension, (-) angina+ CAD, + Cardiac Stents and + Peripheral Vascular Disease  (-) Past MI and (-) DOE (-) dysrhythmias      Neuro/Psych negative neurological ROS  negative psych ROS   GI/Hepatic Neg liver ROS, GERD  ,  Endo/Other  diabetes, Type 2  Renal/GU negative Renal ROS  negative genitourinary   Musculoskeletal   Abdominal   Peds  Hematology negative hematology ROS (+)   Anesthesia Other Findings Past Medical History: No date: Diabetes mellitus without complication (HCC) No date: Hyperlipemia No date: Hypertension  Past Surgical History: No date: CORONARY ANGIOPLASTY WITH STENT PLACEMENT No date: KNEE ARTHROSCOPY  BMI    Body Mass Index:  28.19 kg/m      Reproductive/Obstetrics negative OB ROS                             Anesthesia Physical  Anesthesia Plan  ASA: III  Anesthesia Plan: General   Post-op Pain Management:    Induction: Intravenous  PONV Risk Score and Plan: 3 and Ondansetron, Dexamethasone and Treatment may vary due to age or medical condition  Airway Management Planned: Oral ETT  Additional Equipment:   Intra-op Plan:   Post-operative Plan: Extubation in OR  Informed Consent: I have reviewed the patients History and Physical, chart, labs and discussed the procedure including the risks, benefits and  alternatives for the proposed anesthesia with the patient or authorized representative who has indicated his/her understanding and acceptance.     Dental Advisory Given  Plan Discussed with: Anesthesiologist, CRNA and Surgeon  Anesthesia Plan Comments: (Patient consented for risks of anesthesia including but not limited to:  - adverse reactions to medications - risk of intubation if required - damage to teeth, lips or other oral mucosa - sore throat or hoarseness - Damage to heart, brain, lungs or loss of life  Patient voiced understanding.)        Anesthesia Quick Evaluation

## 2021-03-01 NOTE — Anesthesia Postprocedure Evaluation (Signed)
Anesthesia Post Note  Patient: Dawn Ford  Procedure(s) Performed: ENDOVASCULAR REPAIR/STENT GRAFT  Patient location during evaluation: PACU Anesthesia Type: General Level of consciousness: lethargic Pain management: pain level controlled Vital Signs Assessment: post-procedure vital signs reviewed and stable Respiratory status: spontaneous breathing, nonlabored ventilation, respiratory function stable and patient connected to nasal cannula oxygen Cardiovascular status: blood pressure returned to baseline and stable Postop Assessment: no apparent nausea or vomiting Anesthetic complications: no   There were no known notable events for this encounter.   Last Vitals:  Vitals:   03/01/21 1330 03/01/21 1345  BP:  (!) 176/63  Pulse:  87  Resp:  10  Temp: (!) 36.3 C   SpO2:  99%    Last Pain:  Vitals:   03/01/21 1345  TempSrc:   PainSc: 3                  Martha Clan

## 2021-03-01 NOTE — Progress Notes (Signed)
Right femoral safeguard intact with 63ml, left femoral with star closure, HOB elevated at 1350.  Pulses intact

## 2021-03-01 NOTE — Anesthesia Procedure Notes (Signed)
Procedure Name: Intubation Date/Time: 03/01/2021 10:02 AM Performed by: Jonna Clark, CRNA Pre-anesthesia Checklist: Patient identified, Patient being monitored, Timeout performed, Emergency Drugs available and Suction available Patient Re-evaluated:Patient Re-evaluated prior to induction Oxygen Delivery Method: Circle system utilized Preoxygenation: Pre-oxygenation with 100% oxygen Induction Type: IV induction Ventilation: Mask ventilation without difficulty Laryngoscope Size: 3 and McGraph Grade View: Grade I Tube type: Oral Tube size: 7.0 mm Number of attempts: 1 Airway Equipment and Method: Stylet Placement Confirmation: ETT inserted through vocal cords under direct vision, positive ETCO2 and breath sounds checked- equal and bilateral Secured at: 21 cm Tube secured with: Tape Dental Injury: Teeth and Oropharynx as per pre-operative assessment

## 2021-03-01 NOTE — Progress Notes (Signed)
Patient awake/alert x4. Bilateral groin sites, right femoral with safeguard, per Dr. Lucky Cowboy follow protocol. Left femoral groin area with star closure:  keep legs straight/flat till 1350. Noted left femoral dressing with pin size area of bleeding. Bil pedal pulses intact, warm/dry. Family updated. Medicated per orders for BP per Dr. Lucky Cowboy.

## 2021-03-01 NOTE — Transfer of Care (Signed)
Immediate Anesthesia Transfer of Care Note  Patient: Dawn Ford  Procedure(s) Performed: ENDOVASCULAR REPAIR/STENT GRAFT  Patient Location: PACU  Anesthesia Type:General  Level of Consciousness: drowsy and patient cooperative  Airway & Oxygen Therapy: Patient Spontanous Breathing and Patient connected to face mask oxygen  Post-op Assessment: Report given to RN and Post -op Vital signs reviewed and stable  Post vital signs: Reviewed and stable  Last Vitals:  Vitals Value Taken Time  BP 202/84 03/01/21 1148  Temp    Pulse 81 03/01/21 1149  Resp 14 03/01/21 1149  SpO2 100 % 03/01/21 1149  Vitals shown include unvalidated device data.  Last Pain:  Vitals:   03/01/21 0859  TempSrc: Oral  PainSc: 0-No pain         Complications: There were no known notable events for this encounter.

## 2021-03-01 NOTE — Interval H&P Note (Signed)
History and Physical Interval Note:  03/01/2021 9:10 AM  Dawn Ford  has presented today for surgery, with the diagnosis of AAA   GORE   Anesthesia    AAA repair.  The various methods of treatment have been discussed with the patient and family. After consideration of risks, benefits and other options for treatment, the patient has consented to  Procedure(s): ENDOVASCULAR REPAIR/STENT GRAFT (N/A) as a surgical intervention.  The patient's history has been reviewed, patient examined, no change in status, stable for surgery.  I have reviewed the patient's chart and labs.  Questions were answered to the patient's satisfaction.     [name]

## 2021-03-01 NOTE — Op Note (Signed)
OPERATIVE NOTE   PROCEDURE: Introduction catheter into aorta right common femoral artery approach Introduction catheter into right internal iliac artery third order catheter placement left femoral artery approach Coil embolization of the right internal iliac artery using 2 Odie coils. Exclusion and treatment of right common iliac artery aneurysm using lifestream stent grafts. Ultrasound-guided access to the common femoral arteries bilaterally. 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  This is associated with profound atherosclerotic occlusive disease at multiple locations within the right iliac artery system.  SPECIMEN(S): None  INDICATIONS:   Dawn Ford is a 76 y.o. female who presents with increasing size of an iliac artery aneurysm in association with profound atherosclerotic occlusive disease of the right iliac system causing her rest pain symptoms.  This difficult situation puts her at risk for both limb loss as well as lethal rupture and therefore she is undergoing endovascular repair of her aneurysm and at the same time treatment of her occlusive disease.  The risks and benefits as well as alternative therapies have been reviewed all questions have been answered and the patient wishes to proceed with surgery.  DESCRIPTION: After full informed written consent was obtained from the patient, the patient was brought back to the operating room and placed supine upon  the operating table.  Prior to induction, the patient received IV antibiotics.   After obtaining adequate anesthesia, the patient was then prepped and draped in the standard fashion for a treatment of her combined aneurysmal and occlusive disease.  Co-surgeons are required because this is a bilateral procedure with work being performed simultaneously from both the right femoral and left femoral approach.  This also expedite the procedure making a shorter operative time reducing complications and improving patient safety.  With Dr. Lucky Cowboy working on the patient's right side and myself working on the patient's left we began the case by accessing both common femoral arteries under fluoroscopic guidance.  The right common femoral was noted to be echolucent and pulsatile indicating patency and ventricular recorded for the permanent record with the ultrasound.  Under direct ultrasound visualization Asics to the anterior wall is obtained with a Seldinger needle J-wire was advanced followed by placement of a 5 French sheath.  Perclose devices were used in a preclose fashion and the sheath was upsized to an 8 [place] sheath.  Ultrasound was used to access the anterior wall of the left common femoral.  Femoral artery was echolucent pulsatile indicating patency images recorded for the permanent record.  Under direct ultrasound visualization a microneedle is inserted into the anterior wall followed by microwire micro sheath followed by a J-wire and then a 5 [place] sheath.  Initially we attempted to cross the greater than 95% stenosis at the origin of the right internal iliac artery from the ipsilateral approach.  This was not successful and we elected to cross the aortic bifurcation from the left side using a rim catheter and then advance a Kumpe catheter into the distal right common iliac.  Ultimately I was able to select the internal iliac attempts at  using a balloon to cross and dilate this ostial stenosis were not  successful but the balloon did seat into the lesion and I was able to get a V18 wire through the balloon and into the distal internal iliac I then advanced a prograde catheter after removing the balloon hand-injection contrast demonstrated were in the tertiary branches of the right internal iliac.  I then placed an 8 mm x 40 mm standard coil followed by a 30 cm packing coil.  Follow-up imaging demonstrated obstruction of the artery.  Having successfully coiled this potential source of an endoleak we now proceeded with repair of the aneurysm itself.  0.035 wire was then advanced into the thoracic aorta.  A 9 x 58 life stream stent was then deployed followed by a 10 x 58.  Follow-up imaging demonstrated a small leak around the leading edge of the 10 mm stent and a 12 mm x 40 mm Atlas balloon was used to expand the leading edge of the 10 mm stent.  Inflation was to 10 atm for approximately 1 minute.  Follow-up imaging now demonstrated complete seal and complete exclusion of the iliac aneurysm.  At this point we elected to terminate the case.  The 8 French sheath was removed and the Perclose devices secured.  StarClose device was then used to secure the left.   The patient tolerated this procedure well.   COMPLICATIONS: None  CONDITION: Dawn Ford [facility]  Office: [phone]   03/01/2021, 1:19 PM

## 2021-03-01 NOTE — Op Note (Signed)
[facility]  Percutaneous Study/Intervention Procedural Note   Date of Surgery: 03/01/2021  Surgeon(s):Devine Klingel    Assistants:none  Pre-operative Diagnosis: PAD with claudication extremity, large right common iliac artery  Post-operative diagnosis:  Same  Procedure(s) Performed:             1.  Ultrasound guidance for vascular access bilateral femoral arteries             2.  Catheter placement into right internal iliac artery approach femoral approach             3.  Aortogram and right iliofemoral angiogram             4.  Coil embolization of the right internal iliac artery with 2 Adelina coils             5.  Repair of right common iliac artery aneurysm with extension into the right external iliac artery using 10 mm diameter by 58 mm length lifestream stent and a 9 mm diameter by 58 mm Lifestream stent  6.  Pro-glide closure device right femoral artery             7.  StarClose closure device left femoral artery  EBL: 10 cc  Contrast: 45 cc  Fluoro Time: 18.7 minutes  Anesthesia: general              Indications:  Patient is a 76 y.o.female with a large right common iliac artery aneurysm associated with significant occlusive disease as well in the right iliac system with significant claudication symptoms.  The patient needs repair of the iliac artery aneurysm avoid lethal rupture dressing of the occlusive disease would also be necessary to location the aneurysm, coil embolization of the right hypogastric quired extension to the right external iliac artery will be required. risks and benefits are discussed and informed consent is obtained.  Co-surgeon's are needed due to the complexity of the case as well as working from both sides.  Procedure:  The patient was identified and appropriate procedural time out was performed.  The patient was then placed supine on the table and prepped and draped in the usual sterile fashion. Moderate conscious sedation was  administered during a face to face encounter with the patient throughout the procedure with my supervision of the RN administering medicines and monitoring the patient's vital signs, pulse oximetry, telemetry and mental status throughout from the start of the procedure until the patient was taken to the recovery room. Ultrasound was used to evaluate the right common femoral artery.  It was patent .  A digital ultrasound image was acquired.  A Seldinger needle was used to access the right common femoral artery under direct ultrasound guidance and a permanent image was performed.  6 French sheath was placed and upsized to an 8 [place] sheath 2 ProGlide devices in the post fashion on the right.  The left femoral artery was then accessed.  Direct ultrasound guidance for access difficulty with a Seldinger needle.  A 0.035 J wire was advanced without resistance and a 5Fr sheath was placed.  Pigtail catheter was placed into the aorta and an AP aortogram was performed. This demonstrated normal renal arteries a calcific mildly ectatic infrarenal aorta.  Iliac arteries were highly calcified in the left common and external mildly stenotic.  The right common iliac artery large aneurysm greater than 3 cm that was essentially a pseudoaneurysm with associated occlusive disease above and below.  This disease extended down into  the proximal right external iliac artery as well.  The right hypogastric artery was highly stenotic at its origin with poststenotic dilatation.  Attempted to cannulate the right hypogastric artery and the right femoral approach were unsuccessful.  Dr. Delana Meyer then cannulated the right hypogastric artery femoral approach and was able to advance the prograde microcatheter out into the right hypogastric artery where selective imaging showed the anatomy.  He then deployed an 8 mm diameter by 25 cm length standard Kirsta coil with a 30 cm length packing coils to successfully coil embolized the right hypogastric  artery.  I then advanced the advantage wire out from the right side.  A 9 mm diameter by 58 mm length lifestream stent was deployed from the mid external iliac artery up to the distal common iliac artery right side.  A 10 mm diameter by 58 mm Lifestream stent was then deployed just beyond the origin of the right common iliac artery with about 1 to 2 cm of overlap.  This was postdilated with a 12 mm balloon proximally.completion imaging showed successful exclusion right iliac artery aneurysm with no flow in the aneurysm sac less than 10% residual stenosis after stent placement in the right common and external iliac arteries.  The Pro-glide devices were then secured on the right side and hemostasis was achieved.  The sheath was removed and StarClose closure device was deployed in the left femoral artery with excellent hemostatic result. The patient was taken to the recovery room in stable condition having tolerated the procedure well.  Findings:               Aortogram:  This demonstrated normal renal arteries a calcific mildly ectatic infrarenal aorta.  Iliac arteries were highly calcified in the left common and external mildly stenotic.  The right common iliac artery large aneurysm greater than 3 cm that was essentially a pseudoaneurysm with associated occlusive disease above and below.  This disease extended down into the proximal right external iliac artery as well.  The right hypogastric artery was highly stenotic at its origin with poststenotic dilatation   Disposition: Patient was taken to the recovery room in stable condition having tolerated the procedure well.  Complications: None  [name] 03/01/2021 11:36 AM   This note was created with Dragon Medical transcription system. Any errors in dictation are purely unintentional.

## 2021-03-02 ENCOUNTER — Encounter: Payer: Self-pay | Admitting: Vascular Surgery

## 2021-03-02 LAB — BASIC METABOLIC PANEL
Anion gap: 6 (ref 5–15)
BUN: 18 mg/dL (ref 8–23)
CO2: 23 mmol/L (ref 22–32)
Calcium: 8.1 mg/dL — ABNORMAL LOW (ref 8.9–10.3)
Chloride: 111 mmol/L (ref 98–111)
Creatinine, Ser: 1.05 mg/dL — ABNORMAL HIGH (ref 0.44–1.00)
GFR, Estimated: 54 mL/min — ABNORMAL LOW (ref >60)
Glucose, Bld: 81 mg/dL (ref 70–99)
Potassium: 3.7 mmol/L (ref 3.5–5.1)
Sodium: 140 mmol/L (ref 135–145)

## 2021-03-02 LAB — CBC
HCT: 28.1 % — ABNORMAL LOW (ref 36.0–46.0)
Hemoglobin: 9.2 g/dL — ABNORMAL LOW (ref 12.0–15.0)
MCH: 28.8 pg (ref 26.0–34.0)
MCHC: 32.7 g/dL (ref 30.0–36.0)
MCV: 88.1 fL (ref 80.0–100.0)
Platelets: 175 10*3/uL (ref 150–400)
RBC: 3.19 MIL/uL — ABNORMAL LOW (ref 3.87–5.11)
RDW: 13.8 % (ref 11.5–15.5)
WBC: 9.7 10*3/uL (ref 4.0–10.5)
nRBC: 0 % (ref 0.0–0.2)

## 2021-03-02 LAB — GLUCOSE, CAPILLARY
Glucose-Capillary: 128 mg/dL — ABNORMAL HIGH (ref 70–99)
Glucose-Capillary: 69 mg/dL — ABNORMAL LOW (ref 70–99)
Glucose-Capillary: 103 mg/dL — ABNORMAL HIGH (ref 70–99)

## 2021-03-02 MED ORDER — FAMOTIDINE 20 MG PO TABS
20.0000 mg | ORAL_TABLET | Freq: Two times a day (BID) | ORAL | Status: DC
Start: 2021-03-02 — End: 2021-03-02
  Administered 2021-03-02: 20 mg via ORAL
  Filled 2021-03-02: qty 1

## 2021-03-02 MED ORDER — CLOPIDOGREL BISULFATE 75 MG PO TABS: 75.0000 mg | ORAL_TABLET | Freq: Every day | ORAL | 11 refills | Status: DC

## 2021-03-02 MED ORDER — HYDROCODONE-ACETAMINOPHEN 5-325 MG PO TABS
1.0000 | ORAL_TABLET | Freq: Four times a day (QID) | ORAL | 0 refills | Status: DC | PRN
Start: 2021-03-02 — End: 2021-10-11

## 2021-03-02 NOTE — Progress Notes (Signed)
PHARMACIST - PHYSICIAN COMMUNICATION  CONCERNING: IV to Oral Route Change Policy  RECOMMENDATION: This patient is receiving famotidine by the intravenous route.  Based on criteria approved by the Pharmacy and Therapeutics Committee, the intravenous medication(s) is/are being converted to the equivalent oral dose form(s).   DESCRIPTION: These criteria include: The patient is eating (either orally or via tube) and/or has been taking other orally administered medications for a least 24 hours The patient has no evidence of active gastrointestinal bleeding or impaired GI absorption (gastrectomy, short bowel, patient on TNA or NPO).  If you have questions about this conversion, please contact the [place] 03/02/2021 8:35 AM

## 2021-03-02 NOTE — Progress Notes (Signed)
Discharge from ICU to home. Friend transporting patient home in [place]. Medications have been sent to pharmacy and patient verbalizes they have contacted her they are ready for pick up.   Neuro: alert and oriented x4 Resp: Room air, dry non productive cough  Cardio: WDL, SBP maintaining >130 with Nitro gtt turned off. NP Owens Shark made aware via secure chat. GI/GU: Patient voided 238mL urine in toilet approx 30 minutes after foley was removed.  Skin: Dry, intact Psych: Appropriate for situation. Very pleasant disposition Events: new medication Plavix reviewed with patient by NP Owens Shark. Patient verbalized understanding she will now take in addition to her 81mg  Aspirin.

## 2021-03-02 NOTE — Discharge Summary (Signed)
[facility]    Discharge Summary    Patient ID:  Dawn Ford MRN: [id number] DOB/AGE: Jan 23, 1945 76 y.o.  Admit date: 03/01/2021 Discharge date: 03/02/2021 Date of Surgery: 03/01/2021 Surgeon: Surgeon(s): Algernon Huxley, MD  Admission Diagnosis: Iliac aneurysm [facility]) [I72.3]  Discharge Diagnoses:  Iliac aneurysm [facility]) [I72.3]  Secondary Diagnoses: Past Medical History:  Diagnosis Date   Aneurysm of right common iliac artery ([place]) 01/19/2021   a.) US aorta 01/19/2021: measure 2.8 cm. b.) CTA abdomen/pelvis 02/13/2021: measured 2.9 x 2.7 cm.   Anginal pain (HCC)    Aortic atherosclerosis (HCC)    Bilateral carotid artery stenosis    Coronary artery disease    DDD (degenerative disc disease), lumbar    Diverticulosis    Ectatic abdominal aorta ([place]) 02/13/2021   a.) CTA abdomen/pelvis: measured 2.6 cm   GERD (gastroesophageal reflux disease)    Hyperlipemia    Hypertension    NSTEMI (non-ST elevated myocardial infarction) ([place]) 02/17/1999   a.) LHC 02/17/1999 --> 75% LAD and subtotal RCA with thrombus --> PCI performed placing stent (unknown type) to RCA   T2DM (type 2 diabetes mellitus) ([place])     Procedure(s): ENDOVASCULAR REPAIR/STENT GRAFT  Discharged Condition: good  HPI:  Dawn Ford is a 76 year old female that underwent intervention on 03/01/2021 including:  PROCEDURE: Introduction catheter into aorta right common femoral artery approach Introduction catheter into right internal iliac artery third order catheter placement left femoral artery approach Coil embolization of the right internal iliac artery using 2 Lind coils. Exclusion and treatment of right common iliac artery aneurysm using lifestream stent grafts. Ultrasound-guided access to the common femoral arteries bilaterally. Perclose right common femoral artery StarClose left common femoral artery   She denies any extensive problems overnight.  Her blood pressure was elevated and  there was a nitroglycerin drip done but her blood pressure is well controlled this morning.  No issues with bleeding overnight.  She notes that she feels well currently.  Hospital Course:  Dawn Ford is a 76 y.o. female is S/P endovascular repair of right common iliac artery aneurysm Procedure(s): ENDOVASCULAR REPAIR/STENT GRAFT Extubated: POD # 0 Physical exam: Wounds clean dry and intact, no significant pain.  Feet warm well perfused 2+ pulses bilaterally and DP/PT Post-op wounds clean, dry, intact or healing well Pt. Ambulating, voiding and taking PO diet without difficulty. Pt pain controlled with PO pain meds. Labs as below Complications:none  Consults:    Significant Diagnostic Studies: CBC Lab Results  Component Value Date   WBC 9.7 03/02/2021   HGB 9.2 (L) 03/02/2021   HCT 28.1 (L) 03/02/2021   MCV 88.1 03/02/2021   PLT 175 03/02/2021    BMET    Component Value Date/Time   NA 140 03/02/2021 0429   K 3.7 03/02/2021 0429   K 3.8 10/07/2012 1128   CL 111 03/02/2021 0429   CO2 23 03/02/2021 0429   GLUCOSE 81 03/02/2021 0429   BUN 18 03/02/2021 0429   CREATININE 1.05 (H) 03/02/2021 0429   CALCIUM 8.1 (L) 03/02/2021 0429   GFRNONAA 54 (L) 03/02/2021 0429   GFRAA >60 12/13/2016 0834   COAG No results found for: INR, PROTIME   Disposition:  Discharge to :Home  Allergies as of 03/02/2021   No Known Allergies      Medication List     TAKE these medications    aspirin EC 81 MG tablet Take 81 mg by mouth daily.   clopidogrel 75 MG tablet  Commonly known as: PLAVIX Take 1 tablet (75 mg total) by mouth daily at 6 (six) AM. Start taking on: March 03, 2021   dicyclomine 20 MG tablet Commonly known as: Bentyl Take 1 tablet (20 mg total) by mouth 3 (three) times daily as needed for spasms.   EQ Famotidine Max St 20 MG tablet Generic drug: famotidine Take 1 tablet by mouth twice daily   fexofenadine 180 MG tablet Commonly known as: ALLEGRA Take  180 mg by mouth daily as needed.   glimepiride 4 MG tablet Commonly known as: AMARYL Take 2 mg by mouth 2 (two) times daily.   hydrochlorothiazide 25 MG tablet Commonly known as: HYDRODIURIL Take 12.5 mg by mouth daily.   HYDROcodone-acetaminophen 5-325 MG tablet Commonly known as: NORCO/VICODIN Take 1 tablet by mouth every 6 (six) hours as needed for moderate pain.   meloxicam 7.5 MG tablet Commonly known as: MOBIC Take 7.5 mg by mouth daily as needed.   metoprolol tartrate 50 MG tablet Commonly known as: LOPRESSOR Take 50 mg by mouth 2 (two) times daily.   nitroGLYCERIN 0.4 MG SL tablet Commonly known as: NITROSTAT Place under the tongue.   ondansetron 4 MG tablet Commonly known as: Zofran Take 1 tablet (4 mg total) by mouth every 8 (eight) hours as needed for nausea or vomiting.   OneTouch Verio test strip Generic drug: glucose blood   pioglitazone 15 MG tablet Commonly known as: ACTOS Take 15 mg by mouth daily.   ramipril 10 MG capsule Commonly known as: ALTACE Take 10 mg by mouth daily.   simvastatin 40 MG tablet Commonly known as: ZOCOR Take 40 mg by mouth at bedtime.   Victoza 18 MG/3ML Sopn Generic drug: liraglutide 1.8 mg every evening.       Verbal and written Discharge instructions given to the patient. Wound care per Discharge AVS  Follow-up Information     Kris Hartmann, NP Follow up in 1 month(s).   Specialty: Vascular Surgery Why: with [facility] Contact information: [place] [address] [phone]                 Signed: Kris Hartmann, NP  03/02/2021, 10:22 AM

## 2021-03-02 NOTE — Progress Notes (Signed)
Pt with episode of hypoglycemia, asymptomatic 69. Given 284ml of orange juice. Recheck CBG 128,  30 minutes after PO intake. Will continue to monitor.

## 2021-03-02 NOTE — TOC Initial Note (Signed)
Transition of Care [facility]) - Initial/Assessment Note    Patient Details  Name: Dawn Ford MRN: [id number] Date of Birth: [birth year]  Transition of Care [facility]) CM/SW Contact:    Shelbie Hutching, RN Phone Number: 03/02/2021, 1:31 PM  Clinical Narrative:                  Transition of Care (TOC) Screening Note   Patient Details  Name: Dawn Ford Date of Birth: [birth year]   Transition of Care [facility]) CM/SW Contact:    Shelbie Hutching, RN Phone Number: 03/02/2021, 1:31 PM    Transition of Care Department [facility]) has reviewed patient and no TOC needs have been identified at this time. We will continue to monitor patient advancement through interdisciplinary progression rounds. If new patient transition needs arise, please place a TOC consult.          Patient Goals and CMS Choice        Expected Discharge Plan and Services           Expected Discharge Date: 03/02/21                                    Prior Living Arrangements/Services                       Activities of Daily Living Home Assistive Devices/Equipment: None ADL Screening (condition at time of admission) Patient's cognitive ability adequate to safely complete daily activities?: Yes Is the patient deaf or have difficulty hearing?: No Does the patient have difficulty seeing, even when wearing glasses/contacts?: No Does the patient have difficulty concentrating, remembering, or making decisions?: No Patient able to express need for assistance with ADLs?: Yes Does the patient have difficulty dressing or bathing?: No Independently performs ADLs?: Yes (appropriate for developmental age) Weakness of Legs: None Weakness of Arms/Hands: None  Permission Sought/Granted                  Emotional Assessment              Admission diagnosis:  Iliac aneurysm [facility]) [I72.3] Patient Active Problem List   Diagnosis Date Noted   Iliac aneurysm ([place]) 03/01/2021   Iliac artery aneurysm  ([place]) 01/27/2021   Schatzki's ring    Gastroesophageal reflux disease    Esophageal dysphagia    Stomach irritation    Heartburn    Type 2 diabetes mellitus with microalbuminuria, without long-term current use of insulin ([place]) 02/02/2015   Bilateral carotid artery stenosis 12/23/2014   Benign essential hypertension 12/16/2014   Family hx colonic polyps 09/23/2013   Baker's cyst 10/07/2010   Coronary artery disease involving native coronary artery of native heart 10/07/2010   Hyperlipidemia, mixed 10/07/2010   PCP:  Idelle Crouch, MD Pharmacy:   [facility] [address] [place] [address] Phone: [phone] Fax: [phone]     Social Determinants of Health (SDOH) Interventions    Readmission Risk Interventions No flowsheet data found.

## 2021-03-02 NOTE — Progress Notes (Signed)
Neuro: alert and oriented x 4, moves well in bed Resp: stable on room air CV: afebrile, monitoring closely for sustained hypertension, gtts ordered to control, otherwise vitals fairly stable GIGU: tolerating full liquid diet by end of shift, foley in place, no BM/emesis Skin: clean dry and intact-procedure sites stable, left site with pressure dressing, right site with PAD filled with 77mL-to be removed in AM Social: Multiple family members visiting upon arrival to ICU, all questions and concerns addressed, password and visitor restriction list created  Events: Received patient from PACU following AAA repair- Stent cards placed in envelope with other healthcare paperwork in patient's belonging bag.

## 2021-03-02 NOTE — Progress Notes (Signed)
Inpatient Diabetes Program Recommendations  AACE/ADA: New Consensus Statement on Inpatient Glycemic Control (2015)  Target Ranges:  Prepandial:   less than 140 mg/dL      Peak postprandial:   less than 180 mg/dL (1-2 hours)      Critically ill patients:  140 - 180 mg/dL    Latest Reference Range & Units 03/01/21 09:09 03/01/21 11:49 03/01/21 19:20 03/01/21 23:46 03/02/21 04:45 03/02/21 05:14 03/02/21 07:37  Glucose-Capillary 70 - 99 mg/dL 114 (H) 116 (H) 249 (H)  5 units Novolog  177 (H)  3 units Novolog  69 (L) 128 (H) 103 (H)     Admit for AAA repair  History: DM2  Home DM Meds: Amaryl 2 mg BID       Actos 15 mg daily       Victoza 1.8 mg QPM  Current Orders: Novolog Moderate Correction Scale/ SSI (0-15 units) Q4 hours      Actos 15 mg daily    MD- Note Hypoglycemia at 5am today.  Please consider reduction of the Novolog SSi to the Sensitive scale (0-9 units) TID AC + HS     --Will follow patient during hospitalization--  Wyn Quaker RN, MSN, CDE Diabetes Coordinator Inpatient Glycemic Control Team Team Pager: [phone] (8a-5p)

## 2021-03-03 ENCOUNTER — Encounter: Payer: Self-pay | Admitting: Vascular Surgery

## 2021-03-03 LAB — HEMOGLOBIN A1C
Hgb A1c MFr Bld: 6 % — ABNORMAL HIGH (ref 4.8–5.6)
Mean Plasma Glucose: 126 mg/dL

## 2021-04-04 ENCOUNTER — Other Ambulatory Visit (INDEPENDENT_AMBULATORY_CARE_PROVIDER_SITE_OTHER): Payer: Self-pay | Admitting: Vascular Surgery

## 2021-04-04 DIAGNOSIS — I714 Abdominal aortic aneurysm, without rupture, unspecified: Secondary | ICD-10-CM

## 2021-04-04 DIAGNOSIS — Z8679 Personal history of other diseases of the circulatory system: Secondary | ICD-10-CM

## 2021-04-05 ENCOUNTER — Other Ambulatory Visit: Payer: Self-pay

## 2021-04-05 ENCOUNTER — Other Ambulatory Visit (INDEPENDENT_AMBULATORY_CARE_PROVIDER_SITE_OTHER): Payer: Self-pay | Admitting: Vascular Surgery

## 2021-04-05 ENCOUNTER — Ambulatory Visit (INDEPENDENT_AMBULATORY_CARE_PROVIDER_SITE_OTHER): Payer: Medicare Other

## 2021-04-05 ENCOUNTER — Encounter (INDEPENDENT_AMBULATORY_CARE_PROVIDER_SITE_OTHER): Payer: Self-pay | Admitting: Nurse Practitioner

## 2021-04-05 ENCOUNTER — Ambulatory Visit (INDEPENDENT_AMBULATORY_CARE_PROVIDER_SITE_OTHER): Payer: Medicare Other | Admitting: Nurse Practitioner

## 2021-04-05 VITALS — BP 172/68 | HR 68 | Resp 16 | Wt 183.6 lb

## 2021-04-05 DIAGNOSIS — I723 Aneurysm of iliac artery: Secondary | ICD-10-CM

## 2021-04-05 DIAGNOSIS — Z8679 Personal history of other diseases of the circulatory system: Secondary | ICD-10-CM

## 2021-04-05 DIAGNOSIS — R809 Proteinuria, unspecified: Secondary | ICD-10-CM

## 2021-04-05 DIAGNOSIS — Z9889 Other specified postprocedural states: Secondary | ICD-10-CM | POA: Diagnosis not present

## 2021-04-05 DIAGNOSIS — E1129 Type 2 diabetes mellitus with other diabetic kidney complication: Secondary | ICD-10-CM

## 2021-04-05 DIAGNOSIS — I10 Essential (primary) hypertension: Secondary | ICD-10-CM

## 2021-04-17 ENCOUNTER — Encounter (INDEPENDENT_AMBULATORY_CARE_PROVIDER_SITE_OTHER): Payer: Self-pay | Admitting: Nurse Practitioner

## 2021-04-17 NOTE — Progress Notes (Signed)
? ?Subjective:  ? ? Patient ID: Dawn Ford, female    DOB: [birth year], 76 y.o.   MRN: [id number] ?Chief Complaint  ?Patient presents with  ? Routine Post Op  ?  [facility] 4 week follow up  ? ? ?Dawn Ford is no intervention on  03/01/2021 for: ? ?Procedure(s) Performed: ?            1.  Ultrasound guidance for vascular access bilateral femoral arteries ?            2.  Catheter placement into right internal iliac artery approach femoral approach ?            3.  Aortogram and right iliofemoral angiogram ?            4.  Coil embolization of the right internal iliac artery with 2 Mckynleigh coils ?            5.  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            6.  Pro-glide closure device right femoral artery ?            7.  StarClose closure device left femoral artery ?  ?Today the patient is doing well.  She notes a little soreness at the insertion site but overall is doing well.  No abdominal pain or issues.  Noninvasive studies today showed no evidence of abdominal aortic aneurysm however the common iliac artery, external iliac artery stents postrepair have no evidence of endoleak or stenosis.  Today it measures 2.1 cm x 1.8 cm.  It was previously 3 cm prior to intervention. ? ? ?Review of Systems  ?All other systems reviewed and are negative. ? ?   ?Objective:  ? Physical Exam ?Vitals reviewed.  ?HENT:  ?   Head: Normocephalic.  ?Cardiovascular:  ?   Rate and Rhythm: Normal rate.  ?   Pulses: Normal pulses.  ?Pulmonary:  ?   Effort: Pulmonary effort is normal.  ?Skin: ?   General: Skin is warm and dry.  ?Neurological:  ?   Mental Status: She is alert and oriented to person, place, and time.  ?Psychiatric:     ?   Mood and Affect: Mood normal.     ?   Behavior: Behavior normal.     ?   Thought Content: Thought content normal.     ?   Judgment: Judgment normal.   ? ? ?BP (!) 172/68 (BP Location: Right Arm)   Pulse 68   Resp 16   Wt 183 lb 9.6 oz (83.3 kg)   BMI 29.63 kg/m?  ? ?Past Medical History:  ?Diagnosis Date  ? 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 a.) US aorta 01/19/2021: measure 2.8 cm. b.) CTA abdomen/pelvis 02/13/2021: measured 2.9 x 2.7 cm.  ? Anginal pain ([place])   ? Aortic atherosclerosis ([place])   ? Bilateral carotid artery stenosis   ? Coronary artery disease   ? DDD (degenerative disc disease), lumbar   ? Diverticulosis   ? 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 a.) CTA abdomen/pelvis: measured 2.6 cm  ? GERD (gastroesophageal reflux disease)   ? Hyperlipemia   ? Hypertension   ? NSTEMI (non-ST elevated myocardial infarction) ([place]) 02/17/1999  ? a.) LHC 02/17/1999 --> 75% LAD  and subtotal RCA with thrombus --> PCI performed placing stent (unknown type) to RCA  ? T2DM (type 2 diabetes mellitus) ([place])   ? ? ?Social History  ? ?Socioeconomic History  ? Marital status: Single  ?  Spouse name: Not on file  ? Number of children: Not on file  ? Years of education: Not on file  ? Highest education level: Not on file  ?Occupational History  ? Not on file  ?Tobacco Use  ? Smoking status: Former  ?  Types: Cigarettes  ?  Quit date: 2000  ?  Years since quitting: 23.2  ? Smokeless tobacco: Never  ?Vaping Use  ? Vaping Use: Never used  ?Substance and Sexual Activity  ? Alcohol use: Yes  ?  Comment: rarely  ? Drug use: No  ? Sexual activity: Not on file  ?Other Topics Concern  ? Not on file  ?Social History Narrative  ? Not on file  ? ?Social Determinants of Health  ? ?Financial Resource Strain: Not on file  ?Food Insecurity: Not on file  ?Transportation Needs: Not on file  ?Physical Activity: Not on file  ?Stress: Not on file  ?Social Connections: Not on file  ?Intimate Partner Violence: Not on file  ? ? ?Past Surgical History:  ?Procedure Laterality Date  ? CATARACT EXTRACTION, BILATERAL    ? COLONOSCOPY WITH PROPOFOL N/A 09/01/2018  ?  Procedure: COLONOSCOPY WITH PROPOFOL;  Surgeon: Toledo, Benay Pike, MD;  Location: [facility] ENDOSCOPY;  Service: Gastroenterology;  Laterality: N/A;  ? CORONARY ANGIOPLASTY WITH STENT PLACEMENT    ? ENDOVASCULAR REPAIR/STENT GRAFT N/A 03/01/2021  ? Procedure: ENDOVASCULAR REPAIR/STENT GRAFT;  Surgeon: Algernon Huxley, MD;  Location: [place] [facility];  Service: Cardiovascular;  Laterality: N/A;  ? ESOPHAGOGASTRODUODENOSCOPY (EGD) WITH PROPOFOL N/A 03/13/2017  ? Procedure: ESOPHAGOGASTRODUODENOSCOPY (EGD) WITH PROPOFOL;  Surgeon: Virgel Manifold, MD;  Location: [facility] ENDOSCOPY;  Service: Endoscopy;  Laterality: N/A;  ? KNEE ARTHROSCOPY    ? ? ?Family History  ?Problem Relation Age of Onset  ? Breast cancer Other   ? ? ?No Known Allergies ? ? ?  Latest Ref Rng & Units 03/02/2021  ?  4:29 AM 02/28/2021  ? 11:44 AM 12/13/2016  ?  8:34 AM  ?CBC  ?WBC 4.0 - 10.5 K/uL 9.7   5.6   7.1    ?Hemoglobin 12.0 - 15.0 g/dL 9.2   10.6   11.8    ?Hematocrit 36.0 - 46.0 % 28.1   33.1   35.9    ?Platelets 150 - 400 K/uL 175   209   229    ? ? ? ? ?CMP  ?   ?Component Value Date/Time  ? NA 140 03/02/2021 0429  ? K 3.7 03/02/2021 0429  ? K 3.8 10/07/2012 1128  ? CL 111 03/02/2021 0429  ? CO2 23 03/02/2021 0429  ? GLUCOSE 81 03/02/2021 0429  ? BUN 18 03/02/2021 0429  ? CREATININE 1.05 (H) 03/02/2021 0429  ? CALCIUM 8.1 (L) 03/02/2021 0429  ? PROT 8.1 12/13/2016 0834  ? ALBUMIN 3.8 12/13/2016 0834  ? AST 24 12/13/2016 0834  ? ALT 17 12/13/2016 0834  ? ALKPHOS 74 12/13/2016 0834  ? BILITOT 0.6 12/13/2016 0834  ? GFRNONAA 54 (L) 03/02/2021 0429  ? GFRAA >60 12/13/2016 0834  ? ? ? ?No results found. ? ?   ?Assessment & Plan:  ? ?1. 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 ?No further intervention is required at this time.   ?No endoleak is detected and the aneurysm sac is stable. ? ?The patient will continue antiplatelet therapy as prescribed as well  as aggressive management of hyperlipidemia. Exercise is again strongly encouraged.  ? ?However, endografts require continued surveillance with ultrasound or CT scan. This is mandatory to detect any changes that allow repressurization of the aneurysm sac.  The patient is informed that this would be asymptomatic. ? ?The patient is reminded that lifelong routine surveillance is a necessity with an endograft.  Have the patient return in 3 months ? ?2. Benign essential hypertension ?Continue antihypertensive medications as already ordered, these medications have been reviewed and there are no changes at this time.  ? ?3. Type 2 diabetes mellitus with microalbuminuria, without long-term current use of insulin ([place]) ?Continue hypoglycemic medications as already ordered, these medications have been reviewed and there are no changes at this time. ? ?Hgb A1C to be monitored as already arranged by primary service  ? ? ?Current Outpatient Medications on File Prior to Visit  ?Medication Sig Dispense Refill  ? aspirin EC 81 MG tablet Take 81 mg by mouth daily.    ? clopidogrel (PLAVIX) 75 MG tablet Take 1 tablet (75 mg total) by mouth daily at 6 (six) AM. 30 tablet 11  ? dicyclomine (BENTYL) 20 MG tablet Take 1 tablet (20 mg total) by mouth 3 (three) times daily as needed for spasms. 30 tablet 0  ? EQ FAMOTIDINE MAX ST 20 MG tablet Take 1 tablet by mouth twice daily 90 tablet 0  ? glimepiride (AMARYL) 4 MG tablet Take 2 mg by mouth 2 (two) times daily.    ? hydrochlorothiazide (HYDRODIURIL) 25 MG tablet Take 12.5 mg by mouth daily.    ? HYDROcodone-acetaminophen (NORCO/VICODIN) 5-325 MG tablet Take 1 tablet by mouth every 6 (six) hours as needed for moderate pain. 20 tablet 0  ? meloxicam (MOBIC) 7.5 MG tablet Take 7.5 mg by mouth daily as needed.    ? metoprolol tartrate (LOPRESSOR) 50 MG tablet Take 50 mg by mouth 2 (two) times daily.    ? nitroGLYCERIN (NITROSTAT) 0.4 MG SL tablet Place under the tongue.    ? ONETOUCH VERIO  test strip     ? pioglitazone (ACTOS) 15 MG tablet Take 15 mg by mouth daily.    ? ramipril (ALTACE) 10 MG capsule Take 10 mg by mouth daily.    ? simvastatin (ZOCOR) 40 MG tablet Take 40 mg by mouth at bedtime.    ? V

## 2021-06-13 ENCOUNTER — Other Ambulatory Visit: Payer: Self-pay | Admitting: Internal Medicine

## 2021-06-13 DIAGNOSIS — Z1231 Encounter for screening mammogram for malignant neoplasm of breast: Secondary | ICD-10-CM

## 2021-07-06 ENCOUNTER — Encounter (INDEPENDENT_AMBULATORY_CARE_PROVIDER_SITE_OTHER): Payer: Self-pay | Admitting: Nurse Practitioner

## 2021-07-06 ENCOUNTER — Ambulatory Visit (INDEPENDENT_AMBULATORY_CARE_PROVIDER_SITE_OTHER): Payer: Medicare Other

## 2021-07-06 ENCOUNTER — Ambulatory Visit (INDEPENDENT_AMBULATORY_CARE_PROVIDER_SITE_OTHER): Payer: Medicare Other | Admitting: Nurse Practitioner

## 2021-07-06 ENCOUNTER — Other Ambulatory Visit (INDEPENDENT_AMBULATORY_CARE_PROVIDER_SITE_OTHER): Payer: Self-pay | Admitting: Nurse Practitioner

## 2021-07-06 VITALS — BP 176/72 | HR 64 | Ht 66.0 in | Wt 187.2 lb

## 2021-07-06 DIAGNOSIS — Z8679 Personal history of other diseases of the circulatory system: Secondary | ICD-10-CM | POA: Diagnosis not present

## 2021-07-06 DIAGNOSIS — I10 Essential (primary) hypertension: Secondary | ICD-10-CM | POA: Diagnosis not present

## 2021-07-06 DIAGNOSIS — I723 Aneurysm of iliac artery: Secondary | ICD-10-CM

## 2021-07-06 DIAGNOSIS — E1129 Type 2 diabetes mellitus with other diabetic kidney complication: Secondary | ICD-10-CM | POA: Diagnosis not present

## 2021-07-06 DIAGNOSIS — R809 Proteinuria, unspecified: Secondary | ICD-10-CM

## 2021-07-06 DIAGNOSIS — Z9889 Other specified postprocedural states: Secondary | ICD-10-CM

## 2021-07-22 ENCOUNTER — Encounter (INDEPENDENT_AMBULATORY_CARE_PROVIDER_SITE_OTHER): Payer: Self-pay | Admitting: Nurse Practitioner

## 2021-07-22 NOTE — Progress Notes (Signed)
Subjective:    Patient ID: Dawn Ford, female    DOB: 07/18/1945, 76 y.o.   MRN: [id number] Chief Complaint  Patient presents with   Follow-up    Patient in for a follow up ABI and EVAR.    Dawn Ford is no intervention on  03/01/2021 for:   Procedure(s) Performed:             1.  Ultrasound guidance for vascular access bilateral femoral arteries             2.  Catheter placement into right internal iliac artery approach femoral approach             3.  Aortogram and right iliofemoral angiogram             4.  Coil embolization of the right internal iliac artery with 2 Jemmie coils             5.  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  Pro-glide closure device right femoral artery             7.  StarClose closure device left femoral artery   Today the patient is doing well.  She notes a little soreness at the insertion site but overall is doing well.  No abdominal pain or issues.  Noninvasive studies today showed no evidence of abdominal aortic aneurysm however the common iliac artery, external iliac artery stents postrepair have no evidence of endoleak or stenosis.  Today it measures 2.1 cm x 1.8 cm.  It was previously 3 cm prior to intervention.      Review of Systems  All other systems reviewed and are negative.      Objective:   Physical Exam Vitals reviewed.  HENT:     Head: Normocephalic.  Cardiovascular:     Rate and Rhythm: Normal rate and regular rhythm.     Pulses: Normal pulses.  Pulmonary:     Effort: Pulmonary effort is normal.     Breath sounds: Normal breath sounds.  Skin:    General: Skin is warm and dry.  Neurological:     Mental Status: She is alert and oriented to person, place, and time.  Psychiatric:        Mood and Affect: Mood normal.        Behavior: Behavior normal.         Thought Content: Thought content normal.        Judgment: Judgment normal.     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 b.) CTA abdomen/pelvis 02/13/2021: measured 2.9 x 2.7 cm.   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   a.) CTA abdomen/pelvis: measured 2.6 cm   GERD (gastroesophageal reflux disease)    Hyperlipemia  Hypertension    NSTEMI (non-ST elevated myocardial infarction) ([place]) 02/17/1999   a.) LHC 02/17/1999 --> 75% LAD and subtotal RCA with thrombus --> PCI performed placing stent (unknown type) to RCA   T2DM (type 2 diabetes mellitus) ([place])     Social History   Socioeconomic History   Marital status: Single    Spouse name: Not on file   Number of children: Not on file   Years of education: Not on file   Highest education level: Not on file  Occupational History   Not on file  Tobacco Use   Smoking status: Former    Types: Cigarettes    Quit date: 2000    Years since quitting: 23.5   Smokeless tobacco: Never  Vaping Use   Vaping Use: Never used  Substance and Sexual Activity   Alcohol use: Yes    Comment: rarely   Drug use: No   Sexual activity: Not on file  Other Topics Concern   Not on file  Social History Narrative   Not on file   Social Determinants of Health   Financial Resource Strain: Not on file  Food Insecurity: Not on file  Transportation Needs: Not on file  Physical Activity: Not on file  Stress: Not on file  Social Connections: Not on file  Intimate Partner Violence: Not on file    Past Surgical History:  Procedure Laterality Date   CATARACT  EXTRACTION, BILATERAL     COLONOSCOPY WITH PROPOFOL N/A 09/01/2018   Procedure: COLONOSCOPY WITH PROPOFOL;  Surgeon: Toledo, Benay Pike, MD;  Location: [facility] ENDOSCOPY;  Service: Gastroenterology;  Laterality: N/A;   CORONARY ANGIOPLASTY WITH STENT PLACEMENT     ENDOVASCULAR REPAIR/STENT GRAFT N/A 03/01/2021   Procedure: ENDOVASCULAR REPAIR/STENT GRAFT;  Surgeon: Algernon Huxley, MD;  Location: [place] [facility];  Service: Cardiovascular;  Laterality: N/A;   ESOPHAGOGASTRODUODENOSCOPY (EGD) WITH PROPOFOL N/A 03/13/2017   Procedure: ESOPHAGOGASTRODUODENOSCOPY (EGD) WITH PROPOFOL;  Surgeon: Virgel Manifold, MD;  Location: [facility] ENDOSCOPY;  Service: Endoscopy;  Laterality: N/A;   KNEE ARTHROSCOPY      Family History  Problem Relation Age of Onset   Breast cancer Other     No Known Allergies     Latest Ref Rng & Units 03/02/2021    4:29 AM 02/28/2021   11:44 AM 12/13/2016    8:34 AM  CBC  WBC 4.0 - 10.5 K/uL 9.7  5.6  7.1   Hemoglobin 12.0 - 15.0 g/dL 9.2  10.6  11.8   Hematocrit 36.0 - 46.0 % 28.1  33.1  35.9   Platelets 150 - 400 K/uL 175  209  229       CMP     Component Value Date/Time   NA 140 03/02/2021 0429   K 3.7 03/02/2021 0429   K 3.8 10/07/2012 1128   CL 111 03/02/2021 0429   CO2 23 03/02/2021 0429   GLUCOSE 81 03/02/2021 0429   BUN 18 03/02/2021 0429   CREATININE 1.05 (H) 03/02/2021 0429   CALCIUM 8.1 (L) 03/02/2021 0429   PROT 8.1 12/13/2016 0834   ALBUMIN 3.8 12/13/2016 0834   AST 24 12/13/2016 0834   ALT 17 12/13/2016 0834   ALKPHOS 74 12/13/2016 0834   BILITOT 0.6 12/13/2016 0834   GFRNONAA 54 (L) 03/02/2021 0429   GFRAA >60 12/13/2016 0834     VAS [place] ABI WITH/WO TBI  Result Date: 07/07/2021  LOWER EXTREMITY DOPPLER STUDY Patient Name:  Dawn Ford  Date of Exam:  07/06/2021 Medical Rec #: [id number]      Accession #:    [id number] Date of Birth: 1945-10-20       Patient Gender: F Patient Age:   42 years Exam Location:  [facility]  Procedure:      VAS [place] ABI WITH/WO TBI Referring Phys: Eulogio Ditch --------------------------------------------------------------------------------  High Risk Factors: Hypertension, Diabetes, past history of smoking, coronary                    artery disease.  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 Performing Technologist: Delorise Shiner RVT  Examination Guidelines: A complete evaluation includes at minimum, Doppler waveform signals and systolic blood pressure reading at the level of bilateral brachial, anterior tibial, and posterior tibial arteries, when vessel segments are accessible. Bilateral testing is considered an integral part of a complete examination. Photoelectric Plethysmograph (PPG) waveforms and toe systolic pressure readings are included as required and additional duplex testing as needed. Limited examinations for reoccurring indications may be performed as noted.  ABI Findings: +---------+------------------+-----+---------+--------+ Right    Rt Pressure (mmHg)IndexWaveform Comment  +---------+------------------+-----+---------+--------+ Brachial 176                                      +---------+------------------+-----+---------+--------+ ATA      194               1.10 triphasic         +---------+------------------+-----+---------+--------+ PTA      172               0.98 triphasic         +---------+------------------+-----+---------+--------+ Great Toe136               0.77                   +---------+------------------+-----+---------+--------+  +---------+------------------+-----+---------+-------+ Left     Lt Pressure (mmHg)IndexWaveform Comment +---------+------------------+-----+---------+-------+ Brachial 171                                     +---------+------------------+-----+---------+-------+ ATA      179               1.02 triphasic        +---------+------------------+-----+---------+-------+ PTA      195               1.11  triphasic        +---------+------------------+-----+---------+-------+ Great Toe134               0.76                  +---------+------------------+-----+---------+-------+ +-------+-----------+-----------+------------+------------+ ABI/TBIToday's ABIToday's TBIPrevious ABIPrevious TBI +-------+-----------+-----------+------------+------------+ Right  1.10       0.77                                +-------+-----------+-----------+------------+------------+ Left   1.11       0.76                                +-------+-----------+-----------+------------+------------+  Summary: Right: Resting right ankle-brachial index is within normal range. No evidence of significant right lower extremity arterial disease. The right toe-brachial index is normal. Left: Resting left ankle-brachial index is within normal range. No evidence of significant left lower extremity arterial disease. The left toe-brachial index is normal. *See table(s) above for measurements and observations.  Electronically signed by [name] MD on 07/07/2021 at 11:34:49 AM.    Final        Assessment & Plan:   1. Iliac aneurysm [facility]) Recommend: Patient is status post successful endovascular repair of the right common iliac artery aneurysm   No further intervention is required at this time.   No endoleak is detected and the aneurysm sac is stable.   The patient will continue antiplatelet therapy as prescribed as well as aggressive management of hyperlipidemia. Exercise is again strongly encouraged.     However, endografts require continued surveillance with ultrasound or CT scan. This is mandatory to detect any changes that allow repressurization of the aneurysm sac.  The patient is informed that this would be asymptomatic.   The patient is reminded that lifelong routine surveillance is a necessity with an endograft.  Have the patient return in 6 months  2. Benign essential hypertension Continue antihypertensive medications as already ordered, these medications have been reviewed and there are no changes at this time.   3. Type 2 diabetes mellitus with microalbuminuria, without long-term current use of insulin ([facility]) Continue hypoglycemic medications as already ordered, these medications have been reviewed and there are no changes at this time.  Hgb A1C to be monitored as already arranged by primary service    Current Outpatient Medications on File Prior to Visit  Medication Sig Dispense Refill   aspirin EC 81 MG tablet Take 81 mg by mouth daily.     clopidogrel (PLAVIX) 75 MG tablet Take 1 tablet (75 mg total) by mouth daily at 6 (six) AM. 30 tablet 11   famotidine (PEPCID) 40 MG tablet Take 40 mg by mouth at bedtime.     glimepiride (AMARYL) 4 MG tablet Take 2 mg by mouth 2 (two) times daily.     hydrochlorothiazide (HYDRODIURIL) 25 MG tablet Take 12.5 mg by mouth daily.     HYDROcodone-acetaminophen (NORCO/VICODIN) 5-325 MG tablet Take 1 tablet by mouth every 6 (six) hours as needed for moderate pain. 20 tablet 0   metoprolol tartrate (LOPRESSOR) 50 MG tablet Take 50 mg by mouth 2 (two) times daily.     nitroGLYCERIN (NITROSTAT) 0.4 MG SL tablet Place under the tongue.     ONETOUCH VERIO test strip      pioglitazone (ACTOS) 15 MG tablet Take 15 mg by mouth daily.  ramipril (ALTACE) 10 MG capsule Take 10 mg by mouth daily.     simvastatin (ZOCOR) 40 MG tablet Take 40 mg by mouth at bedtime.     VICTOZA 18 MG/3ML SOPN 1.8 mg every evening.     fexofenadine (ALLEGRA) 180 MG tablet  Take 180 mg by mouth daily as needed.     No current facility-administered medications on file prior to visit.    There are no Patient Instructions on file for this visit. No follow-ups on file.   Kris Hartmann, NP

## 2021-07-24 ENCOUNTER — Ambulatory Visit
Admission: RE | Admit: 2021-07-24 | Discharge: 2021-07-24 | Disposition: A | Discharge status: Home / Self Care | Payer: Medicare Other | Source: Ambulatory Visit | Attending: Internal Medicine | Admitting: Internal Medicine

## 2021-07-24 DIAGNOSIS — Z1231 Encounter for screening mammogram for malignant neoplasm of breast: Secondary | ICD-10-CM | POA: Diagnosis present

## 2021-07-26 ENCOUNTER — Other Ambulatory Visit: Payer: Self-pay | Admitting: Internal Medicine

## 2021-07-26 DIAGNOSIS — R928 Other abnormal and inconclusive findings on diagnostic imaging of breast: Secondary | ICD-10-CM

## 2021-07-26 DIAGNOSIS — N63 Unspecified lump in unspecified breast: Secondary | ICD-10-CM

## 2021-08-09 ENCOUNTER — Ambulatory Visit
Admission: RE | Admit: 2021-08-09 | Discharge: 2021-08-09 | Disposition: A | Discharge status: Home / Self Care | Payer: Medicare Other | Source: Ambulatory Visit | Attending: Internal Medicine | Admitting: Internal Medicine

## 2021-08-09 DIAGNOSIS — R928 Other abnormal and inconclusive findings on diagnostic imaging of breast: Secondary | ICD-10-CM | POA: Diagnosis not present

## 2021-08-09 DIAGNOSIS — N63 Unspecified lump in unspecified breast: Secondary | ICD-10-CM | POA: Diagnosis present

## 2021-10-10 ENCOUNTER — Other Ambulatory Visit: Payer: Self-pay

## 2021-10-10 MED ORDER — AMLODIPINE BESYLATE 5 MG PO TABS
ORAL_TABLET | ORAL | 0 refills | Status: DC
  Filled 2021-10-10 (×3): qty 30, 30d supply, fill #0

## 2021-10-11 ENCOUNTER — Inpatient Hospital Stay
Admission: EM | Admit: 2021-10-11 | Discharge: 2021-10-30 | DRG: 253 | Disposition: A | Discharge status: Home Health | Payer: Medicare Other | Attending: Internal Medicine | Admitting: Internal Medicine

## 2021-10-11 ENCOUNTER — Emergency Department: Payer: Medicare Other

## 2021-10-11 ENCOUNTER — Other Ambulatory Visit: Payer: Self-pay

## 2021-10-11 ENCOUNTER — Encounter: Payer: Self-pay | Admitting: Intensive Care

## 2021-10-11 DIAGNOSIS — E860 Dehydration: Secondary | ICD-10-CM | POA: Diagnosis not present

## 2021-10-11 DIAGNOSIS — I1A0 Resistant hypertension: Secondary | ICD-10-CM | POA: Diagnosis present

## 2021-10-11 DIAGNOSIS — I701 Atherosclerosis of renal artery: Secondary | ICD-10-CM | POA: Diagnosis present

## 2021-10-11 DIAGNOSIS — Z955 Presence of coronary angioplasty implant and graft: Secondary | ICD-10-CM

## 2021-10-11 DIAGNOSIS — T465X6A Underdosing of other antihypertensive drugs, initial encounter: Secondary | ICD-10-CM | POA: Diagnosis not present

## 2021-10-11 DIAGNOSIS — Z7902 Long term (current) use of antithrombotics/antiplatelets: Secondary | ICD-10-CM

## 2021-10-11 DIAGNOSIS — I700 Atherosclerosis of aorta: Secondary | ICD-10-CM | POA: Diagnosis present

## 2021-10-11 DIAGNOSIS — Z992 Dependence on renal dialysis: Secondary | ICD-10-CM

## 2021-10-11 DIAGNOSIS — I160 Hypertensive urgency: Principal | ICD-10-CM

## 2021-10-11 DIAGNOSIS — D631 Anemia in chronic kidney disease: Secondary | ICD-10-CM | POA: Diagnosis present

## 2021-10-11 DIAGNOSIS — I10 Essential (primary) hypertension: Secondary | ICD-10-CM

## 2021-10-11 DIAGNOSIS — Z91128 Patient's intentional underdosing of medication regimen for other reason: Secondary | ICD-10-CM

## 2021-10-11 DIAGNOSIS — E785 Hyperlipidemia, unspecified: Secondary | ICD-10-CM

## 2021-10-11 DIAGNOSIS — Z8249 Family history of ischemic heart disease and other diseases of the circulatory system: Secondary | ICD-10-CM

## 2021-10-11 DIAGNOSIS — I9589 Other hypotension: Secondary | ICD-10-CM | POA: Diagnosis not present

## 2021-10-11 DIAGNOSIS — I77811 Abdominal aortic ectasia: Secondary | ICD-10-CM

## 2021-10-11 DIAGNOSIS — N179 Acute kidney failure, unspecified: Secondary | ICD-10-CM | POA: Diagnosis not present

## 2021-10-11 DIAGNOSIS — I2510 Atherosclerotic heart disease of native coronary artery without angina pectoris: Secondary | ICD-10-CM | POA: Diagnosis present

## 2021-10-11 DIAGNOSIS — N1831 Chronic kidney disease, stage 3a: Secondary | ICD-10-CM

## 2021-10-11 DIAGNOSIS — R0603 Acute respiratory distress: Secondary | ICD-10-CM | POA: Diagnosis not present

## 2021-10-11 DIAGNOSIS — E872 Acidosis, unspecified: Secondary | ICD-10-CM | POA: Diagnosis present

## 2021-10-11 DIAGNOSIS — Z7984 Long term (current) use of oral hypoglycemic drugs: Secondary | ICD-10-CM

## 2021-10-11 DIAGNOSIS — Z7982 Long term (current) use of aspirin: Secondary | ICD-10-CM

## 2021-10-11 DIAGNOSIS — Z79899 Other long term (current) drug therapy: Secondary | ICD-10-CM

## 2021-10-11 DIAGNOSIS — I252 Old myocardial infarction: Secondary | ICD-10-CM

## 2021-10-11 DIAGNOSIS — E1122 Type 2 diabetes mellitus with diabetic chronic kidney disease: Secondary | ICD-10-CM | POA: Diagnosis present

## 2021-10-11 DIAGNOSIS — Z8679 Personal history of other diseases of the circulatory system: Secondary | ICD-10-CM

## 2021-10-11 DIAGNOSIS — R079 Chest pain, unspecified: Secondary | ICD-10-CM

## 2021-10-11 DIAGNOSIS — I129 Hypertensive chronic kidney disease with stage 1 through stage 4 chronic kidney disease, or unspecified chronic kidney disease: Secondary | ICD-10-CM | POA: Diagnosis present

## 2021-10-11 DIAGNOSIS — I6523 Occlusion and stenosis of bilateral carotid arteries: Secondary | ICD-10-CM

## 2021-10-11 DIAGNOSIS — Z7985 Long-term (current) use of injectable non-insulin antidiabetic drugs: Secondary | ICD-10-CM

## 2021-10-11 DIAGNOSIS — Z87891 Personal history of nicotine dependence: Secondary | ICD-10-CM

## 2021-10-11 DIAGNOSIS — I723 Aneurysm of iliac artery: Secondary | ICD-10-CM

## 2021-10-11 DIAGNOSIS — E871 Hypo-osmolality and hyponatremia: Secondary | ICD-10-CM | POA: Diagnosis present

## 2021-10-11 DIAGNOSIS — N280 Ischemia and infarction of kidney: Secondary | ICD-10-CM | POA: Diagnosis not present

## 2021-10-11 DIAGNOSIS — E1129 Type 2 diabetes mellitus with other diabetic kidney complication: Secondary | ICD-10-CM | POA: Diagnosis present

## 2021-10-11 DIAGNOSIS — K219 Gastro-esophageal reflux disease without esophagitis: Secondary | ICD-10-CM | POA: Diagnosis present

## 2021-10-11 DIAGNOSIS — E875 Hyperkalemia: Secondary | ICD-10-CM | POA: Diagnosis present

## 2021-10-11 DIAGNOSIS — N186 End stage renal disease: Secondary | ICD-10-CM

## 2021-10-11 DIAGNOSIS — I169 Hypertensive crisis, unspecified: Principal | ICD-10-CM

## 2021-10-11 DIAGNOSIS — E8770 Fluid overload, unspecified: Secondary | ICD-10-CM | POA: Diagnosis not present

## 2021-10-11 LAB — CBC
HCT: 36.6 % (ref 36.0–46.0)
Hemoglobin: 11.7 g/dL — ABNORMAL LOW (ref 12.0–15.0)
MCH: 28.1 pg (ref 26.0–34.0)
MCHC: 32 g/dL (ref 30.0–36.0)
MCV: 87.8 fL (ref 80.0–100.0)
Platelets: 222 10*3/uL (ref 150–400)
RBC: 4.17 MIL/uL (ref 3.87–5.11)
RDW: 13.7 % (ref 11.5–15.5)
WBC: 7.7 10*3/uL (ref 4.0–10.5)
nRBC: 0 % (ref 0.0–0.2)

## 2021-10-11 LAB — TROPONIN I (HIGH SENSITIVITY)
Troponin I (High Sensitivity): 8 ng/L (ref <18)
Troponin I (High Sensitivity): 8 ng/L (ref <18)

## 2021-10-11 LAB — BASIC METABOLIC PANEL
Anion gap: 8 (ref 5–15)
BUN: 37 mg/dL — ABNORMAL HIGH (ref 8–23)
CO2: 21 mmol/L — ABNORMAL LOW (ref 22–32)
Calcium: 9 mg/dL (ref 8.9–10.3)
Chloride: 107 mmol/L (ref 98–111)
Creatinine, Ser: 2.62 mg/dL — ABNORMAL HIGH (ref 0.44–1.00)
GFR, Estimated: 18 mL/min — ABNORMAL LOW (ref >60)
Glucose, Bld: 218 mg/dL — ABNORMAL HIGH (ref 70–99)
Potassium: 4.8 mmol/L (ref 3.5–5.1)
Sodium: 136 mmol/L (ref 135–145)

## 2021-10-11 LAB — HEMOGLOBIN A1C
Hgb A1c MFr Bld: 5.7 % — ABNORMAL HIGH (ref 4.8–5.6)
Mean Plasma Glucose: 116.89 mg/dL

## 2021-10-11 LAB — CBG MONITORING, ED: Glucose-Capillary: 173 mg/dL — ABNORMAL HIGH (ref 70–99)

## 2021-10-11 MED ORDER — HYDRALAZINE HCL 20 MG/ML IJ SOLN
10.0000 mg | INTRAMUSCULAR | Status: DC | PRN
  Administered 2021-10-13 – 2021-10-28 (×13): 10 mg via INTRAVENOUS
  Filled 2021-10-11 (×14): qty 1

## 2021-10-11 MED ORDER — ATORVASTATIN CALCIUM 20 MG PO TABS
40.0000 mg | ORAL_TABLET | Freq: Every day | ORAL | Status: DC
  Administered 2021-10-11 – 2021-10-29 (×19): 40 mg via ORAL
  Filled 2021-10-11 (×19): qty 2

## 2021-10-11 MED ORDER — AMLODIPINE BESYLATE 5 MG PO TABS
5.0000 mg | ORAL_TABLET | Freq: Once | ORAL | Status: AC
  Administered 2021-10-11: 5 mg via ORAL
  Filled 2021-10-11: qty 1

## 2021-10-11 MED ORDER — LORATADINE 10 MG PO TABS: 10.0000 mg | ORAL_TABLET | Freq: Every day | ORAL | Status: DC | PRN

## 2021-10-11 MED ORDER — SODIUM CHLORIDE 0.9 % IV SOLN: INTRAVENOUS | Status: DC

## 2021-10-11 MED ORDER — FAMOTIDINE 20 MG PO TABS
40.0000 mg | ORAL_TABLET | Freq: Every day | ORAL | Status: DC
  Administered 2021-10-11 – 2021-10-12 (×2): 40 mg via ORAL
  Filled 2021-10-11 (×2): qty 2

## 2021-10-11 MED ORDER — MORPHINE SULFATE (PF) 2 MG/ML IV SOLN: 0.5000 mg | INTRAVENOUS | Status: DC | PRN

## 2021-10-11 MED ORDER — LABETALOL HCL 100 MG PO TABS
100.0000 mg | ORAL_TABLET | Freq: Once | ORAL | Status: DC
  Filled 2021-10-11: qty 1

## 2021-10-11 MED ORDER — ACETAMINOPHEN 325 MG PO TABS
650.0000 mg | ORAL_TABLET | Freq: Four times a day (QID) | ORAL | Status: DC | PRN
  Administered 2021-10-14 – 2021-10-27 (×3): 650 mg via ORAL
  Filled 2021-10-11 (×3): qty 2

## 2021-10-11 MED ORDER — ONDANSETRON HCL 4 MG/2ML IJ SOLN: 4.0000 mg | Freq: Three times a day (TID) | INTRAMUSCULAR | Status: DC | PRN

## 2021-10-11 MED ORDER — HYDRALAZINE HCL 50 MG PO TABS
25.0000 mg | ORAL_TABLET | Freq: Three times a day (TID) | ORAL | Status: DC
  Administered 2021-10-11 – 2021-10-12 (×2): 25 mg via ORAL
  Filled 2021-10-11 (×2): qty 1

## 2021-10-11 MED ORDER — AMLODIPINE BESYLATE 10 MG PO TABS
10.0000 mg | ORAL_TABLET | Freq: Every day | ORAL | Status: DC
  Administered 2021-10-12 – 2021-10-23 (×12): 10 mg via ORAL
  Filled 2021-10-11 (×9): qty 1
  Filled 2021-10-11: qty 2
  Filled 2021-10-11 (×2): qty 1

## 2021-10-11 MED ORDER — HEPARIN SODIUM (PORCINE) 5000 UNIT/ML IJ SOLN
5000.0000 [IU] | Freq: Three times a day (TID) | INTRAMUSCULAR | Status: DC
  Administered 2021-10-11 – 2021-10-30 (×45): 5000 [IU] via SUBCUTANEOUS
  Filled 2021-10-11 (×48): qty 1

## 2021-10-11 MED ORDER — METOPROLOL TARTRATE 50 MG PO TABS
50.0000 mg | ORAL_TABLET | Freq: Two times a day (BID) | ORAL | Status: DC
  Administered 2021-10-11 – 2021-10-12 (×3): 50 mg via ORAL
  Filled 2021-10-11 (×3): qty 1

## 2021-10-11 MED ORDER — ASPIRIN 81 MG PO TBEC
81.0000 mg | DELAYED_RELEASE_TABLET | Freq: Every day | ORAL | Status: DC
  Administered 2021-10-11 – 2021-10-30 (×19): 81 mg via ORAL
  Filled 2021-10-11 (×21): qty 1

## 2021-10-11 MED ORDER — LABETALOL HCL 5 MG/ML IV SOLN
10.0000 mg | Freq: Once | INTRAVENOUS | Status: AC
  Administered 2021-10-11: 10 mg via INTRAVENOUS
  Filled 2021-10-11: qty 4

## 2021-10-11 MED ORDER — INSULIN ASPART 100 UNIT/ML IJ SOLN
0.0000 [IU] | Freq: Every day | INTRAMUSCULAR | Status: DC
  Administered 2021-10-13 – 2021-10-26 (×9): 2 [IU] via SUBCUTANEOUS
  Administered 2021-10-27 – 2021-10-29 (×3): 3 [IU] via SUBCUTANEOUS
  Filled 2021-10-11 (×12): qty 1

## 2021-10-11 MED ORDER — INSULIN ASPART 100 UNIT/ML IJ SOLN
0.0000 [IU] | Freq: Three times a day (TID) | INTRAMUSCULAR | Status: DC
  Administered 2021-10-12 – 2021-10-13 (×3): 3 [IU] via SUBCUTANEOUS
  Administered 2021-10-13 – 2021-10-14 (×2): 2 [IU] via SUBCUTANEOUS
  Administered 2021-10-14: 5 [IU] via SUBCUTANEOUS
  Administered 2021-10-15: 2 [IU] via SUBCUTANEOUS
  Administered 2021-10-15: 3 [IU] via SUBCUTANEOUS
  Administered 2021-10-16: 5 [IU] via SUBCUTANEOUS
  Administered 2021-10-17: 2 [IU] via SUBCUTANEOUS
  Administered 2021-10-17: 5 [IU] via SUBCUTANEOUS
  Administered 2021-10-18 (×2): 3 [IU] via SUBCUTANEOUS
  Administered 2021-10-18: 1 [IU] via SUBCUTANEOUS
  Administered 2021-10-19 – 2021-10-20 (×4): 2 [IU] via SUBCUTANEOUS
  Administered 2021-10-20 (×2): 3 [IU] via SUBCUTANEOUS
  Administered 2021-10-21: 5 [IU] via SUBCUTANEOUS
  Administered 2021-10-21: 2 [IU] via SUBCUTANEOUS
  Administered 2021-10-21: 3 [IU] via SUBCUTANEOUS
  Administered 2021-10-22: 2 [IU] via SUBCUTANEOUS
  Administered 2021-10-22 – 2021-10-23 (×3): 3 [IU] via SUBCUTANEOUS
  Administered 2021-10-23: 2 [IU] via SUBCUTANEOUS
  Administered 2021-10-24: 3 [IU] via SUBCUTANEOUS
  Administered 2021-10-24: 2 [IU] via SUBCUTANEOUS
  Administered 2021-10-25: 3 [IU] via SUBCUTANEOUS
  Administered 2021-10-25: 2 [IU] via SUBCUTANEOUS
  Administered 2021-10-26: 3 [IU] via SUBCUTANEOUS
  Administered 2021-10-26: 2 [IU] via SUBCUTANEOUS
  Administered 2021-10-26: 1 [IU] via SUBCUTANEOUS
  Administered 2021-10-27 (×2): 2 [IU] via SUBCUTANEOUS
  Administered 2021-10-28: 3 [IU] via SUBCUTANEOUS
  Filled 2021-10-11 (×37): qty 1

## 2021-10-11 NOTE — Assessment & Plan Note (Signed)
Recent A1c 6.0, well controlled.  Patient is still taking Victoza, Actos and Amaryl -Sliding scale insulin

## 2021-10-11 NOTE — Assessment & Plan Note (Addendum)
Lipitor 

## 2021-10-11 NOTE — Assessment & Plan Note (Signed)
Chest pain and history of CAD: Her chest pain has resolved.  Troponin negative x2.  Possibly due to demand ischemia secondary to hypertensive urgency. -Blood pressure control as above -As needed nitroglycerin and morphine -Continue aspirin, Lipitor, Toprol -Check A1c, FLP

## 2021-10-11 NOTE — H&P (Signed)
History and Physical    Dawn Ford FMB:[id number] DOB: 05/24/1945 DOA: 10/11/2021  Referring MD/NP/PA:   PCP: Idelle Crouch, MD   Patient coming from:  The patient is coming from home.  At baseline, pt is independent for most of ADL.        Chief Complaint: chest pain  HPI: Dawn Ford is a 76 y.o. 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   Patient states that her blood pressure has not been controlled well recently.  She had elevated blood pressure up to SBP 200 yesterday.  Her cardiologist added amlodipine 5 mg to current regimen (including ramipril, HCTZ and metoprolol).  She states that she had 1 episode of chest pain this morning, which was located in the left side of the chest, moderate, pressure-like, radiating to the left arm and shoulder.  Chest pain has resolved currently.  No shortness breath or cough.  No fever or chills.  No nausea, vomiting, diarrhea or abdominal pain.  No symptoms of UTI.  Her initial blood pressure was 203/68 which improved to 188/64 after given 10 mg of labetalol IV in ED.      Data reviewed independently and ED Course: pt was found to have  trop 8 --> 8.  WBC 7.7, worsening renal function with creatinine 2.62, BUN 37, GFR 18 (baseline creatinine 1.05 on 03/02/2021), temperature normal, heart rate 58, RR 16, oxygen saturation 94% on room air.  Chest x-ray negative.  Patient is placed on telemetry bed for observation.   EKG: I have personally reviewed.  Sinus rhythm, QTc 418, possible left atrial enlargement  Review of Systems:   General: no fevers, chills, no body weight gain, fatigue HEENT: no blurry vision, hearing changes or sore throat Respiratory: no dyspnea, coughing, wheezing CV: has chest pain, no palpitations GI: no nausea, vomiting, abdominal pain, diarrhea, constipation GU: no  dysuria, burning on urination, increased urinary frequency, hematuria  Ext: no leg edema Neuro: no unilateral weakness, numbness, or tingling, no vision change or hearing loss Skin: no rash, no skin tear. MSK: No muscle spasm, no deformity, no limitation of range of movement in spin Heme: No easy bruising.  Travel history: No recent long distant travel.   Allergy: No Known Allergies  Past Medical History:  Diagnosis Date   Aneurysm of right common iliac artery ([place]) 01/19/2021   a.) US aorta 01/19/2021: measure 2.8 cm. b.) CTA abdomen/pelvis 02/13/2021: measured 2.9 x 2.7 cm.   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[place]) 02/13/2021   a.) CTA abdomen/pelvis: measured 2.6 cm   GERD (gastroesophageal reflux disease)    Hyperlipemia    Hypertension    NSTEMI (non-ST elevated myocardial infarction) ([place]) 02/17/1999   a.) LHC 02/17/1999 --> 75% LAD and subtotal RCA with thrombus --> PCI performed placing stent (unknown type) to RCA   T2DM (type 2 diabetes mellitus) ([place])     Past Surgical History:  Procedure Laterality Date   CATARACT EXTRACTION, BILATERAL     COLONOSCOPY WITH PROPOFOL N/A 09/01/2018   Procedure: COLONOSCOPY WITH PROPOFOL;  Surgeon: Toledo, Benay Pike, MD;  Location: [facility] ENDOSCOPY;  Service: Gastroenterology;  Laterality: N/A;   CORONARY ANGIOPLASTY  WITH STENT PLACEMENT     ENDOVASCULAR REPAIR/STENT GRAFT N/A 03/01/2021   Procedure: ENDOVASCULAR REPAIR/STENT GRAFT;  Surgeon: Algernon Huxley, MD;  Location: [place] [facility];  Service: Cardiovascular;  Laterality: N/A;   ESOPHAGOGASTRODUODENOSCOPY (EGD) WITH PROPOFOL N/A 03/13/2017   Procedure: ESOPHAGOGASTRODUODENOSCOPY (EGD) WITH PROPOFOL;  Surgeon: Virgel Manifold, MD;  Location: [facility] ENDOSCOPY;  Service: Endoscopy;  Laterality: N/A;   KNEE  ARTHROSCOPY      Social History:  reports that she quit smoking about 23 years ago. Her smoking use included cigarettes. She has never used smokeless tobacco. She reports current alcohol use. She reports that she does not use drugs.  Family History:  Family History  Problem Relation Age of Onset   Breast cancer Other      Prior to Admission medications   Medication Sig Start Date End Date Taking? Authorizing Provider  amLODipine (NORVASC) 5 MG tablet Take one tablet by mouth once daily 10/10/21     aspirin EC 81 MG tablet Take 81 mg by mouth daily.    [provider]  clopidogrel (PLAVIX) 75 MG tablet Take 1 tablet (75 mg total) by mouth daily at 6 (six) AM. 03/03/21   Kris Hartmann, NP  famotidine (PEPCID) 40 MG tablet Take 40 mg by mouth at bedtime.    [provider]  fexofenadine (ALLEGRA) 180 MG tablet Take 180 mg by mouth daily as needed for allergies.    [provider]  glimepiride (AMARYL) 4 MG tablet Take 2 mg by mouth 2 (two) times daily. 11/05/16   [provider]  hydrochlorothiazide (HYDRODIURIL) 25 MG tablet Take 12.5 mg by mouth daily.    [provider]  HYDROcodone-acetaminophen (NORCO/VICODIN) 5-325 MG tablet Take 1 tablet by mouth every 6 (six) hours as needed for moderate pain. 03/02/21   Kris Hartmann, NP  metoprolol tartrate (LOPRESSOR) 50 MG tablet Take 50 mg by mouth 2 (two) times daily. 02/02/17   [provider]  nitroGLYCERIN (NITROSTAT) 0.4 MG SL tablet Place under the tongue. 07/09/19   [provider]  pioglitazone (ACTOS) 15 MG tablet Take 15 mg by mouth daily. 11/05/16   [provider]  ramipril (ALTACE) 10 MG capsule Take 10 mg by mouth daily. 11/28/16   [provider]  simvastatin (ZOCOR) 40 MG tablet Take 40 mg by mouth at bedtime.    [provider]  VICTOZA 18 MG/3ML SOPN 1.8 mg every evening. 02/10/17   [provider]    Physical Exam: Vitals:    10/11/21 1209 10/11/21 1215 10/11/21 1530 10/11/21 1600  BP:  (!) 203/68 (!) 192/65 (!) 188/64  Pulse:  66 65 (!) 58  Resp:  16 12 14   Temp:  98.5 F (36.9 C)  98.7 F (37.1 C)  TempSrc:  Oral    SpO2:  94% 100% 100%  Weight: 84.8 kg     Height: 5\' 6"  (1.676 m)      General: Not in acute distress HEENT:       Eyes: PERRL, EOMI, no scleral icterus.       ENT: No discharge from the ears and nose, no pharynx injection, no tonsillar enlargement.        Neck: No JVD, no bruit, no mass felt. Heme: No neck lymph node enlargement. Cardiac: S1/S2, RRR, No murmurs, No gallops or rubs. Respiratory: No rales, wheezing, rhonchi or rubs. GI: Soft, nondistended, nontender, no rebound pain, no organomegaly, BS present. GU: No hematuria Ext: No pitting  leg edema bilaterally. 1+DP/PT pulse bilaterally. Musculoskeletal: No joint deformities, No joint redness or warmth, no limitation of ROM in spin. Skin: No rashes.  Neuro: Alert, oriented X3, cranial nerves II-XII grossly intact, moves all extremities normally.  Psych: Patient is not psychotic, no suicidal or hemocidal ideation.  Labs on Admission: I have personally reviewed following labs and imaging studies  CBC: Recent Labs  Lab 10/11/21 1218  WBC 7.7  HGB 11.7*  HCT 36.6  MCV 87.8  PLT 350   Basic Metabolic Panel: Recent Labs  Lab 10/11/21 1218  NA 136  K 4.8  CL 107  CO2 21*  GLUCOSE 218*  BUN 37*  CREATININE 2.62*  CALCIUM 9.0   GFR: Estimated Creatinine Clearance: 19.4 mL/min (A) (by C-G formula based on SCr of 2.62 mg/dL (H)). Liver Function Tests: No results for input(s): "AST", "ALT", "ALKPHOS", "BILITOT", "PROT", "ALBUMIN" in the last 168 hours. No results for input(s): "LIPASE", "AMYLASE" in the last 168 hours. No results for input(s): "AMMONIA" in the last 168 hours. Coagulation Profile: No results for input(s): "INR", "PROTIME" in the last 168 hours. Cardiac Enzymes: No results for input(s): "CKTOTAL", "CKMB",  "CKMBINDEX", "TROPONINI" in the last 168 hours. BNP (last 3 results) No results for input(s): "PROBNP" in the last 8760 hours. HbA1C: No results for input(s): "HGBA1C" in the last 72 hours. CBG: No results for input(s): "GLUCAP" in the last 168 hours. Lipid Profile: No results for input(s): "CHOL", "HDL", "LDLCALC", "TRIG", "CHOLHDL", "LDLDIRECT" in the last 72 hours. Thyroid Function Tests: No results for input(s): "TSH", "T4TOTAL", "FREET4", "T3FREE", "THYROIDAB" in the last 72 hours. Anemia Panel: No results for input(s): "VITAMINB12", "FOLATE", "FERRITIN", "TIBC", "IRON", "RETICCTPCT" in the last 72 hours. Urine analysis:    Component Value Date/Time   COLORURINE YELLOW (A) 03/04/2016 1502   APPEARANCEUR HAZY (A) 03/04/2016 1502   LABSPEC 1.017 03/04/2016 1502   PHURINE 5.0 03/04/2016 1502   GLUCOSEU NEGATIVE 03/04/2016 1502   HGBUR NEGATIVE 03/04/2016 1502   BILIRUBINUR NEGATIVE 03/04/2016 1502   KETONESUR NEGATIVE 03/04/2016 1502   PROTEINUR NEGATIVE 03/04/2016 1502   NITRITE NEGATIVE 03/04/2016 1502   LEUKOCYTESUR MODERATE (A) 03/04/2016 1502   Sepsis Labs: @LABRCNTIP (procalcitonin:4,lacticidven:4) )No results found for this or any previous visit (from the past 240 hour(s)).   Radiological Exams on Admission: DG Chest 2 View  Result Date: 10/11/2021 CLINICAL DATA:  Chest pain. EXAM: CHEST - 2 VIEW COMPARISON:  December 13, 2016. FINDINGS: The heart size and mediastinal contours are within normal limits. Both lungs are clear. The visualized skeletal structures are unremarkable. IMPRESSION: No active cardiopulmonary disease. Aortic Atherosclerosis (ICD10-I70.0). Electronically Signed   By: Marijo Conception M.D.   On: 10/11/2021 12:55      Assessment/Plan Principal Problem:   Hypertensive urgency Active Problems:   Coronary artery disease   Chest pain   Acute renal failure superimposed on stage 3a chronic kidney disease (HCC)   Bilateral carotid artery stenosis    Hyperlipemia   Type II diabetes mellitus with renal manifestations (HCC)   Ectatic abdominal aorta (HCC)   Iliac artery aneurysm (HCC)   Assessment and Plan: * Hypertensive urgency Initially blood pressure 203/68, which improved to 188/64 after giving 10 mg of IV labetalol.  Heart rate 58, will not give more IV beta-blocker.  -Placed on telemetry bed for position -Increase home amlodipine dose from 5 to 10 mg daily -Continue home metoprolol 50 mg twice daily -Hold ramipril and HCTZ due to worsening renal function -Start oral hydralazine  25 mg 3 times daily -IV hydralazine as needed  Coronary artery disease Chest pain and history of CAD: Her chest pain has resolved.  Troponin negative x2.  Possibly due to demand ischemia secondary to hypertensive urgency. -Blood pressure control as above -As needed nitroglycerin and morphine -Continue aspirin, Lipitor, Toprol -Check A1c, FLP  Chest pain - See above  Acute renal failure superimposed on stage 3a chronic kidney disease ([place]) Possibly due to dehydration and continuation for ramipril and HCTZ.  Hypertensive urgency may have contributed partially.  -Hold ramipril and HCTZ -IV normal saline 75 cc/h for 6 hours -Follow-up renal function by BMP  Bilateral carotid artery stenosis - Continue aspirin and Lipitor  Hyperlipemia - Lipitor  Type II diabetes mellitus with renal manifestations (HCC) Recent A1c 6.0, well controlled.  Patient is still taking Victoza, Actos and Amaryl -Sliding scale insulin  Ectatic abdominal aorta (HCC) - Follow-up with PCP  Iliac artery aneurysm ([place]) -f/u with PCP          DVT ppx: SQ Heparin    Code Status: Full code  Family Communication: I offered to call her family, but the patient states that I do not need to call her family since she already called her brother and 3 niece  Disposition Plan:  Anticipate discharge back to previous environment  Consults called:  none  Admission  status and Level of care: Telemetry Cardiac:    for obs      Dispo: The patient is from: Home              Anticipated d/c is to: Home              Anticipated d/c date is: 1 day              Patient currently is not medically stable to d/c.    Severity of Illness:  The appropriate patient status for this patient is OBSERVATION. Observation status is judged to be reasonable and necessary in order to provide the required intensity of service to ensure the patient's safety. The patient's presenting symptoms, physical exam findings, and initial radiographic and laboratory data in the context of their medical condition is felt to place them at decreased risk for further clinical deterioration. Furthermore, it is anticipated that the patient will be medically stable for discharge from the hospital within 2 midnights of admission.        Date of Service 10/11/2021    Ivor Costa [facility]   If 7PM-7AM, please contact night-coverage [email or web address] 10/11/2021, 5:55 PM

## 2021-10-11 NOTE — Assessment & Plan Note (Signed)
See above

## 2021-10-11 NOTE — Assessment & Plan Note (Signed)
Continue Lipitor. Aspirin held secondary to initiation of Eliquis. Follow-up with cardiology. 

## 2021-10-11 NOTE — Assessment & Plan Note (Signed)
Follow up with PCP

## 2021-10-11 NOTE — ED Provider Notes (Addendum)
[facility] Provider Note    Event Date/Time   First MD Initiated Contact with Patient 10/11/21 1507     (approximate)   History   Chest Pain and Hypertension   HPI  Dawn Ford is a 76 y.o. female   Past medical history of right common iliac artery aneurysm status post repair, NSTEMI, hyperlipidemia hypertension, type 2 diabetes who presents chest pain substernal radiating to left arm that was transient in nature, nonexertional, intermittent lasting seconds at a time before resolving spontaneously.  Checked at her cardiologist yesterday for high blood pressure systolics 297 and added amlodipine in addition to her existing antihypertensives.  She has been compliant with her medications since then.  She is noted to be hypertensive 989Q systolic over 60.  She reports no longer has chest pain and feels well at this time.   Strays obtained by the patient and a review of external medical notes including discharge summary in February 2023 for aneurysm repair.     Physical Exam   Triage Vital Signs: ED Triage Vitals  Enc Vitals Group     BP 10/11/21 1215 (!) 203/68     Pulse Rate 10/11/21 1215 66     Resp 10/11/21 1215 16     Temp 10/11/21 1215 98.5 F (36.9 C)     Temp Source 10/11/21 1215 Oral     SpO2 10/11/21 1215 94 %     Weight 10/11/21 1209 187 lb (84.8 kg)     Height 10/11/21 1209 5\' 6"  (1.676 m)     Head Circumference --      Peak Flow --      Pain Score 10/11/21 1209 3     Pain Loc --      Pain Edu? --      Excl. in [place]? --     Most recent vital signs: Vitals:   10/11/21 1530 10/11/21 1600  BP: (!) 192/65 (!) 188/64  Pulse: 65 (!) 58  Resp: 12 14  Temp:  98.7 F (37.1 C)  SpO2: 100% 100%    General: Awake, no distress.  CV:  Good peripheral perfusion.  Resp:  Normal effort. clear Abd:  No distention. nontender Other:  HTN 200/60   ED Results / Procedures / Treatments   Labs (all labs ordered are listed, but only  abnormal results are displayed) Labs Reviewed  BASIC METABOLIC PANEL - Abnormal; Notable for the following components:      Result Value   CO2 21 (*)    Glucose, Bld 218 (*)    BUN 37 (*)    Creatinine, Ser 2.62 (*)    GFR, Estimated 18 (*)    All other components within normal limits  CBC - Abnormal; Notable for the following components:   Hemoglobin 11.7 (*)    All other components within normal limits  URINALYSIS, ROUTINE W REFLEX MICROSCOPIC  TROPONIN I (HIGH SENSITIVITY)  TROPONIN I (HIGH SENSITIVITY)     I reviewed labs and they are notable for Cr 2.62, up from June 2012 in low 1s from [place] system labs  EKG  ED ECG REPORT I, Lucillie Garfinkel, the attending physician, personally viewed and interpreted this ECG.   Date: 10/11/2021  EKG Time: 1213  Rate: 67  Rhythm: nsr   Axis: normal  Intervals:none  ST&T Change: no ischemic changes    RADIOLOGY I independently reviewed and interpreted cxr and see no obvious focal opacities or pneumothorax   PROCEDURES:  Critical Care  performed: No  Procedures   MEDICATIONS ORDERED IN ED: Medications  labetalol (NORMODYNE) tablet 100 mg (has no administration in time range)  labetalol (NORMODYNE) injection 10 mg (10 mg Intravenous Given 10/11/21 1606)    Consultants:  I spoke with hospitalist for admission and regarding care plan for this patient.   IMPRESSION / MDM / ASSESSMENT AND PLAN / ED COURSE  I reviewed the triage vital signs and the nursing notes.                              Differential diagnosis includes, but is not limited to, hypertensive tensive crisis, ACS,   The patient is on the cardiac monitor to evaluate for evidence of arrhythmia and/or significant heart rate changes.  MDM: Cr more than doubled from prior, signs of endorgan damage in the setting of uncontrolled blood pressure.  Responsive to labetalol IV 10 mg will chase with p.o. 100 mg.  Patient asymptomatic at this time.  Admit for hypertensive  crisis.  Patient's presentation is most consistent with acute presentation with potential threat to life or bodily function.       FINAL CLINICAL IMPRESSION(S) / ED DIAGNOSES   Final diagnoses:  Hypertensive crisis     Rx / DC Orders   ED Discharge Orders     None        Note:  This document was prepared using Dragon voice recognition software and may include unintentional dictation errors.    Lucillie Garfinkel, MD 10/11/21 1640    Lucillie Garfinkel, MD 10/11/21 [phone]

## 2021-10-11 NOTE — Assessment & Plan Note (Signed)
-  f/u with PCP

## 2021-10-11 NOTE — ED Triage Notes (Signed)
Patient c/o left sided chest pain, left arm pain/tingling, and hypertension that started yesterday morning.

## 2021-10-11 NOTE — Assessment & Plan Note (Signed)
Possibly due to dehydration and continuation for ramipril and HCTZ.  Hypertensive urgency may have contributed partially.  -Hold ramipril and HCTZ -IV normal saline 75 cc/h for 6 hours -Follow-up renal function by BMP

## 2021-10-11 NOTE — Assessment & Plan Note (Signed)
Initially blood pressure 203/68, which improved to 188/64 after giving 10 mg of IV labetalol.  Heart rate 58, will not give more IV beta-blocker.  -Placed on telemetry bed for position -Increase home amlodipine dose from 5 to 10 mg daily -Continue home metoprolol 50 mg twice daily -Hold ramipril and HCTZ due to worsening renal function -Start oral hydralazine 25 mg 3 times daily -IV hydralazine as needed

## 2021-10-12 DIAGNOSIS — I160 Hypertensive urgency: Secondary | ICD-10-CM | POA: Diagnosis not present

## 2021-10-12 LAB — CBG MONITORING, ED
Glucose-Capillary: 90 mg/dL (ref 70–99)
Glucose-Capillary: 220 mg/dL — ABNORMAL HIGH (ref 70–99)

## 2021-10-12 LAB — PROTIME-INR
INR: 1.1 (ref 0.8–1.2)
Prothrombin Time: 13.9 seconds (ref 11.4–15.2)

## 2021-10-12 LAB — BASIC METABOLIC PANEL
Anion gap: 7 (ref 5–15)
BUN: 40 mg/dL — ABNORMAL HIGH (ref 8–23)
CO2: 24 mmol/L (ref 22–32)
Calcium: 8.5 mg/dL — ABNORMAL LOW (ref 8.9–10.3)
Chloride: 109 mmol/L (ref 98–111)
Creatinine, Ser: 2.34 mg/dL — ABNORMAL HIGH (ref 0.44–1.00)
GFR, Estimated: 21 mL/min — ABNORMAL LOW (ref >60)
Glucose, Bld: 85 mg/dL (ref 70–99)
Potassium: 4.2 mmol/L (ref 3.5–5.1)
Sodium: 140 mmol/L (ref 135–145)

## 2021-10-12 LAB — GLUCOSE, CAPILLARY
Glucose-Capillary: 86 mg/dL (ref 70–99)
Glucose-Capillary: 197 mg/dL — ABNORMAL HIGH (ref 70–99)

## 2021-10-12 LAB — LIPID PANEL
Cholesterol: 105 mg/dL (ref 0–200)
HDL: 33 mg/dL — ABNORMAL LOW (ref >40)
LDL Cholesterol: 61 mg/dL (ref 0–99)
Total CHOL/HDL Ratio: 3.2 RATIO
Triglycerides: 57 mg/dL (ref <150)
VLDL: 11 mg/dL (ref 0–40)

## 2021-10-12 MED ORDER — HYDRALAZINE HCL 50 MG PO TABS
50.0000 mg | ORAL_TABLET | Freq: Three times a day (TID) | ORAL | Status: DC
  Administered 2021-10-12 – 2021-10-13 (×3): 50 mg via ORAL
  Filled 2021-10-12 (×3): qty 1

## 2021-10-12 MED ORDER — SODIUM CHLORIDE 0.9 % IV SOLN: Freq: Once | INTRAVENOUS | Status: AC

## 2021-10-12 NOTE — ED Notes (Signed)
Pt states coming in due to high blood pressure. Pt states BP was 190/100 yesterday. Pt up and walking to the bathroom with no assistance, steady on her feet.

## 2021-10-12 NOTE — ED Notes (Signed)
Koren Bound, RN messaged to inform her this pt will come to her room when clean.

## 2021-10-12 NOTE — Progress Notes (Signed)
[facility] NAME: Dawn Ford    MR#:  [id number]  DATE OF BIRTH:  Jan 04, 1946  SUBJECTIVE:   Patient seen earlier in the emergency room. Came in after noticing decreased blood pressure on several occasions at home recently. Does not want salt in her diet. Denies any chest pain headache or shortness of breath. Blood pressure improved after PO and IV meds. No family at bedside   VITALS:  Blood pressure (!) 171/67, pulse 66, temperature 98.6 F (37 C), resp. rate 18, height 5\' 6"  (1.676 m), weight 84.8 kg, SpO2 99 %.  PHYSICAL EXAMINATION:   GENERAL:  76 y.o.-year-old patient lying in the bed with no acute distress.  LUNGS: Normal breath sounds bilaterally, no wheezing CARDIOVASCULAR: S1, S2 normal. No murmurs,   ABDOMEN: Soft, nontender, nondistended. Bowel sounds present.  EXTREMITIES: No  edema b/l.    NEUROLOGIC: nonfocal  patient is alert and awake SKIN: No obvious rash, lesion, or ulcer.   LABORATORY PANEL:  CBC Recent Labs  Lab 10/11/21 1218  WBC 7.7  HGB 11.7*  HCT 36.6  PLT 222    Chemistries  Recent Labs  Lab 10/12/21 0549  NA 140  K 4.2  CL 109  CO2 24  GLUCOSE 85  BUN 40*  CREATININE 2.34*  CALCIUM 8.5*   Cardiac Enzymes No results for input(s): "TROPONINI" in the last 168 hours. RADIOLOGY:  DG Chest 2 View  Result Date: 10/11/2021 CLINICAL DATA:  Chest pain. EXAM: CHEST - 2 VIEW COMPARISON:  December 13, 2016. FINDINGS: The heart size and mediastinal contours are within normal limits. Both lungs are clear. The visualized skeletal structures are unremarkable. IMPRESSION: No active cardiopulmonary disease. Aortic Atherosclerosis (ICD10-I70.0). Electronically Signed   By: Marijo Conception M.D.   On: 10/11/2021 12:55    Assessment and Plan  Dawn Ford is a 76 y.o. female with medical history significant of HTN,  HLD, DM, CAD, NSTEMI, bilateral carotid artery stenosis, CKD-3A, cctatic abdominal aorta  and iliac artery aneurysm, who presents with chest pain.    Patient states that her blood pressure has not been controlled well recently.  She had elevated blood pressure up to SBP 200 yesterday.  Her cardiologist added amlodipine 5 mg to current regimen (including ramipril, HCTZ and metoprolol).    *Hypertensive urgency Initially blood pressure 203/68, which improved to 188/64 after giving 10 mg of IV labetalol.  Heart rate 58, will not give more IV beta-blocker.  -Increase home amlodipine dose from 5 to 10 mg daily -Continue home metoprolol 50 mg twice daily -Hold ramipril and HCTZ due to worsening renal function -Start oral hydralazine 50 mg 3 times daily -IV hydralazine as needed   Coronary artery disease Chest pain and history of CAD: Her chest pain has resolved.  Troponin negative x2.  Possibly due to demand ischemia secondary to hypertensive urgency. -Blood pressure control as above -As needed nitroglycerin and morphine -Continue aspirin, Lipitor, Toprol   Chest pain - See above   Acute renal failure superimposed on stage 3a chronic kidney disease ([place]) Possibly due to dehydration and continuation for ramipril and HCTZ.  Hypertensive urgency may have contributed partially. -Hold ramipril and HCTZ -IV normal saline 75 cc/h for 6 hours --baseline creat 1.02 (feb 2023) --came in with creat 2.6--2.3 -- discussed with patient to have follow-up with nephrology as outpatient. -- Renal duplex ultrasound.   Bilateral carotid artery stenosis - Continue aspirin and Lipitor  Hyperlipemia - Lipitor   Type II diabetes mellitus with renal manifestations (HCC) Recent A1c 6.0, well controlled.  Patient is still taking Victoza, Actos and Amaryl -Sliding scale insulin   Ectatic abdominal aorta (HCC) - Follow-up with PCP   Iliac artery aneurysm ([place]) -f/u with PCP    Procedures:none Family communication :none Consults :none CODE STATUS: full DVT Prophylaxis :heparin Level of  care: Telemetry Cardiac Status is: Observation The patient remains OBS appropriate and will d/c before 2 midnights.    TOTAL TIME TAKING CARE OF THIS PATIENT: 35 minutes.  >50% time spent on counselling and coordination of care  Note: This dictation was prepared with Dragon dictation along with smaller phrase technology. Any transcriptional errors that result from this process are unintentional.  Fritzi Mandes M.D    [facility]   CC: Primary care physician; Idelle Crouch, MD

## 2021-10-13 ENCOUNTER — Observation Stay: Payer: Medicare Other

## 2021-10-13 DIAGNOSIS — I701 Atherosclerosis of renal artery: Secondary | ICD-10-CM | POA: Diagnosis not present

## 2021-10-13 DIAGNOSIS — Z87891 Personal history of nicotine dependence: Secondary | ICD-10-CM

## 2021-10-13 DIAGNOSIS — I1A0 Resistant hypertension: Secondary | ICD-10-CM

## 2021-10-13 DIAGNOSIS — Z9889 Other specified postprocedural states: Secondary | ICD-10-CM

## 2021-10-13 DIAGNOSIS — I723 Aneurysm of iliac artery: Secondary | ICD-10-CM | POA: Diagnosis not present

## 2021-10-13 DIAGNOSIS — N179 Acute kidney failure, unspecified: Secondary | ICD-10-CM | POA: Diagnosis not present

## 2021-10-13 DIAGNOSIS — E119 Type 2 diabetes mellitus without complications: Secondary | ICD-10-CM

## 2021-10-13 DIAGNOSIS — I160 Hypertensive urgency: Secondary | ICD-10-CM | POA: Diagnosis not present

## 2021-10-13 LAB — BASIC METABOLIC PANEL
Anion gap: 7 (ref 5–15)
BUN: 40 mg/dL — ABNORMAL HIGH (ref 8–23)
CO2: 22 mmol/L (ref 22–32)
Calcium: 8.7 mg/dL — ABNORMAL LOW (ref 8.9–10.3)
Chloride: 109 mmol/L (ref 98–111)
Creatinine, Ser: 2.47 mg/dL — ABNORMAL HIGH (ref 0.44–1.00)
GFR, Estimated: 19 mL/min — ABNORMAL LOW (ref >60)
Glucose, Bld: 117 mg/dL — ABNORMAL HIGH (ref 70–99)
Potassium: 4.3 mmol/L (ref 3.5–5.1)
Sodium: 138 mmol/L (ref 135–145)

## 2021-10-13 LAB — GLUCOSE, CAPILLARY
Glucose-Capillary: 157 mg/dL — ABNORMAL HIGH (ref 70–99)
Glucose-Capillary: 242 mg/dL — ABNORMAL HIGH (ref 70–99)
Glucose-Capillary: 219 mg/dL — ABNORMAL HIGH (ref 70–99)
Glucose-Capillary: 203 mg/dL — ABNORMAL HIGH (ref 70–99)

## 2021-10-13 MED ORDER — SODIUM CHLORIDE 0.9 % IV SOLN: INTRAVENOUS | Status: AC

## 2021-10-13 MED ORDER — METOPROLOL TARTRATE 50 MG PO TABS
75.0000 mg | ORAL_TABLET | Freq: Two times a day (BID) | ORAL | Status: DC
  Administered 2021-10-13 – 2021-10-19 (×13): 75 mg via ORAL
  Filled 2021-10-13 (×13): qty 1

## 2021-10-13 MED ORDER — FAMOTIDINE 20 MG PO TABS
20.0000 mg | ORAL_TABLET | Freq: Every day | ORAL | Status: DC
  Administered 2021-10-13 – 2021-10-29 (×17): 20 mg via ORAL
  Filled 2021-10-13 (×17): qty 1

## 2021-10-13 MED ORDER — HYDRALAZINE HCL 50 MG PO TABS
50.0000 mg | ORAL_TABLET | Freq: Three times a day (TID) | ORAL | Status: DC
  Administered 2021-10-13 – 2021-10-14 (×3): 50 mg via ORAL
  Filled 2021-10-13 (×3): qty 1

## 2021-10-13 NOTE — Consult Note (Signed)
[facility]  CONSULT NOTE    Date: 10/13/2021                  Patient Name:  Dawn Ford  MRN: [id number]  DOB: April 12, 1945  Age / Sex: 76 y.o., female         PCP: Idelle Crouch, MD                 Service Requesting Consult: [facility]                 Reason for Consult: Acute Kidney injury            History of Present Illness: Dawn Ford is a 76 y.o.  female with past medical conditions including diabetes, CAD, hypertension, bilateral carotid artery stenosis, abdominal aorta and iliac artery aneurysm, hyperlipidemia, and chronic kidney disease stage IIIa, who was admitted to [facility] on 10/11/2021 for Hypertensive crisis [I16.9] Hypertensive urgency [I16.0] Hypertension, unspecified type [I10]  Patient has presented to ED with uncontrolled hypertension. Cardiologist recently added Amlodipine to his current regimen. Patient is seen resting in bed, after ultrasound. Alert and oriented, but confused at times during our visit. She states her blood pressure remained elevated. Chart review states she experienced one episode of chest pain with radiation to left arm and shoulder. Currently denies chest pain. Denies nausea and vomiting. Denies shortness of breath.   Labs on ED arrival significant for BUN 40, creatinine 2.34 with GFR 21. Chest xray negative for acute changes.    Medications: Outpatient medications: Medications Prior to Admission  Medication Sig Dispense Refill Last Dose   amLODipine (NORVASC) 5 MG tablet Take one tablet by mouth once daily 30 tablet 0 10/11/2021 at 0930   aspirin EC 81 MG tablet Take 81 mg by mouth daily.   10/11/2021 at 0900   atorvastatin (LIPITOR) 40 MG tablet Take 40 mg by mouth every evening.   10/10/2021 at 2000   famotidine (PEPCID) 40 MG tablet Take 40 mg by mouth at bedtime.   10/10/2021 at 2000   fexofenadine (ALLEGRA) 180 MG tablet Take 180 mg by mouth daily as needed for allergies.   Unknown at PRN   glimepiride (AMARYL)  4 MG tablet Take 2 mg by mouth 2 (two) times daily.   10/11/2021 at 0900   hydrochlorothiazide (HYDRODIURIL) 25 MG tablet Take 12.5 mg by mouth daily.   10/11/2021 at 0900   liraglutide (VICTOZA) 18 MG/3ML SOPN Inject 1.8 mg into the skin every evening.   10/10/2021 at 2000   metoprolol tartrate (LOPRESSOR) 50 MG tablet Take 50 mg by mouth 2 (two) times daily.   10/11/2021 at 0900   pioglitazone (ACTOS) 15 MG tablet Take 7.5 mg by mouth daily.   10/11/2021 at 0900   ramipril (ALTACE) 10 MG capsule Take 10 mg by mouth daily.   10/11/2021 at 0900    Current medications: Current Facility-Administered Medications  Medication Dose Route Frequency Provider Last Rate Last Admin   acetaminophen (TYLENOL) tablet 650 mg  650 mg Oral Q6H PRN Ivor Costa, MD       amLODipine (NORVASC) tablet 10 mg  10 mg Oral Daily Ivor Costa, MD   10 mg at 10/13/21 1027   aspirin EC tablet 81 mg  81 mg Oral Daily Ivor Costa, MD   81 mg at 10/13/21 1027   atorvastatin (LIPITOR) tablet 40 mg  40 mg Oral Gordan Payment, MD   40 mg at 10/12/21 2111  famotidine (PEPCID) tablet 40 mg  40 mg Oral QHS Ivor Costa, MD   40 mg at 10/12/21 2112   heparin injection 5,000 Units  5,000 Units Subcutaneous Cleophas Dunker, MD   5,000 Units at 10/13/21 0510   hydrALAZINE (APRESOLINE) injection 10 mg  10 mg Intravenous Q2H PRN Ivor Costa, MD   10 mg at 10/13/21 4496   hydrALAZINE (APRESOLINE) tablet 50 mg  50 mg Oral TID Fritzi Mandes, MD       insulin aspart (novoLOG) injection 0-5 Units  0-5 Units Subcutaneous QHS Ivor Costa, MD       insulin aspart (novoLOG) injection 0-9 Units  0-9 Units Subcutaneous TID WC Ivor Costa, MD   3 Units at 10/13/21 1200   loratadine (CLARITIN) tablet 10 mg  10 mg Oral Daily PRN Ivor Costa, MD       metoprolol tartrate (LOPRESSOR) tablet 75 mg  75 mg Oral BID Fritzi Mandes, MD   75 mg at 10/13/21 1027   ondansetron (ZOFRAN) injection 4 mg  4 mg Intravenous Q8H PRN Ivor Costa, MD          Allergies: No Known  Allergies    Past Medical History: Past Medical History:  Diagnosis Date   Aneurysm of right common iliac artery ([place]) 01/19/2021   a.) US aorta 01/19/2021: measure 2.8 cm. b.) CTA abdomen/pelvis 02/13/2021: measured 2.9 x 2.7 cm.   Anginal pain ([place])    Aortic atherosclerosis (HCC)    Bilateral carotid artery stenosis    Coronary artery disease    DDD (degenerative disc disease), lumbar    Diverticulosis    Ectatic abdominal aorta ([place]) 02/13/2021   a.) CTA abdomen/pelvis: measured 2.6 cm   GERD (gastroesophageal reflux disease)    Hyperlipemia    Hypertension    NSTEMI (non-ST elevated myocardial infarction) ([place]) 02/17/1999   a.) LHC 02/17/1999 --> 75% LAD and subtotal RCA with thrombus --> PCI performed placing stent (unknown type) to RCA   T2DM (type 2 diabetes mellitus) ([place])      Past Surgical History: Past Surgical History:  Procedure Laterality Date   CATARACT EXTRACTION, BILATERAL     COLONOSCOPY WITH PROPOFOL N/A 09/01/2018   Procedure: COLONOSCOPY WITH PROPOFOL;  Surgeon: Toledo, Benay Pike, MD;  Location: [facility] ENDOSCOPY;  Service: Gastroenterology;  Laterality: N/A;   CORONARY ANGIOPLASTY WITH STENT PLACEMENT     ENDOVASCULAR REPAIR/STENT GRAFT N/A 03/01/2021   Procedure: ENDOVASCULAR REPAIR/STENT GRAFT;  Surgeon: Algernon Huxley, MD;  Location: [place] [facility];  Service: Cardiovascular;  Laterality: N/A;   ESOPHAGOGASTRODUODENOSCOPY (EGD) WITH PROPOFOL N/A 03/13/2017   Procedure: ESOPHAGOGASTRODUODENOSCOPY (EGD) WITH PROPOFOL;  Surgeon: Virgel Manifold, MD;  Location: [facility] ENDOSCOPY;  Service: Endoscopy;  Laterality: N/A;   KNEE ARTHROSCOPY       Family History: Family History  Problem Relation Age of Onset   Breast cancer Other      Social History: Social History   Socioeconomic History   Marital status: Single    Spouse name: Not on file   Number of children: Not on file   Years of education: Not on file   Highest education level: Not on  file  Occupational History   Not on file  Tobacco Use   Smoking status: Former    Types: Cigarettes    Quit date: 2000    Years since quitting: 23.7   Smokeless tobacco: Never  Vaping Use   Vaping Use: Never used  Substance and Sexual Activity   Alcohol  use: Yes    Comment: rarely   Drug use: No   Sexual activity: Not on file  Other Topics Concern   Not on file  Social History Narrative   Not on file   Social Determinants of Health   Financial Resource Strain: Not on file  Food Insecurity: No Food Insecurity (10/12/2021)   Hunger Vital Sign    Worried About Running Out of Food in the Last Year: Never true    Ran Out of Food in the Last Year: Never true  Transportation Needs: No Transportation Needs (10/12/2021)   PRAPARE - Hydrologist (Medical): No    Lack of Transportation (Non-Medical): No  Physical Activity: Not on file  Stress: Not on file  Social Connections: Not on file  Intimate Partner Violence: Not At Risk (10/12/2021)   Humiliation, Afraid, Rape, and Kick questionnaire    Fear of Current or Ex-Partner: No    Emotionally Abused: No    Physically Abused: No    Sexually Abused: No     Review of Systems: Review of Systems  Constitutional:  Negative for chills, fever and malaise/fatigue.  HENT:  Negative for congestion, sore throat and tinnitus.   Eyes:  Negative for blurred vision and redness.  Respiratory:  Negative for cough, shortness of breath and wheezing.   Cardiovascular:  Positive for chest pain. Negative for palpitations, claudication and leg swelling.  Gastrointestinal:  Negative for abdominal pain, blood in stool, diarrhea, nausea and vomiting.  Genitourinary:  Negative for flank pain, frequency and hematuria.  Musculoskeletal:  Negative for back pain, falls and myalgias.  Skin:  Negative for rash.  Neurological:  Negative for dizziness, weakness and headaches.  Endo/Heme/Allergies:  Does not bruise/bleed easily.   Psychiatric/Behavioral:  Negative for depression. The patient is not nervous/anxious and does not have insomnia.     Vital Signs: Blood pressure (!) 128/103, pulse 73, temperature 99.1 F (37.3 C), resp. rate 16, height 5\' 6"  (1.676 m), weight 85.1 kg, SpO2 98 %.  Weight trends: Filed Weights   10/11/21 1209 10/12/21 1355  Weight: 84.8 kg 85.1 kg    Physical Exam: General: NAD, resting comfortably  Head: Normocephalic, atraumatic. Moist oral mucosal membranes  Eyes: Anicteric  Lungs:  Clear to auscultation, normal effort  Heart: Regular rate and rhythm  Abdomen:  Soft, nontender  Extremities:  No peripheral edema.  Neurologic: Nonfocal, moving all four extremities  Skin: No lesions  Access: None     Lab results: Basic Metabolic Panel: Recent Labs  Lab 10/11/21 1218 10/12/21 0549 10/13/21 0501  NA 136 140 138  K 4.8 4.2 4.3  CL 107 109 109  CO2 21* 24 22  GLUCOSE 218* 85 117*  BUN 37* 40* 40*  CREATININE 2.62* 2.34* 2.47*  CALCIUM 9.0 8.5* 8.7*    Liver Function Tests: No results for input(s): "AST", "ALT", "ALKPHOS", "BILITOT", "PROT", "ALBUMIN" in the last 168 hours. No results for input(s): "LIPASE", "AMYLASE" in the last 168 hours. No results for input(s): "AMMONIA" in the last 168 hours.  CBC: Recent Labs  Lab 10/11/21 1218  WBC 7.7  HGB 11.7*  HCT 36.6  MCV 87.8  PLT 222    Cardiac Enzymes: No results for input(s): "CKTOTAL", "CKMB", "CKMBINDEX", "TROPONINI" in the last 168 hours.  BNP: Invalid input(s): "POCBNP"  CBG: Recent Labs  Lab 10/12/21 1143 10/12/21 1716 10/12/21 2040 10/13/21 0820 10/13/21 1141  GLUCAP 220* 86 197* 157* 242*    Microbiology: Results for  orders placed or performed during the hospital encounter of 03/01/21  MRSA Next Gen by PCR, Nasal     Status: None   Collection Time: 03/01/21  4:14 PM   Specimen: Nasal Mucosa; Nasal Swab  Result Value Ref Range Status   MRSA by PCR Next Gen NOT DETECTED NOT DETECTED  Final    Comment: (NOTE) The GeneXpert MRSA Assay (FDA approved for NASAL specimens only), is one component of a comprehensive MRSA colonization surveillance program. It is not intended to diagnose MRSA infection nor to guide or monitor treatment for MRSA infections. Test performance is not FDA approved in patients less than 78 years old. Performed at [facility], [address] [place], [place] [address]     Coagulation Studies: Recent Labs    10/12/21 1408  LABPROT 13.9  INR 1.1    Urinalysis: No results for input(s): "COLORURINE", "LABSPEC", "PHURINE", "GLUCOSEU", "HGBUR", "BILIRUBINUR", "KETONESUR", "PROTEINUR", "UROBILINOGEN", "NITRITE", "LEUKOCYTESUR" in the last 72 hours.  Invalid input(s): "APPERANCEUR"    Imaging: No results found.   Assessment & Plan: Dawn Ford is a 76 y.o.  female with past medical conditions including diabetes, CAD, hypertension, bilateral carotid artery stenosis, abdominal aorta and iliac artery aneurysm, hyperlipidemia, and chronic kidney disease stage IIIa, who was admitted to [facility] on 10/11/2021 for Hypertensive crisis [I16.9] Hypertensive urgency [I16.0] Hypertension, unspecified type [I10]  Acute kidney injury on chronic kidney disease stage IIIa.  Baseline creatinine appears to be 1.1 with GFR 58 on 06/29/2021.  CT angio abdomen pelvis from February 13, 2021 shows suspected significant narrowing in both renal arteries along with large amount of calcified atherosclerotic plaque within the abdominal aorta.  Renal artery duplex ultrasound pending.  Creatinine 2.62 on admission.  No acute need for dialysis at this time.  Continue to avoid nephrotoxic agents and therapies.  Vascular surgery consulted to evaluate renal artery stenosis.  We will monitor with IV fluids through the weekend.  Hypertensive urgency with chronic kidney disease.  Blood pressure on ED arrival 203/68.  Home regimen includes hydrochlorothiazide, metoprolol,  ramipril, and recently added amlodipine.  Currently receiving these medications except ramipril.  Primary team has recently increased amlodipine and will hold HCTZ due to AKI.   3. Anemia of chronic kidney disease Lab Results  Component Value Date   HGB 11.7 (L) 10/11/2021    Hemoglobin within desired target.  We will continue to monitor  LOS: 0 Tomoya Ringwald 10/6/20231:23 PM

## 2021-10-13 NOTE — Consult Note (Signed)
[facility] Vascular Consult Note  MRN : [id number]  Dawn Ford is a 76 y.o. (July 26, 1945) female who presents with chief complaint of  Chief Complaint  Patient presents with   Chest Pain   Hypertension  .  History of Present Illness: I am asked to see the patient by Dr. Posey Pronto for evaluation for renal artery stenosis.  She is a patient known to my service having had an iliac artery aneurysm repair about 8 or 9 months ago.  At that time, she had significant renal atherosclerosis seen on her CT scan.  She is admitted with a malignant hypertension which is now under better control with aggressive medical therapy.  She was also found to have acute kidney injury with a creatinine of greater than 2 with a baseline in the normal range.  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  Current Facility-Administered Medications  Medication Dose Route Frequency Provider Last Rate Last Admin   0.9 %  sodium chloride infusion   Intravenous Continuous Fritzi Mandes, MD       acetaminophen (TYLENOL) tablet 650 mg  650 mg Oral Q6H PRN Ivor Costa, MD       amLODipine (NORVASC) tablet 10 mg  10 mg Oral Daily Ivor Costa, MD   10 mg at 10/13/21 1027   aspirin EC tablet 81 mg  81 mg Oral Daily Ivor Costa, MD   81 mg at 10/13/21 1027   atorvastatin (LIPITOR) tablet 40 mg  40 mg Oral QHS Ivor Costa, MD   40 mg at 10/12/21 2111   famotidine (PEPCID) tablet 40 mg  40 mg Oral QHS Ivor Costa, MD   40 mg at 10/12/21 2112   heparin injection 5,000 Units  5,000 Units Subcutaneous Cleophas Dunker, MD   5,000 Units at 10/13/21 0510   hydrALAZINE (APRESOLINE) injection 10 mg  10 mg Intravenous Q2H PRN Ivor Costa, MD   10 mg at 10/13/21 1607   hydrALAZINE (APRESOLINE) tablet 50 mg  50 mg Oral TID Fritzi Mandes, MD        insulin aspart (novoLOG) injection 0-5 Units  0-5 Units Subcutaneous QHS Ivor Costa, MD       insulin aspart (novoLOG) injection 0-9 Units  0-9 Units Subcutaneous TID WC Ivor Costa, MD   3 Units at 10/13/21 1200   loratadine (CLARITIN) tablet 10 mg  10 mg Oral Daily PRN Ivor Costa, MD       metoprolol tartrate (LOPRESSOR) tablet 75 mg  75 mg Oral BID Fritzi Mandes, MD   75 mg at 10/13/21 1027   ondansetron (ZOFRAN) injection 4 mg  4 mg Intravenous Q8H PRN Ivor Costa, MD        Past Medical History:  Diagnosis Date   Aneurysm of right common iliac artery ([place]) 01/19/2021   a.) US aorta 01/19/2021: measure 2.8 cm. b.) CTA abdomen/pelvis 02/13/2021: measured 2.9 x 2.7 cm.   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[place]) 02/13/2021   a.) CTA abdomen/pelvis: measured 2.6 cm   GERD (gastroesophageal reflux disease)    Hyperlipemia    Hypertension    NSTEMI (non-ST elevated myocardial infarction) (  [place]) 02/17/1999   a.) LHC 02/17/1999 --> 75% LAD and subtotal RCA with thrombus --> PCI performed placing stent (unknown type) to RCA   T2DM (type 2 diabetes mellitus) ([place])     Past Surgical History:  Procedure Laterality Date   CATARACT EXTRACTION, BILATERAL     COLONOSCOPY WITH PROPOFOL N/A 09/01/2018   Procedure: COLONOSCOPY WITH PROPOFOL;  Surgeon: Toledo, Benay Pike, MD;  Location: [facility] END[facility];  Service: Gastroenterology;  Laterality: N/A;   CORONARY ANGIOPLASTY WITH STENT PLACEMENT     ENDOVASCULAR REPAIR/STENT GRAFT N/A 03/01/2021   Procedure: ENDOVASCULAR REPAIR/STENT GRAFT;  Surgeon: Algernon Huxley, MD;  Location: [place] [facility];  Service: Cardiovascular;  Laterality: N/A;   ESOPHAGOGASTRODUODENOSCOPY (EGD) WITH PROPOFOL N/A 03/13/2017   Procedure: ESOPHAGOGASTRODUODENOSCOPY (EGD) WITH PROPOFOL;  Surgeon: Virgel Manifold, MD;  Location: [facility] ENDOSCOPY;  Service: Endoscopy;  Laterality: N/A;   KNEE ARTHROSCOPY       Social History   Tobacco Use   Smoking status: Former    Types: Cigarettes    Quit date: 2000    Years since quitting: 23.7   Smokeless tobacco: Never  Vaping Use   Vaping Use: Never used  Substance Use Topics   Alcohol use: Yes    Comment: rarely   Drug use: No     Family History  Problem Relation Age of Onset   Breast cancer Other   No bleeding disorders, clotting disorders, or aneurysms  No Known Allergies   REVIEW OF SYSTEMS (Negative unless checked)  Constitutional: [] Weight loss  [] Fever  [] Chills Cardiac: [] Chest pain   [] Chest pressure   [] Palpitations   [] Shortness of breath when laying flat   [] Shortness of breath at rest   [] Shortness of breath with exertion. Vascular:  [] Pain in legs with walking   [] Pain in legs at rest   [] Pain in legs when laying flat   [] Claudication   [] Pain in feet when walking  [] Pain in feet at rest  [] Pain in feet when laying flat   [] History of DVT   [] Phlebitis   [] Swelling in legs   [] Varicose veins   [] Non-healing ulcers Pulmonary:   [] Uses home oxygen   [] Productive cough   [] Hemoptysis   [] Wheeze  [] COPD   [] Asthma Neurologic:  [] Dizziness  [] Blackouts   [] Seizures   [] History of stroke   [] History of TIA  [] Aphasia   [] Temporary blindness   [] Dysphagia   [] Weakness or numbness in arms   [] Weakness or numbness in legs Musculoskeletal:  [x] Arthritis   [] Joint swelling   [] Joint pain   [] Low back pain Hematologic:  [] Easy bruising  [] Easy bleeding   [] Hypercoagulable state   [x] Anemic  [] Hepatitis Gastrointestinal:  [] Blood in stool   [] Vomiting blood  [x] Gastroesophageal reflux/heartburn   [] Difficulty swallowing. Genitourinary:  [x] Chronic kidney disease   [] Difficult urination  [] Frequent urination  [] Burning with urination   [] Blood in urine Skin:  [] Rashes   [] Ulcers   [] Wounds Psychological:  [] History of anxiety   []   History of major depression.  Physical Examination  Vitals:   10/13/21 0040 10/13/21 0414 10/13/21 0822 10/13/21 1134  BP: (!) 175/60 (!) 185/57 (!) 177/62 (!) 128/103  Pulse: 78 81 90 73  Resp: 19 18 16 16   Temp: 98.4 F (36.9 C) 98.3 F (36.8 C) 98.4 F (36.9 C) 99.1 F (37.3 C)  TempSrc: Oral Oral    SpO2: 97% 98% 96% 98%  Weight:      Height:  Body mass index is 30.3 kg/m. Gen:  WD/WN, NAD. Appears younger than stated age. Head: [place]/AT, No temporalis wasting.  Ear/Nose/Throat: Hearing grossly intact, nares w/o erythema or drainage, oropharynx w/o Erythema/Exudate Eyes: Sclera non-icteric, conjunctiva clear Neck: Trachea midline.  No JVD.  Pulmonary:  Good air movement, respirations not labored, equal bilaterally.  Cardiac: RRR, no JVD Vascular:  Vessel Right Left  Radial Palpable Palpable                                   Musculoskeletal: M/S 5/5 throughout.  Extremities without ischemic changes.  No deformity or atrophy. No edema. Neurologic: Sensation grossly intact in extremities.  Symmetrical.  Speech is fluent. Motor exam as listed above. Psychiatric: Judgment intact, Mood & affect appropriate for pt's clinical situation. Dermatologic: No rashes or ulcers noted.  No cellulitis or open wounds.      CBC Lab Results  Component Value Date   WBC 7.7 10/11/2021   HGB 11.7 (L) 10/11/2021   HCT 36.6 10/11/2021   MCV 87.8 10/11/2021   PLT 222 10/11/2021    BMET    Component Value Date/Time   NA 138 10/13/2021 0501   K 4.3 10/13/2021 0501   K 3.8 10/07/2012 1128   CL 109 10/13/2021 0501   CO2 22 10/13/2021 0501   GLUCOSE 117 (H) 10/13/2021 0501   BUN 40 (H) 10/13/2021 0501   CREATININE 2.47 (H) 10/13/2021 0501   CALCIUM 8.7 (L) 10/13/2021 0501   GFRNONAA 19 (L) 10/13/2021 0501   GFRAA >60 12/13/2016 0834   Estimated Creatinine Clearance: 20.6 mL/min (A) (by C-G formula based on SCr of 2.47 mg/dL (H)).  COAG Lab Results  Component Value  Date   INR 1.1 10/12/2021    Radiology US RENAL ARTERY DUPLEX COMPLETE  Result Date: 10/13/2021 CLINICAL DATA:  200546 HTN (hypertension), malignant [id number] EXAM: RENAL/URINARY TRACT ULTRASOUND RENAL DUPLEX DOPPLER ULTRASOUND COMPARISON:  None Available. FINDINGS: Right Kidney: Length: 8.8. Increased cortical echogenicity. No mass or hydronephrosis. Left Kidney: Length: 9.8 cm. Increased cortical echogenicity. No mass or hydronephrosis. Bladder:  Within normal limits, partially decompressed. RENAL DUPLEX ULTRASOUND Right Renal Artery Velocities: Origin:  172 cm/sec Mid:  150 cm/sec [place]:  41 cm/sec Interlobar:  54 cm/sec Arcuate:  55 cm/sec; normal resistive indices 0.5 Left Renal Artery Velocities: Origin:  173 cm/sec Mid:  156 cm/sec [place]um:  72 cm/sec Interlobar:  85 cm/sec Arcuate:  58 cm/sec; normal resistive indices 0.7 Aortic Velocity:  97 cm/sec Right Renal-Aortic Ratios: Origin: 1.8 Mid:  1.5 [place]: 0.4 Interlobar: 0.6 Arcuate: 0.6 Left Renal-Aortic Ratios: Origin: 1.8 Mid: 1.6 [place]: 0.7 Interlobar: 0.9 Arcuate: 0.6 IMPRESSION: 1. No findings of renal artery stenosis by Doppler criteria. 2. Increased cortical echogenicity of the kidneys, suggestive of underlying medical renal disease. Electronically Signed   By: Albin Felling M.D.   On: 10/13/2021 14:08   DG Chest 2 View  Result Date: 10/11/2021 CLINICAL DATA:  Chest pain. EXAM: CHEST - 2 VIEW COMPARISON:  December 13, 2016. FINDINGS: The heart size and mediastinal contours are within normal limits. Both lungs are clear. The visualized skeletal structures are unremarkable. IMPRESSION: No active cardiopulmonary disease. Aortic Atherosclerosis (ICD10-I70.0). Electronically Signed   By: Marijo Conception M.D.   On: 10/11/2021 12:55      Assessment/Plan 1.  Renal artery stenosis with poorly controlled hypertension acute kidney injury.  Have discussed with the patient we suspect a significant  renal artery stenosis but or not entirely sure that is  the case.  Renal angiography with possible intervention is indicated for further evaluation and potential treatment.  Of also discussed that I would only treat 1 renal artery time even if we discover disease in both renal arteries.  I generally staged these 4 to 6 weeks.  This could be a diagnostic study if she does not have hemodynamically significant renal artery stenosis, but the degree and amount of contrast that would be used with the study would still be far less than a CT angiogram and renal duplex is very inaccurate in our institution.  I will plan for renal artery angiography and possible intervention on Monday.  Risks and benefits were discussed and she is agreeable to proceed 2.  Iliac artery aneurysm.  Status postrepair earlier this year.  No issues with this currently. 3.  Hypertension.  Admitted with poorly controlled hypertension and likely renovascular sources present.  Treatment as above. 4.  Diabetes. Stable on outpatient medications and blood glucose control important in reducing the progression of atherosclerotic disease. Also, involved in wound healing. On appropriate medications.    [name], MD  10/13/2021 3:17 PM    This note was created with Dragon medical transcription system.  Any error is purely unintentional

## 2021-10-13 NOTE — Progress Notes (Signed)
[facility] NAME: Larrissa Stivers    MR#:  [id number]  DATE OF BIRTH:  [birth year]  SUBJECTIVE:   Came in after noticing increased blood pressure on several occasions at home recently. Does not eat salt in her diet. Denies any chest pain headache or shortness of breath. Blood pressure improved after PO and IV meds. No family at bedside   VITALS:  Blood pressure (!) 128/103, pulse 73, temperature 99.1 F (37.3 C), resp. rate 16, height 5\' 6"  (1.676 m), weight 85.1 kg, SpO2 98 %.  PHYSICAL EXAMINATION:   GENERAL:  76 y.o.-year-old patient lying in the bed with no acute distress.  LUNGS: Normal breath sounds bilaterally, no wheezing CARDIOVASCULAR: S1, S2 normal. No murmurs,   ABDOMEN: Soft, nontender, nondistended. Bowel sounds present.  EXTREMITIES: No  edema b/l.    NEUROLOGIC: nonfocal  patient is alert and awake SKIN: No obvious rash, lesion, or ulcer.   LABORATORY PANEL:  CBC Recent Labs  Lab 10/11/21 1218  WBC 7.7  HGB 11.7*  HCT 36.6  PLT 222     Chemistries  Recent Labs  Lab 10/13/21 0501  NA 138  K 4.3  CL 109  CO2 22  GLUCOSE 117*  BUN 40*  CREATININE 2.47*  CALCIUM 8.7*    Cardiac Enzymes No results for input(s): "TROPONINI" in the last 168 hours. RADIOLOGY:  US RENAL ARTERY DUPLEX COMPLETE  Result Date: 10/13/2021 CLINICAL DATA:  200546 HTN (hypertension), malignant [id number] EXAM: RENAL/URINARY TRACT ULTRASOUND RENAL DUPLEX DOPPLER ULTRASOUND COMPARISON:  None Available. FINDINGS: Right Kidney: Length: 8.8. Increased cortical echogenicity. No mass or hydronephrosis. Left Kidney: Length: 9.8 cm. Increased cortical echogenicity. No mass or hydronephrosis. Bladder:  Within normal limits, partially decompressed. RENAL DUPLEX ULTRASOUND Right Renal Artery Velocities: Origin:  172 cm/sec Mid:  150 cm/sec Hilum:  41 cm/sec Interlobar:  54 cm/sec Arcuate:  55 cm/sec; normal resistive indices 0.5 Left Renal Artery  Velocities: Origin:  173 cm/sec Mid:  156 cm/sec Hilum:  72 cm/sec Interlobar:  85 cm/sec Arcuate:  58 cm/sec; normal resistive indices 0.7 Aortic Velocity:  97 cm/sec Right Renal-Aortic Ratios: Origin: 1.8 Mid:  1.5 Hilum: 0.4 Interlobar: 0.6 Arcuate: 0.6 Left Renal-Aortic Ratios: Origin: 1.8 Mid: 1.6 Hilum: 0.7 Interlobar: 0.9 Arcuate: 0.6 IMPRESSION: 1. No findings of renal artery stenosis by Doppler criteria. 2. Increased cortical echogenicity of the kidneys, suggestive of underlying medical renal disease. Electronically Signed   By: Albin Felling M.D.   On: 10/13/2021 14:08    Assessment and Plan  NOELIE RENFROW is a 76 y.o. 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    Patient states that her blood pressure has not been controlled well recently.  She had elevated blood pressure up to SBP 200 yesterday.  Her cardiologist added amlodipine 5 mg to current regimen (including ramipril, HCTZ and metoprolol).    *Hypertensive urgency Initially blood pressure 203/68, which improved to 188/64 after giving 10 mg of IV labetalol.  Heart rate 58, will not give more IV beta-blocker.  -Increase home amlodipine dose from 5 to 10 mg daily -Continue home metoprolol 50 mg twice daily--increase to 75 mg bid -Hold ramipril and HCTZ due to worsening renal function -Start oral hydralazine 50 mg 3 times daily -IV hydralazine as needed   Coronary artery disease Chest pain and history of  CAD: Her chest pain has resolved.  Troponin negative x2.  Possibly due to demand ischemia secondary to hypertensive urgency. -Blood pressure control as above -As needed nitroglycerin and morphine -Continue aspirin, Lipitor, Toprol   Chest pain - See above   Acute renal failure superimposed on stage 3a chronic kidney disease ([place]) suspected renal artery stenosis on recent CT  abdomen Possibly due to dehydration and continuation for ramipril and HCTZ.  Hypertensive urgency may have contributed partially. -Hold ramipril and HCTZ -IV normal saline 75 cc/h --baseline creat 1.02 (feb 2023) --came in with creat 2.6--2.3--2.47 -- nephrology consultation with Dr. Candiss Norse -- Renal duplex ultrasound-- within normal limits -- discussed with vascular surgery Dr. Lucky Cowboy to see patient-- recommends continue IV fluids will consider angiogram on Monday   Bilateral carotid artery stenosis - Continue aspirin and Lipitor   Hyperlipemia - Lipitor   Type II diabetes mellitus with renal manifestations ([place]) Recent A1c 6.0, well controlled.  Patient is still taking Victoza, Actos and Amaryl -Sliding scale insulin   Ectatic abdominal aorta (HCC) - Follow-up with PCP   Iliac artery aneurysm ([place]) -f/u with PCP    Procedures:none Family communication :none Consults :none CODE STATUS: full DVT Prophylaxis :heparin Level of care: Telemetry Cardiac Status is: Observation The patient remains OBS appropriate and will d/c before 2 midnights.    TOTAL TIME TAKING CARE OF THIS PATIENT: 35 minutes.  >50% time spent on counselling and coordination of care  Note: This dictation was prepared with Dragon dictation along with smaller phrase technology. Any transcriptional errors that result from this process are unintentional.  Fritzi Mandes M.D    [facility]   CC: Primary care physician; Idelle Crouch, MD

## 2021-10-13 NOTE — H&P (View-Only) (Signed)
[facility] Vascular Consult Note  MRN : [id number]  Dawn Ford is a 76 y.o. (Jun 23, 1945) female who presents with chief complaint of  Chief Complaint  Patient presents with   Chest Pain   Hypertension  .  History of Present Illness: I am asked to see the patient by Dr. Posey Pronto for evaluation for renal artery stenosis.  She is a patient known to my service having had an iliac artery aneurysm repair about 8 or 9 months ago.  At that time, she had significant renal atherosclerosis seen on her CT scan.  She is admitted with a malignant hypertension which is now under better control with aggressive medical therapy.  She was also found to have acute kidney injury with a creatinine of greater than 2 with a baseline in the normal range.  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  Current Facility-Administered Medications  Medication Dose Route Frequency Provider Last Rate Last Admin   0.9 %  sodium chloride infusion   Intravenous Continuous Fritzi Mandes, MD       acetaminophen (TYLENOL) tablet 650 mg  650 mg Oral Q6H PRN Ivor Costa, MD       amLODipine (NORVASC) tablet 10 mg  10 mg Oral Daily Ivor Costa, MD   10 mg at 10/13/21 1027   aspirin EC tablet 81 mg  81 mg Oral Daily Ivor Costa, MD   81 mg at 10/13/21 1027   atorvastatin (LIPITOR) tablet 40 mg  40 mg Oral QHS Ivor Costa, MD   40 mg at 10/12/21 2111   famotidine (PEPCID) tablet 40 mg  40 mg Oral QHS Ivor Costa, MD   40 mg at 10/12/21 2112   heparin injection 5,000 Units  5,000 Units Subcutaneous Cleophas Dunker, MD   5,000 Units at 10/13/21 0510   hydrALAZINE (APRESOLINE) injection 10 mg  10 mg Intravenous Q2H PRN Ivor Costa, MD   10 mg at 10/13/21 7829   hydrALAZINE (APRESOLINE) tablet 50 mg  50 mg Oral TID Fritzi Mandes, MD        insulin aspart (novoLOG) injection 0-5 Units  0-5 Units Subcutaneous QHS Ivor Costa, MD       insulin aspart (novoLOG) injection 0-9 Units  0-9 Units Subcutaneous TID WC Ivor Costa, MD   3 Units at 10/13/21 1200   loratadine (CLARITIN) tablet 10 mg  10 mg Oral Daily PRN Ivor Costa, MD       metoprolol tartrate (LOPRESSOR) tablet 75 mg  75 mg Oral BID Fritzi Mandes, MD   75 mg at 10/13/21 1027   ondansetron (ZOFRAN) injection 4 mg  4 mg Intravenous Q8H PRN Ivor Costa, MD        Past Medical History:  Diagnosis Date   Aneurysm of right common iliac artery ([place]) 01/19/2021   a.) US aorta 01/19/2021: measure 2.8 cm. b.) CTA abdomen/pelvis 02/13/2021: measured 2.9 x 2.7 cm.   Anginal pain ([place])    Aortic atherosclerosis (HCC)    Bilateral carotid artery stenosis    Coronary artery disease    DDD (degenerative disc disease), lumbar    Diverticulosis    Ectatic abdominal aorta ([place]) 02/13/2021   a.) CTA abdomen/pelvis: measured 2.6 cm   GERD (gastroesophageal reflux disease)    Hyperlipemia    Hypertension    NSTEMI (non-ST elevated myocardial infarction) (  [place]) 02/17/1999   a.) LHC 02/17/1999 --> 75% LAD and subtotal RCA with thrombus --> PCI performed placing stent (unknown type) to RCA   T2DM (type 2 diabetes mellitus) ([place])     Past Surgical History:  Procedure Laterality Date   CATARACT EXTRACTION, BILATERAL     COLONOSCOPY WITH PROPOFOL N/A 09/01/2018   Procedure: COLONOSCOPY WITH PROPOFOL;  Surgeon: Toledo, Benay Pike, MD;  Location: [facility] ENDOSCOPY;  Service: Gastroenterology;  Laterality: N/A;   CORONARY ANGIOPLASTY WITH STENT PLACEMENT     ENDOVASCULAR REPAIR/STENT GRAFT N/A 03/01/2021   Procedure: ENDOVASCULAR REPAIR/STENT GRAFT;  Surgeon: Algernon Huxley, MD;  Location: [facility];  Service: Cardiovascular;  Laterality: N/A;   ESOPHAGOGASTRODUODENOSCOPY (EGD) WITH PROPOFOL N/A 03/13/2017   Procedure: ESOPHAGOGASTRODUODENOSCOPY (EGD) WITH PROPOFOL;  Surgeon: Virgel Manifold, MD;  Location: [facility] ENDOSCOPY;  Service: Endoscopy;  Laterality: N/A;   KNEE ARTHROSCOPY       Social History   Tobacco Use   Smoking status: Former    Types: Cigarettes    Quit date: 2000    Years since quitting: 23.7   Smokeless tobacco: Never  Vaping Use   Vaping Use: Never used  Substance Use Topics   Alcohol use: Yes    Comment: rarely   Drug use: No     Family History  Problem Relation Age of Onset   Breast cancer Other   No bleeding disorders, clotting disorders, or aneurysms  No Known Allergies   REVIEW OF SYSTEMS (Negative unless checked)  Constitutional: [] Weight loss  [] Fever  [] Chills Cardiac: [] Chest pain   [] Chest pressure   [] Palpitations   [] Shortness of breath when laying flat   [] Shortness of breath at rest   [] Shortness of breath with exertion. Vascular:  [] Pain in legs with walking   [] Pain in legs at rest   [] Pain in legs when laying flat   [] Claudication   [] Pain in feet when walking  [] Pain in feet at rest  [] Pain in feet when laying flat   [] History of DVT   [] Phlebitis   [] Swelling in legs   [] Varicose veins   [] Non-healing ulcers Pulmonary:   [] Uses home oxygen   [] Productive cough   [] Hemoptysis   [] Wheeze  [] COPD   [] Asthma Neurologic:  [] Dizziness  [] Blackouts   [] Seizures   [] History of stroke   [] History of TIA  [] Aphasia   [] Temporary blindness   [] Dysphagia   [] Weakness or numbness in arms   [] Weakness or numbness in legs Musculoskeletal:  [x] Arthritis   [] Joint swelling   [] Joint pain   [] Low back pain Hematologic:  [] Easy bruising  [] Easy bleeding   [] Hypercoagulable state   [x] Anemic  [] Hepatitis Gastrointestinal:  [] Blood in stool   [] Vomiting blood  [x] Gastroesophageal reflux/heartburn   [] Difficulty swallowing. Genitourinary:  [x] Chronic kidney disease   [] Difficult urination  [] Frequent urination  [] Burning with urination   [] Blood in urine Skin:  [] Rashes   [] Ulcers   [] Wounds Psychological:  [] History of anxiety   []   History of major depression.  Physical Examination  Vitals:   10/13/21 0040 10/13/21 0414 10/13/21 0822 10/13/21 1134  BP: (!) 175/60 (!) 185/57 (!) 177/62 (!) 128/103  Pulse: 78 81 90 73  Resp: 19 18 16 16   Temp: 98.4 F (36.9 C) 98.3 F (36.8 C) 98.4 F (36.9 C) 99.1 F (37.3 C)  TempSrc: Oral Oral    SpO2: 97% 98% 96% 98%  Weight:      Height:  Body mass index is 30.3 kg/m. Gen:  WD/WN, NAD. Appears younger than stated age. Head: [place]/AT, No temporalis wasting.  Ear/Nose/Throat: Hearing grossly intact, nares w/o erythema or drainage, oropharynx w/o Erythema/Exudate Eyes: Sclera non-icteric, conjunctiva clear Neck: Trachea midline.  No JVD.  Pulmonary:  Good air movement, respirations not labored, equal bilaterally.  Cardiac: RRR, no JVD Vascular:  Vessel Right Left  Radial Palpable Palpable                                   Musculoskeletal: M/S 5/5 throughout.  Extremities without ischemic changes.  No deformity or atrophy. No edema. Neurologic: Sensation grossly intact in extremities.  Symmetrical.  Speech is fluent. Motor exam as listed above. Psychiatric: Judgment intact, Mood & affect appropriate for pt's clinical situation. Dermatologic: No rashes or ulcers noted.  No cellulitis or open wounds.      CBC Lab Results  Component Value Date   WBC 7.7 10/11/2021   HGB 11.7 (L) 10/11/2021   HCT 36.6 10/11/2021   MCV 87.8 10/11/2021   PLT 222 10/11/2021    BMET    Component Value Date/Time   NA 138 10/13/2021 0501   K 4.3 10/13/2021 0501   K 3.8 10/07/2012 1128   CL 109 10/13/2021 0501   CO2 22 10/13/2021 0501   GLUCOSE 117 (H) 10/13/2021 0501   BUN 40 (H) 10/13/2021 0501   CREATININE 2.47 (H) 10/13/2021 0501   CALCIUM 8.7 (L) 10/13/2021 0501   GFRNONAA 19 (L) 10/13/2021 0501   GFRAA >60 12/13/2016 0834   Estimated Creatinine Clearance: 20.6 mL/min (A) (by C-G formula based on SCr of 2.47 mg/dL (H)).  COAG Lab Results  Component Value  Date   INR 1.1 10/12/2021    Radiology US RENAL ARTERY DUPLEX COMPLETE  Result Date: 10/13/2021 CLINICAL DATA:  200546 HTN (hypertension), malignant [id number] EXAM: RENAL/URINARY TRACT ULTRASOUND RENAL DUPLEX DOPPLER ULTRASOUND COMPARISON:  None Available. FINDINGS: Right Kidney: Length: 8.8. Increased cortical echogenicity. No mass or hydronephrosis. Left Kidney: Length: 9.8 cm. Increased cortical echogenicity. No mass or hydronephrosis. Bladder:  Within normal limits, partially decompressed. RENAL DUPLEX ULTRASOUND Right Renal Artery Velocities: Origin:  172 cm/sec Mid:  150 cm/sec Hilum:  41 cm/sec Interlobar:  54 cm/sec Arcuate:  55 cm/sec; normal resistive indices 0.5 Left Renal Artery Velocities: Origin:  173 cm/sec Mid:  156 cm/sec Hilum:  72 cm/sec Interlobar:  85 cm/sec Arcuate:  58 cm/sec; normal resistive indices 0.7 Aortic Velocity:  97 cm/sec Right Renal-Aortic Ratios: Origin: 1.8 Mid:  1.5 Hilum: 0.4 Interlobar: 0.6 Arcuate: 0.6 Left Renal-Aortic Ratios: Origin: 1.8 Mid: 1.6 Hilum: 0.7 Interlobar: 0.9 Arcuate: 0.6 IMPRESSION: 1. No findings of renal artery stenosis by Doppler criteria. 2. Increased cortical echogenicity of the kidneys, suggestive of underlying medical renal disease. Electronically Signed   By: Albin Felling M.D.   On: 10/13/2021 14:08   DG Chest 2 View  Result Date: 10/11/2021 CLINICAL DATA:  Chest pain. EXAM: CHEST - 2 VIEW COMPARISON:  December 13, 2016. FINDINGS: The heart size and mediastinal contours are within normal limits. Both lungs are clear. The visualized skeletal structures are unremarkable. IMPRESSION: No active cardiopulmonary disease. Aortic Atherosclerosis (ICD10-I70.0). Electronically Signed   By: Marijo Conception M.D.   On: 10/11/2021 12:55      Assessment/Plan 1.  Renal artery stenosis with poorly controlled hypertension acute kidney injury.  Have discussed with the patient we suspect a significant  renal artery stenosis but or not entirely sure that is  the case.  Renal angiography with possible intervention is indicated for further evaluation and potential treatment.  Of also discussed that I would only treat 1 renal artery time even if we discover disease in both renal arteries.  I generally staged these 4 to 6 weeks.  This could be a diagnostic study if she does not have hemodynamically significant renal artery stenosis, but the degree and amount of contrast that would be used with the study would still be far less than a CT angiogram and renal duplex is very inaccurate in our institution.  I will plan for renal artery angiography and possible intervention on Monday.  Risks and benefits were discussed and she is agreeable to proceed 2.  Iliac artery aneurysm.  Status postrepair earlier this year.  No issues with this currently. 3.  Hypertension.  Admitted with poorly controlled hypertension and likely renovascular sources present.  Treatment as above. 4.  Diabetes. Stable on outpatient medications and blood glucose control important in reducing the progression of atherosclerotic disease. Also, involved in wound healing. On appropriate medications.    [name], MD  10/13/2021 3:17 PM    This note was created with Dragon medical transcription system.  Any error is purely unintentional

## 2021-10-14 DIAGNOSIS — E8770 Fluid overload, unspecified: Secondary | ICD-10-CM | POA: Diagnosis not present

## 2021-10-14 DIAGNOSIS — E785 Hyperlipidemia, unspecified: Secondary | ICD-10-CM | POA: Diagnosis present

## 2021-10-14 DIAGNOSIS — I701 Atherosclerosis of renal artery: Secondary | ICD-10-CM | POA: Diagnosis present

## 2021-10-14 DIAGNOSIS — I9589 Other hypotension: Secondary | ICD-10-CM | POA: Diagnosis not present

## 2021-10-14 DIAGNOSIS — N179 Acute kidney failure, unspecified: Secondary | ICD-10-CM | POA: Diagnosis present

## 2021-10-14 DIAGNOSIS — E871 Hypo-osmolality and hyponatremia: Secondary | ICD-10-CM | POA: Diagnosis present

## 2021-10-14 DIAGNOSIS — I700 Atherosclerosis of aorta: Secondary | ICD-10-CM | POA: Diagnosis present

## 2021-10-14 DIAGNOSIS — I129 Hypertensive chronic kidney disease with stage 1 through stage 4 chronic kidney disease, or unspecified chronic kidney disease: Secondary | ICD-10-CM | POA: Diagnosis present

## 2021-10-14 DIAGNOSIS — E872 Acidosis, unspecified: Secondary | ICD-10-CM | POA: Diagnosis present

## 2021-10-14 DIAGNOSIS — D631 Anemia in chronic kidney disease: Secondary | ICD-10-CM | POA: Diagnosis present

## 2021-10-14 DIAGNOSIS — N1831 Chronic kidney disease, stage 3a: Secondary | ICD-10-CM | POA: Diagnosis present

## 2021-10-14 DIAGNOSIS — I77811 Abdominal aortic ectasia: Secondary | ICD-10-CM | POA: Diagnosis present

## 2021-10-14 DIAGNOSIS — E875 Hyperkalemia: Secondary | ICD-10-CM | POA: Diagnosis present

## 2021-10-14 DIAGNOSIS — I169 Hypertensive crisis, unspecified: Secondary | ICD-10-CM | POA: Diagnosis present

## 2021-10-14 DIAGNOSIS — E860 Dehydration: Secondary | ICD-10-CM | POA: Diagnosis not present

## 2021-10-14 DIAGNOSIS — E1122 Type 2 diabetes mellitus with diabetic chronic kidney disease: Secondary | ICD-10-CM | POA: Diagnosis present

## 2021-10-14 DIAGNOSIS — N186 End stage renal disease: Secondary | ICD-10-CM | POA: Diagnosis not present

## 2021-10-14 DIAGNOSIS — I252 Old myocardial infarction: Secondary | ICD-10-CM | POA: Diagnosis not present

## 2021-10-14 DIAGNOSIS — I6523 Occlusion and stenosis of bilateral carotid arteries: Secondary | ICD-10-CM | POA: Diagnosis present

## 2021-10-14 DIAGNOSIS — R0603 Acute respiratory distress: Secondary | ICD-10-CM | POA: Diagnosis not present

## 2021-10-14 DIAGNOSIS — I150 Renovascular hypertension: Secondary | ICD-10-CM | POA: Diagnosis not present

## 2021-10-14 DIAGNOSIS — N280 Ischemia and infarction of kidney: Secondary | ICD-10-CM | POA: Diagnosis not present

## 2021-10-14 DIAGNOSIS — I160 Hypertensive urgency: Secondary | ICD-10-CM | POA: Diagnosis present

## 2021-10-14 DIAGNOSIS — I723 Aneurysm of iliac artery: Secondary | ICD-10-CM | POA: Diagnosis present

## 2021-10-14 DIAGNOSIS — Z992 Dependence on renal dialysis: Secondary | ICD-10-CM | POA: Diagnosis not present

## 2021-10-14 DIAGNOSIS — I1A0 Resistant hypertension: Secondary | ICD-10-CM | POA: Diagnosis present

## 2021-10-14 DIAGNOSIS — K219 Gastro-esophageal reflux disease without esophagitis: Secondary | ICD-10-CM | POA: Diagnosis present

## 2021-10-14 LAB — GLUCOSE, CAPILLARY
Glucose-Capillary: 104 mg/dL — ABNORMAL HIGH (ref 70–99)
Glucose-Capillary: 142 mg/dL — ABNORMAL HIGH (ref 70–99)
Glucose-Capillary: 181 mg/dL — ABNORMAL HIGH (ref 70–99)
Glucose-Capillary: 190 mg/dL — ABNORMAL HIGH (ref 70–99)
Glucose-Capillary: 278 mg/dL — ABNORMAL HIGH (ref 70–99)
Glucose-Capillary: 92 mg/dL (ref 70–99)

## 2021-10-14 LAB — BASIC METABOLIC PANEL
Anion gap: 4 — ABNORMAL LOW (ref 5–15)
BUN: 37 mg/dL — ABNORMAL HIGH (ref 8–23)
CO2: 22 mmol/L (ref 22–32)
Calcium: 8.8 mg/dL — ABNORMAL LOW (ref 8.9–10.3)
Chloride: 111 mmol/L (ref 98–111)
Creatinine, Ser: 2.39 mg/dL — ABNORMAL HIGH (ref 0.44–1.00)
GFR, Estimated: 20 mL/min — ABNORMAL LOW (ref >60)
Glucose, Bld: 148 mg/dL — ABNORMAL HIGH (ref 70–99)
Potassium: 4.2 mmol/L (ref 3.5–5.1)
Sodium: 137 mmol/L (ref 135–145)

## 2021-10-14 MED ORDER — CEFAZOLIN SODIUM-DEXTROSE 2-4 GM/100ML-% IV SOLN
2.0000 g | INTRAVENOUS | Status: DC
  Filled 2021-10-14: qty 100

## 2021-10-14 MED ORDER — SODIUM CHLORIDE 0.9 % IV SOLN: INTRAVENOUS | Status: DC

## 2021-10-14 MED ORDER — HYDRALAZINE HCL 50 MG PO TABS
100.0000 mg | ORAL_TABLET | Freq: Three times a day (TID) | ORAL | Status: DC
  Administered 2021-10-14 – 2021-10-30 (×44): 100 mg via ORAL
  Filled 2021-10-14 (×45): qty 2

## 2021-10-14 NOTE — Progress Notes (Signed)
[facility]  ROUNDING NOTE   Subjective:   Patient sitting up in bed, alert and oriented No family at bedside Tolerating meals without nausea and vomiting Denies pain   Creatinine 2.39  Objective:  Vital signs in last 24 hours:  Temp:  [98.3 F (36.8 C)-99.6 F (37.6 C)] 98.3 F (36.8 C) (10/07 0741) Pulse Rate:  [69-91] 72 (10/07 1100) Resp:  [11-20] 17 (10/07 1100) BP: (142-183)/(49-85) 171/49 (10/07 1100) SpO2:  [97 %-99 %] 97 % (10/07 0741)  Weight change:  Filed Weights   10/11/21 1209 10/12/21 1355  Weight: 84.8 kg 85.1 kg    Intake/Output: I/O last 3 completed shifts: In: 1908.7 [P.O.:1010; I.V.:898.7] Out: -    Intake/Output this shift:  No intake/output data recorded.  Physical Exam: General: NAD  Head: Normocephalic, atraumatic. Moist oral mucosal membranes  Eyes: Anicteric  Lungs:  Clear to auscultation, normal effort  Heart: Regular rate and rhythm  Abdomen:  Soft, nontender  Extremities:  No peripheral edema.  Neurologic: Nonfocal, moving all four extremities  Skin: No lesions  Access: None    Basic Metabolic Panel: Recent Labs  Lab 10/11/21 1218 10/12/21 0549 10/13/21 0501 10/14/21 0900  NA 136 140 138 137  K 4.8 4.2 4.3 4.2  CL 107 109 109 111  CO2 21* 24 22 22   GLUCOSE 218* 85 117* 148*  BUN 37* 40* 40* 37*  CREATININE 2.62* 2.34* 2.47* 2.39*  CALCIUM 9.0 8.5* 8.7* 8.8*    Liver Function Tests: No results for input(s): "AST", "ALT", "ALKPHOS", "BILITOT", "PROT", "ALBUMIN" in the last 168 hours. No results for input(s): "LIPASE", "AMYLASE" in the last 168 hours. No results for input(s): "AMMONIA" in the last 168 hours.  CBC: Recent Labs  Lab 10/11/21 1218  WBC 7.7  HGB 11.7*  HCT 36.6  MCV 87.8  PLT 222    Cardiac Enzymes: No results for input(s): "CKTOTAL", "CKMB", "CKMBINDEX", "TROPONINI" in the last 168 hours.  BNP: Invalid input(s): "POCBNP"  CBG: Recent Labs  Lab 10/13/21 1651 10/13/21 2009  10/14/21 0049 10/14/21 0517 10/14/21 0745  GLUCAP 219* 203* 92 104* 142*    Microbiology: Results for orders placed or performed during the hospital encounter of 03/01/21  MRSA Next Gen by PCR, Nasal     Status: None   Collection Time: 03/01/21  4:14 PM   Specimen: Nasal Mucosa; Nasal Swab  Result Value Ref Range Status   MRSA by PCR Next Gen NOT DETECTED NOT DETECTED Final    Comment: (NOTE) The GeneXpert MRSA Assay (FDA approved for NASAL specimens only), is one component of a comprehensive MRSA colonization surveillance program. It is not intended to diagnose MRSA infection nor to guide or monitor treatment for MRSA infections. Test performance is not FDA approved in patients less than 98 years old. Performed at [facility], [address] [place], [place] [address]     Coagulation Studies: Recent Labs    10/12/21 1408  LABPROT 13.9  INR 1.1    Urinalysis: No results for input(s): "COLORURINE", "LABSPEC", "PHURINE", "GLUCOSEU", "HGBUR", "BILIRUBINUR", "KETONESUR", "PROTEINUR", "UROBILINOGEN", "NITRITE", "LEUKOCYTESUR" in the last 72 hours.  Invalid input(s): "APPERANCEUR"    Imaging: US RENAL ARTERY DUPLEX COMPLETE  Result Date: 10/13/2021 CLINICAL DATA:  200546 HTN (hypertension), malignant [id number] EXAM: RENAL/URINARY TRACT ULTRASOUND RENAL DUPLEX DOPPLER ULTRASOUND COMPARISON:  None Available. FINDINGS: Right Kidney: Length: 8.8. Increased cortical echogenicity. No mass or hydronephrosis. Left Kidney: Length: 9.8 cm. Increased cortical echogenicity. No mass or hydronephrosis. Bladder:  Within  normal limits, partially decompressed. RENAL DUPLEX ULTRASOUND Right Renal Artery Velocities: Origin:  172 cm/sec Mid:  150 cm/sec Hilum:  41 cm/sec Interlobar:  54 cm/sec Arcuate:  55 cm/sec; normal resistive indices 0.5 Left Renal Artery Velocities: Origin:  173 cm/sec Mid:  156 cm/sec Hilum:  72 cm/sec Interlobar:  85 cm/sec Arcuate:  58 cm/sec; normal resistive  indices 0.7 Aortic Velocity:  97 cm/sec Right Renal-Aortic Ratios: Origin: 1.8 Mid:  1.5 Hilum: 0.4 Interlobar: 0.6 Arcuate: 0.6 Left Renal-Aortic Ratios: Origin: 1.8 Mid: 1.6 Hilum: 0.7 Interlobar: 0.9 Arcuate: 0.6 IMPRESSION: 1. No findings of renal artery stenosis by Doppler criteria. 2. Increased cortical echogenicity of the kidneys, suggestive of underlying medical renal disease. Electronically Signed   By: Albin Felling M.D.   On: 10/13/2021 14:08     Medications:    sodium chloride 75 mL/hr at 10/14/21 0242    amLODipine  10 mg Oral Daily   aspirin EC  81 mg Oral Daily   atorvastatin  40 mg Oral QHS   famotidine  20 mg Oral QHS   heparin  5,000 Units Subcutaneous Q8H   hydrALAZINE  100 mg Oral TID   insulin aspart  0-5 Units Subcutaneous QHS   insulin aspart  0-9 Units Subcutaneous TID WC   metoprolol tartrate  75 mg Oral BID   acetaminophen, hydrALAZINE, loratadine, ondansetron (ZOFRAN) IV  Assessment/ Plan:  [name] is a 76 y.o.  female  with past medical conditions including diabetes, CAD, hypertension, bilateral carotid artery stenosis, abdominal aorta and iliac artery aneurysm, hyperlipidemia, and chronic kidney disease stage IIIa, who was admitted to [facility] on 10/11/2021 for Hypertensive crisis [I16.9] Hypertensive urgency [I16.0] Hypertension, unspecified type [I10] Acute renal failure ([place]) [N17.9]   Acute kidney injury on chronic kidney disease stage IIIa.  Baseline creatinine appears to be 1.1 with GFR 58 on 06/29/2021.  AKI in the setting of malignant hypertension possibly secondary to renal artery stenosis. CT angio abdomen pelvis from February 13, 2021 shows suspected significant narrowing in both renal arteries along with large amount of calcified atherosclerotic plaque within the abdominal aorta.  Renal artery duplex ultrasound negative for stenosis.   Creatinine stable today. No urine output recorded, but patient states she is voiding. Vascular planning renal  artery angiogram on Monday.   Lab Results  Component Value Date   CREATININE 2.39 (H) 10/14/2021   CREATININE 2.47 (H) 10/13/2021   CREATININE 2.34 (H) 10/12/2021    Intake/Output Summary (Last 24 hours) at 10/14/2021 1147 Last data filed at 10/14/2021 0400 Gross per 24 hour  Intake 1428.69 ml  Output --  Net 1428.69 ml   2. Hypertensive urgency with chronic kidney disease.  Blood pressure on ED arrival 203/68.  Home regimen includes hydrochlorothiazide, metoprolol, ramipril, and recently added amlodipine.  Currently receiving these medications except ramipril.  Blood pressure remains elevated but improving.   3. Anemia of chronic kidney disease  Lab Results  Component Value Date   HGB 11.7 (L) 10/11/2021    Hgb acceptable.    LOS: 0 Lyall Faciane 10/7/202311:47 AM

## 2021-10-14 NOTE — Progress Notes (Signed)
[facility] [place] NAME: Dawn Ford    MR#:  [id number]  DATE OF BIRTH:  [birth year]  SUBJECTIVE:   Came in after noticing increased blood pressure on several occasions at home recently. Does not eat salt in her diet. Denies any chest pain headache or shortness of breath. Blood pressure improved after PO and IV meds.    VITALS:  Blood pressure (!) 154/53, pulse 74, temperature 99 F (37.2 C), resp. rate 16, height 5\' 6"  (1.676 m), weight 85.1 kg, SpO2 98 %.  PHYSICAL EXAMINATION:   GENERAL:  76 y.o.-year-old patient lying in the bed with no acute distress.  LUNGS: Normal breath sounds bilaterally, no wheezing CARDIOVASCULAR: S1, S2 normal. No murmurs,   ABDOMEN: Soft, nontender, nondistended. Bowel sounds present.  EXTREMITIES: No  edema b/l.    NEUROLOGIC: nonfocal  patient is alert and awake SKIN: No obvious rash, lesion, or ulcer.   LABORATORY PANEL:  CBC Recent Labs  Lab 10/11/21 1218  WBC 7.7  HGB 11.7*  HCT 36.6  PLT 222    Chemistries  Recent Labs  Lab 10/14/21 0900  NA 137  K 4.2  CL 111  CO2 22  GLUCOSE 148*  BUN 37*  CREATININE 2.39*  CALCIUM 8.8*   Cardiac Enzymes No results for input(s): "TROPONINI" in the last 168 hours. RADIOLOGY:  US RENAL ARTERY DUPLEX COMPLETE  Result Date: 10/13/2021 CLINICAL DATA:  [id number] HTN (hypertension), malignant [id number] EXAM: RENAL/URINARY TRACT ULTRASOUND RENAL DUPLEX DOPPLER ULTRASOUND COMPARISON:  None Available. FINDINGS: Right Kidney: Length: 8.8. Increased cortical echogenicity. No mass or hydronephrosis. Left Kidney: Length: 9.8 cm. Increased cortical echogenicity. No mass or hydronephrosis. Bladder:  Within normal limits, partially decompressed. RENAL DUPLEX ULTRASOUND Right Renal Artery Velocities: Origin:  172 cm/sec Mid:  150 cm/sec Hilum:  41 cm/sec Interlobar:  54 cm/sec Arcuate:  55 cm/sec; normal resistive indices 0.5 Left Renal Artery Velocities: Origin:  173  cm/sec Mid:  156 cm/sec Hilum:  72 cm/sec Interlobar:  85 cm/sec Arcuate:  58 cm/sec; normal resistive indices 0.7 Aortic Velocity:  97 cm/sec Right Renal-Aortic Ratios: Origin: 1.8 Mid:  1.5 Hilum: 0.4 Interlobar: 0.6 Arcuate: 0.6 Left Renal-Aortic Ratios: Origin: 1.8 Mid: 1.6 Hilum: 0.7 Interlobar: 0.9 Arcuate: 0.6 IMPRESSION: 1. No findings of renal artery stenosis by Doppler criteria. 2. Increased cortical echogenicity of the kidneys, suggestive of underlying medical renal disease. Electronically Signed   By: Albin Felling M.D.   On: 10/13/2021 14:08    Assessment and Plan  Dawn Ford is a 76 y.o. 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    Patient states that her blood pressure has not been controlled well recently.  She had elevated blood pressure up to SBP 200 yesterday.  Her cardiologist added amlodipine 5 mg to current regimen (including ramipril, HCTZ and metoprolol).    Hypertensive urgency Initially blood pressure 203/68, which improved to 188/64 after giving 10 mg of IV labetalol.  Heart rate 58, will not give more IV beta-blocker.  -Increase home amlodipine dose from 5 to 10 mg daily -Continue home metoprolol 50 mg twice daily--increase to 75 mg bid -Hold ramipril and HCTZ due to worsening renal function - oral hydralazine 100 mg 3 times daily -IV hydralazine as needed   Coronary artery disease Chest pain and history of CAD: Her chest pain has  resolved.  Troponin negative x2.  Possibly due to demand ischemia secondary to hypertensive urgency. -Blood pressure control as above -As needed nitroglycerin and morphine -Continue aspirin, Lipitor, Toprol   Chest pain - See above   Acute renal failure superimposed on stage 3a chronic kidney disease ([place]) suspected renal artery stenosis on recent CT abdomen Possibly due to  dehydration and continuation for ramipril and HCTZ.  Hypertensive urgency may have contributed partially. -Hold ramipril and HCTZ -IV normal saline 75 cc/h --baseline creat 1.02 (feb 2023) --came in with creat 2.6--2.3--2.47--2.3 -- nephrology consultation with Dr. Candiss Norse -- Renal duplex ultrasound-- within normal limits -- discussed with vascular surgery Dr. Lucky Cowboy- recommends continue IV fluids will consider angiogram on Monday   Bilateral carotid artery stenosis - Continue aspirin and Lipitor   Hyperlipemia - Lipitor   Type II diabetes mellitus with renal manifestations ([place]) Recent A1c 6.0, well controlled.  Patient is still taking Victoza, Actos and Amaryl -Sliding scale insulin   Ectatic abdominal aorta (HCC) - Follow-up with PCP   Iliac artery aneurysm ([place]) -f/u with PCP    Procedures:none Family communication :none Consults :none CODE STATUS: full DVT Prophylaxis :heparin Level of care: Telemetry Cardiac Status is: inpatient  TOTAL TIME TAKING CARE OF THIS PATIENT: 35 minutes.  >50% time spent on counselling and coordination of care  Note: This dictation was prepared with Dragon dictation along with smaller phrase technology. Any transcriptional errors that result from this process are unintentional.  Fritzi Mandes M.D    [facility]   CC: Primary care physician; Idelle Crouch, MD

## 2021-10-15 DIAGNOSIS — I160 Hypertensive urgency: Secondary | ICD-10-CM | POA: Diagnosis not present

## 2021-10-15 LAB — BASIC METABOLIC PANEL
Anion gap: 8 (ref 5–15)
BUN: 36 mg/dL — ABNORMAL HIGH (ref 8–23)
CO2: 20 mmol/L — ABNORMAL LOW (ref 22–32)
Calcium: 9 mg/dL (ref 8.9–10.3)
Chloride: 109 mmol/L (ref 98–111)
Creatinine, Ser: 2.32 mg/dL — ABNORMAL HIGH (ref 0.44–1.00)
GFR, Estimated: 21 mL/min — ABNORMAL LOW (ref >60)
Glucose, Bld: 279 mg/dL — ABNORMAL HIGH (ref 70–99)
Potassium: 4.4 mmol/L (ref 3.5–5.1)
Sodium: 137 mmol/L (ref 135–145)

## 2021-10-15 LAB — GLUCOSE, CAPILLARY
Glucose-Capillary: 128 mg/dL — ABNORMAL HIGH (ref 70–99)
Glucose-Capillary: 236 mg/dL — ABNORMAL HIGH (ref 70–99)
Glucose-Capillary: 160 mg/dL — ABNORMAL HIGH (ref 70–99)
Glucose-Capillary: 212 mg/dL — ABNORMAL HIGH (ref 70–99)
Glucose-Capillary: 210 mg/dL — ABNORMAL HIGH (ref 70–99)

## 2021-10-15 MED ORDER — CLONIDINE HCL 0.1 MG PO TABS
0.1000 mg | ORAL_TABLET | Freq: Two times a day (BID) | ORAL | Status: DC
  Administered 2021-10-15 – 2021-10-16 (×4): 0.1 mg via ORAL
  Filled 2021-10-15 (×4): qty 1

## 2021-10-15 NOTE — Progress Notes (Signed)
[facility]  ROUNDING NOTE   Subjective:   Patient seen ambulating in the room Looks well today and states she feels better Tolerating meals Denies pain and discomfort  Creatinine 2.32  Objective:  Vital signs in last 24 hours:  Temp:  [98.1 F (36.7 C)-99 F (37.2 C)] 98.7 F (37.1 C) (10/08 0827) Pulse Rate:  [72-90] 90 (10/08 0827) Resp:  [14-18] 18 (10/08 0827) BP: (154-184)/(53-74) 170/56 (10/08 0827) SpO2:  [96 %-100 %] 97 % (10/08 0827)  Weight change:  Filed Weights   10/11/21 1209 10/12/21 1355  Weight: 84.8 kg 85.1 kg    Intake/Output: I/O last 3 completed shifts: In: 1488.7 [P.O.:590; I.V.:898.7] Out: -    Intake/Output this shift:  No intake/output data recorded.  Physical Exam: General: NAD  Head: Normocephalic, atraumatic. Moist oral mucosal membranes  Eyes: Anicteric  Lungs:  Clear to auscultation, normal effort  Heart: Regular rate and rhythm  Abdomen:  Soft, nontender  Extremities:  No peripheral edema.  Neurologic: Nonfocal, moving all four extremities  Skin: No lesions  Access: None    Basic Metabolic Panel: Recent Labs  Lab 10/11/21 1218 10/12/21 0549 10/13/21 0501 10/14/21 0900 10/15/21 1014  NA 136 140 138 137 137  K 4.8 4.2 4.3 4.2 4.4  CL 107 109 109 111 109  CO2 21* 24 22 22  20*  GLUCOSE 218* 85 117* 148* 279*  BUN 37* 40* 40* 37* 36*  CREATININE 2.62* 2.34* 2.47* 2.39* 2.32*  CALCIUM 9.0 8.5* 8.7* 8.8* 9.0     Liver Function Tests: No results for input(s): "AST", "ALT", "ALKPHOS", "BILITOT", "PROT", "ALBUMIN" in the last 168 hours. No results for input(s): "LIPASE", "AMYLASE" in the last 168 hours. No results for input(s): "AMMONIA" in the last 168 hours.  CBC: Recent Labs  Lab 10/11/21 1218  WBC 7.7  HGB 11.7*  HCT 36.6  MCV 87.8  PLT 222     Cardiac Enzymes: No results for input(s): "CKTOTAL", "CKMB", "CKMBINDEX", "TROPONINI" in the last 168 hours.  BNP: Invalid input(s):  "POCBNP"  CBG: Recent Labs  Lab 10/14/21 0745 10/14/21 1210 10/14/21 1617 10/14/21 2051 10/15/21 0827  GLUCAP 142* 278* 181* 190* 128*     Microbiology: Results for orders placed or performed during the hospital encounter of 03/01/21  MRSA Next Gen by PCR, Nasal     Status: None   Collection Time: 03/01/21  4:14 PM   Specimen: Nasal Mucosa; Nasal Swab  Result Value Ref Range Status   MRSA by PCR Next Gen NOT DETECTED NOT DETECTED Final    Comment: (NOTE) The GeneXpert MRSA Assay (FDA approved for NASAL specimens only), is one component of a comprehensive MRSA colonization surveillance program. It is not intended to diagnose MRSA infection nor to guide or monitor treatment for MRSA infections. Test performance is not FDA approved in patients less than 38 years old. Performed at [facility], [address] [place], [place] [address]     Coagulation Studies: Recent Labs    10/12/21 1408  LABPROT 13.9  INR 1.1     Urinalysis: No results for input(s): "COLORURINE", "LABSPEC", "PHURINE", "GLUCOSEU", "HGBUR", "BILIRUBINUR", "KETONESUR", "PROTEINUR", "UROBILINOGEN", "NITRITE", "LEUKOCYTESUR" in the last 72 hours.  Invalid input(s): "APPERANCEUR"    Imaging: No results found.   Medications:    sodium chloride      ceFAZolin (ANCEF) IV      amLODipine  10 mg Oral Daily   aspirin EC  81 mg Oral Daily   atorvastatin  40 mg Oral QHS   cloNIDine  0.1 mg Oral BID   famotidine  20 mg Oral QHS   heparin  5,000 Units Subcutaneous Q8H   hydrALAZINE  100 mg Oral TID   insulin aspart  0-5 Units Subcutaneous QHS   insulin aspart  0-9 Units Subcutaneous TID WC   metoprolol tartrate  75 mg Oral BID   acetaminophen, hydrALAZINE, loratadine, ondansetron (ZOFRAN) IV  Assessment/ Plan:  [name] is a 76 y.o.  female  with past medical conditions including diabetes, CAD, hypertension, bilateral carotid artery stenosis, abdominal aorta and iliac artery  aneurysm, hyperlipidemia, and chronic kidney disease stage IIIa, who was admitted to [facility] on 10/11/2021 for Hypertensive crisis [I16.9] Hypertensive urgency [I16.0] Hypertension, unspecified type [I10] Acute renal failure ([place]) [N17.9]   Acute kidney injury on chronic kidney disease stage IIIa.  Baseline creatinine appears to be 1.1 with GFR 58 on 06/29/2021.  AKI in the setting of malignant hypertension possibly secondary to renal artery stenosis. CT angio abdomen pelvis from February 13, 2021 shows suspected significant narrowing in both renal arteries along with large amount of calcified atherosclerotic plaque within the abdominal aorta.  Renal artery duplex ultrasound negative for stenosis.   Renal function stable. Vascular plans for angiogram tomorrow.   Lab Results  Component Value Date   CREATININE 2.32 (H) 10/15/2021   CREATININE 2.39 (H) 10/14/2021   CREATININE 2.47 (H) 10/13/2021    Intake/Output Summary (Last 24 hours) at 10/15/2021 1205 Last data filed at 10/14/2021 2025 Gross per 24 hour  Intake 180 ml  Output --  Net 180 ml    2. Hypertensive urgency with chronic kidney disease.  Blood pressure on ED arrival 203/68.  Home regimen includes hydrochlorothiazide, metoprolol, ramipril, and recently added amlodipine.  Currently receiving these medications except ramipril.  Blood pressure elevated. Will order Clonidine 0.1mg  twice daily.    3. Anemia of chronic kidney disease  Lab Results  Component Value Date   HGB 11.7 (L) 10/11/2021    Hgb at goal   LOS: 1 [place] 10/8/202312:05 PM

## 2021-10-15 NOTE — Progress Notes (Signed)
   Subjective/Chief Complaint: Denies headache this morning. Remains Hypertensive   Objective: Vital signs in last 24 hours: Temp:  [98.1 F (36.7 C)-99 F (37.2 C)] 98.7 F (37.1 C) (10/08 0827) Pulse Rate:  [72-90] 90 (10/08 0827) Resp:  [14-18] 18 (10/08 0827) BP: (154-184)/(53-74) 170/56 (10/08 0827) SpO2:  [96 %-100 %] 97 % (10/08 0827) Last BM Date : 10/14/21  Intake/Output from previous day: 10/07 0701 - 10/08 0700 In: 180 [P.O.:180] Out: -  Intake/Output this shift: No intake/output data recorded.  Gen: Alert, NAD Cardio: regular rate and rhythm  Lab Results:  No results for input(s): "WBC", "HGB", "HCT", "PLT" in the last 72 hours. BMET Recent Labs    10/13/21 0501 10/14/21 0900  NA 138 137  K 4.3 4.2  CL 109 111  CO2 22 22  GLUCOSE 117* 148*  BUN 40* 37*  CREATININE 2.47* 2.39*  CALCIUM 8.7* 8.8*   PT/INR Recent Labs    10/12/21 1408  LABPROT 13.9  INR 1.1   ABG No results for input(s): "PHART", "HCO3" in the last 72 hours.  Invalid input(s): "PCO2", "PO2"  Studies/Results: No results found.  Anti-infectives: Anti-infectives (From admission, onward)    Start     Dose/Rate Route Frequency Ordered Stop   10/14/21 2055  ceFAZolin (ANCEF) IVPB 2g/100 mL premix        2 g 200 mL/hr over 30 Minutes Intravenous 30 min pre-op 10/14/21 2055         Assessment/Plan: Renal artery Stenosis HTN  Plan for renal angiography with possible intervention tomorrow.  LOS: 1 day    Dawn Ford A 10/15/2021

## 2021-10-15 NOTE — H&P (View-Only) (Signed)
   Subjective/Chief Complaint: Denies headache this morning. Remains Hypertensive   Objective: Vital signs in last 24 hours: Temp:  [98.1 F (36.7 C)-99 F (37.2 C)] 98.7 F (37.1 C) (10/08 0827) Pulse Rate:  [72-90] 90 (10/08 0827) Resp:  [14-18] 18 (10/08 0827) BP: (154-184)/(53-74) 170/56 (10/08 0827) SpO2:  [96 %-100 %] 97 % (10/08 0827) Last BM Date : 10/14/21  Intake/Output from previous day: 10/07 0701 - 10/08 0700 In: 180 [P.O.:180] Out: -  Intake/Output this shift: No intake/output data recorded.  Gen: Alert, NAD Cardio: regular rate and rhythm  Lab Results:  No results for input(s): "WBC", "HGB", "HCT", "PLT" in the last 72 hours. BMET Recent Labs    10/13/21 0501 10/14/21 0900  NA 138 137  K 4.3 4.2  CL 109 111  CO2 22 22  GLUCOSE 117* 148*  BUN 40* 37*  CREATININE 2.47* 2.39*  CALCIUM 8.7* 8.8*   PT/INR Recent Labs    10/12/21 1408  LABPROT 13.9  INR 1.1   ABG No results for input(s): "PHART", "HCO3" in the last 72 hours.  Invalid input(s): "PCO2", "PO2"  Studies/Results: No results found.  Anti-infectives: Anti-infectives (From admission, onward)    Start     Dose/Rate Route Frequency Ordered Stop   10/14/21 2055  ceFAZolin (ANCEF) IVPB 2g/100 mL premix        2 g 200 mL/hr over 30 Minutes Intravenous 30 min pre-op 10/14/21 2055         Assessment/Plan: Renal artery Stenosis HTN  Plan for renal angiography with possible intervention tomorrow.  LOS: 1 day    Dawn Ford A 10/15/2021

## 2021-10-15 NOTE — Progress Notes (Signed)
[facility] [place] NAME: Dawn Ford    MR#:  [id number]  DATE OF BIRTH:  February 20, 1945  SUBJECTIVE:  overall doing well. Good urine output. Blood pressure still remains on the higher side despite adjusting meds  VITALS:  Blood pressure (!) 165/56, pulse 77, temperature 98 F (36.7 C), resp. rate 18, height 5\' 6"  (1.676 m), weight 85.1 kg, SpO2 98 %.  PHYSICAL EXAMINATION:   GENERAL:  76 y.o.-year-old patient lying in the bed with no acute distress.  LUNGS: Normal breath sounds bilaterally, no wheezing CARDIOVASCULAR: S1, S2 normal. No murmurs,   ABDOMEN: Soft, nontender, nondistended. Bowel sounds present.  EXTREMITIES: No  edema b/l.    NEUROLOGIC: nonfocal  patient is alert and awake SKIN: No obvious rash, lesion, or ulcer.   LABORATORY PANEL:  CBC Recent Labs  Lab 10/11/21 1218  WBC 7.7  HGB 11.7*  HCT 36.6  PLT 222     Chemistries  Recent Labs  Lab 10/15/21 1014  NA 137  K 4.4  CL 109  CO2 20*  GLUCOSE 279*  BUN 36*  CREATININE 2.32*  CALCIUM 9.0    Cardiac Enzymes No results for input(s): "TROPONINI" in the last 168 hours. RADIOLOGY:  No results found.  Assessment and Plan  Dawn Ford is a 76 y.o. 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    Patient states that her blood pressure has not been controlled well recently.  She had elevated blood pressure up to SBP 200 yesterday.  Her cardiologist added amlodipine 5 mg to current regimen (including ramipril, HCTZ and metoprolol).    Hypertensive urgency Initially blood pressure 203/68, which improved to 188/64 after giving 10 mg of IV labetalol.  Heart rate 58, will not give more IV beta-blocker.  -Increase home amlodipine dose from 5 to 10 mg daily -Continue home metoprolol 50 mg twice daily--increase to 75  mg bid -Hold ramipril and HCTZ due to worsening renal function - oral hydralazine 100 mg 3 times daily -IV hydralazine as needed   Coronary artery disease Chest pain and history of CAD: Her chest pain has resolved.  Troponin negative x2.  Possibly due to demand ischemia secondary to hypertensive urgency. -Blood pressure control as above -As needed nitroglycerin and morphine -Continue aspirin, Lipitor, Toprol   Chest pain - See above   Acute renal failure superimposed on stage 3a chronic kidney disease ([place]) suspected renal artery stenosis on recent CT abdomen Possibly due to dehydration and continuation for ramipril and HCTZ.  Hypertensive urgency may have contributed partially. -Hold ramipril and HCTZ -IV normal saline 75 cc/h --baseline creat 1.02 (feb 2023) --came in with creat 2.6--2.3--2.47--2.3 -- nephrology consultation with Dr. Candiss Norse -- Renal duplex ultrasound-- within normal limits -- discussed with vascular surgery Dr. Lucky Cowboy- recommends continue IV fluids will consider angiogram on Monday   Bilateral carotid artery stenosis - Continue aspirin and Lipitor   Hyperlipemia - Lipitor   Type II diabetes mellitus with renal manifestations ([place]) Recent A1c 6.0, well controlled.  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  >50% time spent on counselling and coordination of care  Note: This dictation was prepared with Dragon dictation along with smaller phrase technology. Any transcriptional errors that result from this process are unintentional.  Fritzi Mandes M.D    [facility]   CC: Primary care physician; Idelle Crouch, MD

## 2021-10-16 ENCOUNTER — Encounter
Admission: EM | Disposition: A | Discharge status: Home Health | Payer: Self-pay | Source: Home / Self Care | Attending: Internal Medicine

## 2021-10-16 DIAGNOSIS — I700 Atherosclerosis of aorta: Secondary | ICD-10-CM

## 2021-10-16 DIAGNOSIS — I150 Renovascular hypertension: Secondary | ICD-10-CM

## 2021-10-16 DIAGNOSIS — I1A0 Resistant hypertension: Secondary | ICD-10-CM | POA: Diagnosis not present

## 2021-10-16 DIAGNOSIS — I701 Atherosclerosis of renal artery: Secondary | ICD-10-CM

## 2021-10-16 DIAGNOSIS — N179 Acute kidney failure, unspecified: Secondary | ICD-10-CM | POA: Diagnosis not present

## 2021-10-16 DIAGNOSIS — E785 Hyperlipidemia, unspecified: Secondary | ICD-10-CM | POA: Diagnosis not present

## 2021-10-16 DIAGNOSIS — I160 Hypertensive urgency: Secondary | ICD-10-CM | POA: Diagnosis not present

## 2021-10-16 HISTORY — PX: RENAL ANGIOGRAPHY: CATH118260

## 2021-10-16 LAB — BASIC METABOLIC PANEL
Anion gap: 7 (ref 5–15)
BUN: 38 mg/dL — ABNORMAL HIGH (ref 8–23)
CO2: 21 mmol/L — ABNORMAL LOW (ref 22–32)
Calcium: 8.9 mg/dL (ref 8.9–10.3)
Chloride: 107 mmol/L (ref 98–111)
Creatinine, Ser: 2.19 mg/dL — ABNORMAL HIGH (ref 0.44–1.00)
GFR, Estimated: 23 mL/min — ABNORMAL LOW (ref >60)
Glucose, Bld: 145 mg/dL — ABNORMAL HIGH (ref 70–99)
Potassium: 4.6 mmol/L (ref 3.5–5.1)
Sodium: 135 mmol/L (ref 135–145)

## 2021-10-16 LAB — GLUCOSE, CAPILLARY
Glucose-Capillary: 150 mg/dL — ABNORMAL HIGH (ref 70–99)
Glucose-Capillary: 138 mg/dL — ABNORMAL HIGH (ref 70–99)
Glucose-Capillary: 295 mg/dL — ABNORMAL HIGH (ref 70–99)
Glucose-Capillary: 128 mg/dL — ABNORMAL HIGH (ref 70–99)

## 2021-10-16 LAB — CREATININE, SERUM
Creatinine, Ser: 2.28 mg/dL — ABNORMAL HIGH (ref 0.44–1.00)
GFR, Estimated: 21 mL/min — ABNORMAL LOW (ref >60)

## 2021-10-16 LAB — BUN: BUN: 36 mg/dL — ABNORMAL HIGH (ref 8–23)

## 2021-10-16 SURGERY — RENAL ANGIOGRAPHY
Anesthesia: Moderate Sedation | Laterality: Bilateral

## 2021-10-16 MED ORDER — MIDAZOLAM HCL 2 MG/2ML IJ SOLN
INTRAMUSCULAR | Status: DC | PRN
  Administered 2021-10-16: 2 mg via INTRAVENOUS

## 2021-10-16 MED ORDER — SODIUM CHLORIDE 0.9 % IV SOLN: INTRAVENOUS | Status: DC

## 2021-10-16 MED ORDER — ONDANSETRON HCL 4 MG/2ML IJ SOLN: 4.0000 mg | Freq: Four times a day (QID) | INTRAMUSCULAR | Status: DC | PRN

## 2021-10-16 MED ORDER — MIDAZOLAM HCL 2 MG/ML PO SYRP: 8.0000 mg | ORAL_SOLUTION | Freq: Once | ORAL | Status: DC | PRN

## 2021-10-16 MED ORDER — MANNITOL 25 % IV SOLN
INTRAVENOUS | Status: AC
  Filled 2021-10-16: qty 50

## 2021-10-16 MED ORDER — HYDROMORPHONE HCL 1 MG/ML IJ SOLN: 1.0000 mg | Freq: Once | INTRAMUSCULAR | Status: DC | PRN

## 2021-10-16 MED ORDER — HEPARIN SODIUM (PORCINE) 1000 UNIT/ML IJ SOLN
INTRAMUSCULAR | Status: AC
  Filled 2021-10-16: qty 10

## 2021-10-16 MED ORDER — FAMOTIDINE 20 MG PO TABS: 40.0000 mg | ORAL_TABLET | Freq: Once | ORAL | Status: DC | PRN

## 2021-10-16 MED ORDER — FENTANYL CITRATE (PF) 100 MCG/2ML IJ SOLN
INTRAMUSCULAR | Status: DC | PRN
  Administered 2021-10-16: 50 ug via INTRAVENOUS

## 2021-10-16 MED ORDER — MANNITOL 25% IV SOLUTION 12.5G/50ML SYRINGE
INTRAVENOUS | Status: AC | PRN
  Administered 2021-10-16: 12.5 g via INTRAVENOUS

## 2021-10-16 MED ORDER — DIPHENHYDRAMINE HCL 50 MG/ML IJ SOLN: 50.0000 mg | Freq: Once | INTRAMUSCULAR | Status: DC | PRN

## 2021-10-16 MED ORDER — METHYLPREDNISOLONE SODIUM SUCC 125 MG IJ SOLR: 125.0000 mg | Freq: Once | INTRAMUSCULAR | Status: DC | PRN

## 2021-10-16 MED ORDER — HEPARIN SODIUM (PORCINE) 1000 UNIT/ML IJ SOLN
INTRAMUSCULAR | Status: DC | PRN
  Administered 2021-10-16: 5000 [IU] via INTRAVENOUS

## 2021-10-16 MED ORDER — IODIXANOL 320 MG/ML IV SOLN
INTRAVENOUS | Status: DC | PRN
  Administered 2021-10-16: 40 mL

## 2021-10-16 MED ORDER — MIDAZOLAM HCL 2 MG/2ML IJ SOLN
INTRAMUSCULAR | Status: AC
  Filled 2021-10-16: qty 2

## 2021-10-16 MED ORDER — CEFAZOLIN SODIUM-DEXTROSE 2-4 GM/100ML-% IV SOLN
2.0000 g | INTRAVENOUS | Status: AC
  Administered 2021-10-16: 2 g via INTRAVENOUS

## 2021-10-16 MED ORDER — FENTANYL CITRATE PF 50 MCG/ML IJ SOSY
PREFILLED_SYRINGE | INTRAMUSCULAR | Status: AC
  Filled 2021-10-16: qty 1

## 2021-10-16 SURGICAL SUPPLY — 17 items
BALLN ULTRVRSE 4X40X75C (BALLOONS) ×1
BALLOON ULTRVRSE 4X40X75C (BALLOONS) IMPLANT
CATH ANGIO 5F PIGTAIL 65CM (CATHETERS) IMPLANT
CATH BEACON 5 .035 65 C2 TIP (CATHETERS) IMPLANT
COVER PROBE ULTRASOUND 5X96 (MISCELLANEOUS) IMPLANT
DEVICE STARCLOSE SE CLOSURE (Vascular Products) IMPLANT
DEVICE TORQUE .025-.038 (MISCELLANEOUS) IMPLANT
GLIDEWIRE STIFF .35X180X3 HYDR (WIRE) IMPLANT
KIT ENCORE 26 ADVANTAGE (KITS) IMPLANT
PACK ANGIOGRAPHY (CUSTOM PROCEDURE TRAY) ×1 IMPLANT
SHEATH BRITE TIP 5FRX11 (SHEATH) IMPLANT
SHEATH HIGHFLEX ANSEL 6FRX55 (SHEATH) IMPLANT
STENT LIFESTREAM 6X26X80 (Permanent Stent) IMPLANT
SYR MEDRAD MARK 7 150ML (SYRINGE) IMPLANT
TUBING CONTRAST HIGH PRESS 72 (TUBING) IMPLANT
WIRE GUIDERIGHT .035X150 (WIRE) IMPLANT
WIRE MAGIC TOR.035 180C (WIRE) IMPLANT

## 2021-10-16 NOTE — Progress Notes (Signed)
Dr. Lucky Cowboy in at bedside, speaking with pt. And her brother re: procedural results. Both verbalized understanding of conversation.

## 2021-10-16 NOTE — Op Note (Signed)
Dawn Ford  Percutaneous Study/Intervention Procedural Note    Surgeon(s): [employer]   Assistants: None  Pre-operative Diagnosis: Bilateral renal artery stenosis, renovascular hypertension  Post-operative diagnosis:  Same  Procedure(s) Performed:             1.  Ultrasound guidance for vascular access right femoral artery             2.  Catheter placement into bilateral renal arteries from right femoral approach             3.  Aortogram and selective lateral renal angiograms             4.  Balloon expandable stent placement to the left renal artery with a 6 mm diameter x 26 mm length stent              5.  StarClose closure device right femoral artery              Contrast: 40 cc  EBL: 5 cc  Fluoro Time: 5.9 minutes  Moderate conscious sedation: Approximately 26 minutes with 2 mg of Versed and 50 mcg of Fentanyl  Indications:  The patient is a 89 with worsening severe hypertension despite multiple medications and a recent admission with malignant hypertension and acute kidney injury. The patient has suboptimal blood pressure control despite multiple antihypertensives and a previous CT scan suggesting significant atherosclerotic disease of both renal arteries although this was almost a year ago. Given the clinical scenario and the noninvasive findings, angiogram is indicated for further evaluation of her renal artery and potential treatment. Risks and benefits are discussed and informed consent is obtained.  Procedure:  The patient was identified and appropriate procedural time out was performed.  The patient was then placed supine on the table and prepped and draped in the usual sterile fashion. Moderate conscious sedation was administered with a face to face encounter with the patient throughout the procedure with my supervision of the RN administering medicines and monitoring the patients vital signs and mental status throughout from the start of the  procedure until the patient was taken to the recovery room  Ultrasound was used to evaluate the right common femoral artery.  It was patent .  A digital ultrasound image was acquired.  A Seldinger needle was used to access the right common femoral artery under direct ultrasound guidance and a permanent image was performed.  A 0.035 J wire was advanced without resistance and a 5Fr sheath was placed.  Pigtail catheter was placed into the aorta at the L1 level and an AP aortogram was performed. This demonstrated a patent stent in the right common and external iliac arteries with successful coil embolization of the right internal iliac artery for previous aneurysm repair.  The aorta was quite calcific but not stenotic.  The left iliac artery appeared to be patent.  Both renal arteries appear to be somewhat heavily diseased and I elected to perform selective imaging of both renal arteries. The patient was then systemically heparinized with 5000 units of intravenous heparin and 12-1/2 g of mannitol were given. I used a C2 catheter to cannulate the first the right renal artery and then the left renal artery and selective imaging was performed. This confirmed a greater than 80% stenosis of the right and left renal arteries.  The left appeared to be slightly worse and was the larger of the 2 arteries by appearance so I elected to treat this 1 first.  At  this point I selected the Magic torque wire and crossed the lesion without difficulty.  I then placed an Ansell sheath into the left renal artery over the Magic torque wire. I then selected a 6 mm diameter x 26 mm length lifestream balloon expandable stent and brought this across the lesion.  This was deployed encompassing the lesion with its proximal extent going back into the aorta for a mm or two.  This was inflated to 10 ATM and the waist resolved.  Completion angiogram showed about a 15 to 20% residual stenosis. The right renal artery was not treated today for concern  of a significant drop in her blood pressure and potential worsening renal dysfunction, and we will assess her clinically in 4 to 6 weeks to determine whether or not we proceed with intervention of the right renal artery.  Oblique arteriogram was performed of the right femoral artery and StarClose closure device was deployed in the usual fashion with excellent hemostatic result. The patient was taken to the recovery room in stable condition having tolerated the procedure well.  Findings:               Aortogram/Renal Arteries: Aorta was highly calcific.  The right iliac stents were widely patent.  Left iliac arteries are patent.  Both renal arteries had greater than 80% stenosis.   Condition:  Stable  Complications: None   Dawn Ford 10/16/2021 1:15 PM  This note was created with Dragon Medical transcription system. Any errors in dictation are purely unintentional.

## 2021-10-16 NOTE — Interval H&P Note (Signed)
History and Physical Interval Note:  10/16/2021 11:48 AM  Dawn Ford  has presented today for surgery, with the diagnosis of renal artery stenosis, hypertension.  The various methods of treatment have been discussed with the patient and family. After consideration of risks, benefits and other options for treatment, the patient has consented to  Procedure(s): RENAL ANGIOGRAPHY (Bilateral) as a surgical intervention.  The patient's history has been reviewed, patient examined, no change in status, stable for surgery.  I have reviewed the patient's chart and labs.  Questions were answered to the patient's satisfaction.     [name]

## 2021-10-16 NOTE — Progress Notes (Signed)
Dawn Ford  ROUNDING NOTE   Subjective:   Patient seen laying in bed, family at bedside Alert and oriented Currently n.p.o. for vascular procedure No complaints to offer at this time  Creatinine 2.19  Objective:  Vital signs in last 24 hours:  Temp:  [98.4 F (36.9 C)-99.5 F (37.5 C)] 98.4 F (36.9 C) (10/09 1140) Pulse Rate:  [64-88] 65 (10/09 1345) Resp:  [10-20] 17 (10/09 1330) BP: (132-176)/(48-70) 152/52 (10/09 1345) SpO2:  [94 %-100 %] 99 % (10/09 1345) Weight:  [85.1 kg] 85.1 kg (10/09 1140)  Weight change:  Filed Weights   10/11/21 1209 10/12/21 1355 10/16/21 1140  Weight: 84.8 kg 85.1 kg 85.1 kg    Intake/Output: I/O last 3 completed shifts: In: 180 [P.O.:180] Out: -    Intake/Output this shift:  No intake/output data recorded.  Physical Exam: General: NAD  Head: Normocephalic, atraumatic. Moist oral mucosal membranes  Eyes: Anicteric  Lungs:  Clear to auscultation, normal effort  Heart: Regular rate and rhythm  Abdomen:  Soft, nontender  Extremities:  No peripheral edema.  Neurologic: Nonfocal, moving all four extremities  Skin: No lesions  Access: None    Basic Metabolic Panel: Recent Labs  Lab 10/12/21 0549 10/13/21 0501 10/14/21 0900 10/15/21 1014 10/16/21 0434 10/16/21 1439  NA 140 138 137 137 135  --   K 4.2 4.3 4.2 4.4 4.6  --   CL 109 109 111 109 107  --   CO2 24 22 22  20* 21*  --   GLUCOSE 85 117* 148* 279* 145*  --   BUN 40* 40* 37* 36* 38* 36*  CREATININE 2.34* 2.47* 2.39* 2.32* 2.19* 2.28*  CALCIUM 8.5* 8.7* 8.8* 9.0 8.9  --      Liver Function Tests: No results for input(s): "AST", "ALT", "ALKPHOS", "BILITOT", "PROT", "ALBUMIN" in the last 168 hours. No results for input(s): "LIPASE", "AMYLASE" in the last 168 hours. No results for input(s): "AMMONIA" in the last 168 hours.  CBC: Recent Labs  Lab 10/11/21 1218  WBC 7.7  HGB 11.7*  HCT 36.6  MCV 87.8  PLT 222     Cardiac Enzymes: No results  for input(s): "CKTOTAL", "CKMB", "CKMBINDEX", "TROPONINI" in the last 168 hours.  BNP: Invalid input(s): "POCBNP"  CBG: Recent Labs  Lab 10/15/21 1733 10/15/21 2028 10/15/21 2124 10/16/21 0754 10/16/21 1128  GLUCAP 160* 212* 210* 150* 138*     Microbiology: Results for orders placed or performed during the hospital encounter of 03/01/21  MRSA Next Gen by PCR, Nasal     Status: None   Collection Time: 03/01/21  4:14 PM   Specimen: Nasal Mucosa; Nasal Swab  Result Value Ref Range Status   MRSA by PCR Next Gen NOT DETECTED NOT DETECTED Final    Comment: (NOTE) The GeneXpert MRSA Assay (FDA approved for NASAL specimens only), is one component of a comprehensive MRSA colonization surveillance program. It is not intended to diagnose MRSA infection nor to guide or monitor treatment for MRSA infections. Test performance is not FDA approved in patients less than 63 years old. Performed at [facility], [address] [place], [place] [address]     Coagulation Studies: No results for input(s): "LABPROT", "INR" in the last 72 hours.   Urinalysis: No results for input(s): "COLORURINE", "LABSPEC", "PHURINE", "GLUCOSEU", "HGBUR", "BILIRUBINUR", "KETONESUR", "PROTEINUR", "UROBILINOGEN", "NITRITE", "LEUKOCYTESUR" in the last 72 hours.  Invalid input(s): "APPERANCEUR"    Imaging: PERIPHERAL VASCULAR CATHETERIZATION  Result Date: 10/16/2021 See surgical note for  result.    Medications:      amLODipine  10 mg Oral Daily   aspirin EC  81 mg Oral Daily   atorvastatin  40 mg Oral QHS   cloNIDine  0.1 mg Oral BID   famotidine  20 mg Oral QHS   fentaNYL       heparin  5,000 Units Subcutaneous Q8H   heparin sodium (porcine)       hydrALAZINE  100 mg Oral TID   insulin aspart  0-5 Units Subcutaneous QHS   insulin aspart  0-9 Units Subcutaneous TID WC   mannitol       metoprolol tartrate  75 mg Oral BID   midazolam       acetaminophen, fentaNYL, heparin sodium  (porcine), hydrALAZINE, HYDROmorphone (DILAUDID) injection, loratadine, mannitol, midazolam, ondansetron (ZOFRAN) IV, ondansetron (ZOFRAN) IV  Assessment/ Plan:  [name] is a 76 y.o.  female  with past medical conditions including diabetes, CAD, hypertension, bilateral carotid artery stenosis, abdominal aorta and iliac artery aneurysm, hyperlipidemia, and chronic Ford disease stage IIIa, who was admitted to [facility] on 10/11/2021 for Hypertensive crisis [I16.9] Hypertensive urgency [I16.0] Hypertension, unspecified type [I10] Acute renal failure ([place]) [N17.9]   Acute Ford injury on chronic Ford disease stage IIIa.  Baseline creatinine appears to be 1.1 with GFR 58 on 06/29/2021.  AKI in the setting of malignant hypertension possibly secondary to renal artery stenosis. CT angio abdomen pelvis from February 13, 2021 shows suspected significant narrowing in both renal arteries along with large amount of calcified atherosclerotic plaque within the abdominal aorta.  Renal artery duplex ultrasound negative for stenosis.   Renal function slowly improving.  Appreciate vascular performing angiogram with stent placement today.  Stents placed in left renal artery.  Will order normal saline at 125 mL/h after procedure for hydration.  Lab Results  Component Value Date   CREATININE 2.28 (H) 10/16/2021   CREATININE 2.19 (H) 10/16/2021   CREATININE 2.32 (H) 10/15/2021   No intake or output data in the 24 hours ending 10/16/21 1520  2. Hypertensive urgency with chronic Ford disease.  Blood pressure on ED arrival 203/68.  Home regimen includes hydrochlorothiazide, metoprolol, ramipril, and recently added amlodipine.  Currently receiving these medications except ramipril.  Clonidine 0.1 mg ordered yesterday twice daily.  Blood pressure slightly improved, 165/67.  We will expect slow improvement in blood pressure after procedure today.    3. Anemia of chronic Ford disease  Lab Results  Component  Value Date   HGB 11.7 (L) 10/11/2021    Hgb at goal   LOS: 2 [place] 10/9/20233:20 PM

## 2021-10-16 NOTE — Progress Notes (Signed)
[facility] [place] NAME: Dawn Ford    MR#:  [id number]  DATE OF BIRTH:  08/13/1945  SUBJECTIVE:  overall doing well. Good urine output. Blood pressure still remains on the higher side despite adjusting meds Brother at bedside  VITALS:  Blood pressure (!) 152/52, pulse 65, temperature 98.4 F (36.9 C), temperature source Oral, resp. rate 17, height 5\' 6"  (1.676 m), weight 85.1 kg, SpO2 99 %.  PHYSICAL EXAMINATION:   GENERAL:  76 y.o.-year-old patient lying in the bed with no acute distress.  LUNGS: Normal breath sounds bilaterally, no wheezing CARDIOVASCULAR: S1, S2 normal. No murmurs,   ABDOMEN: Soft, nontender, nondistended. Bowel sounds present.  EXTREMITIES: No  edema b/l.    NEUROLOGIC: nonfocal  patient is alert and awake SKIN: No obvious rash, lesion, or ulcer.   LABORATORY PANEL:  CBC Recent Labs  Lab 10/11/21 1218  WBC 7.7  HGB 11.7*  HCT 36.6  PLT 222     Chemistries  Recent Labs  Lab 10/16/21 0434  NA 135  K 4.6  CL 107  CO2 21*  GLUCOSE 145*  BUN 38*  CREATININE 2.19*  CALCIUM 8.9    Cardiac Enzymes No results for input(s): "TROPONINI" in the last 168 hours. RADIOLOGY:  PERIPHERAL VASCULAR CATHETERIZATION  Result Date: 10/16/2021 See surgical note for result.   Assessment and Plan  Dawn Ford is a 77 y.o. 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    Patient states that her blood pressure has not been controlled well recently.  She had elevated blood pressure up to SBP 200 yesterday.  Her cardiologist added amlodipine 5 mg to current regimen (including ramipril, HCTZ and metoprolol).    Hypertensive urgency Initially blood pressure 203/68, which improved to 188/64 after giving 10 mg of IV labetalol.  Heart rate 58, will not give more IV  beta-blocker.  -Increase home amlodipine dose from 5 to 10 mg daily -Continue home metoprolol 50 mg twice daily--increase to 75 mg bid - oral hydralazine 100 mg 3 times daily -clonidine  bid (dr singh)   Coronary artery disease Chest pain and history of CAD: Her chest pain has resolved.  Troponin negative x2.  Possibly due to demand ischemia secondary to hypertensive urgency. -Blood pressure control as above -As needed nitroglycerin and morphine -Continue aspirin, Lipitor, Toprol   Acute renal failure superimposed on stage 3a chronic kidney disease ([name]) suspected renal artery stenosis on recent CT abdomen Possibly due to dehydration and continuation for ramipril and HCTZ.  Hypertensive urgency may have contributed partially. -Hold ramipril and HCTZ -IV normal saline 75 cc/h --baseline creat 1.02 (feb 2023) --came in with creat 2.6--2.3--2.47--2.3 -- nephrology consultation with Dr. Candiss Norse -- Renal duplex ultrasound-- within normal limits -- discussed with vascular surgery Dr. Lucky Cowboy- recommends continue IV fluids will consider angiogram on Monday --10/9--s/p left renal artery stent placement   Bilateral carotid artery stenosis - Continue aspirin and Lipitor   Hyperlipemia - Lipitor   Type II diabetes mellitus with renal manifestations ([name]) Recent A1c 6.0, well controlled.  Patient is still taking Victoza, Actos and Amaryl -Sliding scale insulin   Ectatic abdominal aorta (HCC) - Follow-up with PCP   Iliac artery aneurysm ([name]) -f/u with PCP    Procedures: left renal stent placement Family communication :none Consults :none CODE STATUS: full DVT Prophylaxis :  heparin Level of care: Telemetry Cardiac Status is: inpatient  TOTAL TIME TAKING CARE OF THIS PATIENT: 35 minutes.  >50% time spent on counselling and coordination of care  Note: This dictation was prepared with Dragon dictation along with smaller phrase technology. Any transcriptional errors that result from this  process are unintentional.  Fritzi Mandes M.D    [facility]   CC: Primary care physician; Idelle Crouch, MD

## 2021-10-17 ENCOUNTER — Inpatient Hospital Stay: Payer: Medicare Other

## 2021-10-17 DIAGNOSIS — I701 Atherosclerosis of renal artery: Secondary | ICD-10-CM | POA: Diagnosis not present

## 2021-10-17 DIAGNOSIS — N179 Acute kidney failure, unspecified: Secondary | ICD-10-CM | POA: Diagnosis not present

## 2021-10-17 DIAGNOSIS — I160 Hypertensive urgency: Secondary | ICD-10-CM | POA: Diagnosis not present

## 2021-10-17 DIAGNOSIS — E785 Hyperlipidemia, unspecified: Secondary | ICD-10-CM | POA: Diagnosis not present

## 2021-10-17 LAB — BASIC METABOLIC PANEL
Anion gap: 12 (ref 5–15)
BUN: 40 mg/dL — ABNORMAL HIGH (ref 8–23)
CO2: 18 mmol/L — ABNORMAL LOW (ref 22–32)
Calcium: 8.8 mg/dL — ABNORMAL LOW (ref 8.9–10.3)
Chloride: 104 mmol/L (ref 98–111)
Creatinine, Ser: 2.98 mg/dL — ABNORMAL HIGH (ref 0.44–1.00)
GFR, Estimated: 16 mL/min — ABNORMAL LOW (ref >60)
Glucose, Bld: 170 mg/dL — ABNORMAL HIGH (ref 70–99)
Potassium: 5.1 mmol/L (ref 3.5–5.1)
Sodium: 134 mmol/L — ABNORMAL LOW (ref 135–145)

## 2021-10-17 LAB — GLUCOSE, CAPILLARY
Glucose-Capillary: 158 mg/dL — ABNORMAL HIGH (ref 70–99)
Glucose-Capillary: 152 mg/dL — ABNORMAL HIGH (ref 70–99)
Glucose-Capillary: 260 mg/dL — ABNORMAL HIGH (ref 70–99)

## 2021-10-17 LAB — CBC
HCT: 29.7 % — ABNORMAL LOW (ref 36.0–46.0)
Hemoglobin: 9.8 g/dL — ABNORMAL LOW (ref 12.0–15.0)
MCH: 28 pg (ref 26.0–34.0)
MCHC: 33 g/dL (ref 30.0–36.0)
MCV: 84.9 fL (ref 80.0–100.0)
Platelets: 175 10*3/uL (ref 150–400)
RBC: 3.5 MIL/uL — ABNORMAL LOW (ref 3.87–5.11)
RDW: 13.7 % (ref 11.5–15.5)
WBC: 13 10*3/uL — ABNORMAL HIGH (ref 4.0–10.5)
nRBC: 0 % (ref 0.0–0.2)

## 2021-10-17 MED ORDER — PATIROMER SORBITEX CALCIUM 8.4 G PO PACK
16.8000 g | PACK | Freq: Once | ORAL | Status: DC
  Filled 2021-10-17: qty 2

## 2021-10-17 MED ORDER — MORPHINE SULFATE (PF) 2 MG/ML IV SOLN: 1.0000 mg | Freq: Four times a day (QID) | INTRAVENOUS | Status: DC | PRN

## 2021-10-17 MED ORDER — CLONIDINE HCL 0.1 MG PO TABS
0.2000 mg | ORAL_TABLET | Freq: Two times a day (BID) | ORAL | Status: DC
  Administered 2021-10-17 – 2021-10-19 (×5): 0.2 mg via ORAL
  Filled 2021-10-17 (×5): qty 2

## 2021-10-17 NOTE — Progress Notes (Signed)
[facility] at [place] NAME: Dawn Ford    MR#:  [id number]  DATE OF BIRTH:  1945-05-03  SUBJECTIVE:  overall doing well. Good urine output. Blood pressure still remains on the higher side despite adjusting meds C/o abdominal pain overnite, new requiring IV pain meds  VITALS:  Blood pressure (!) 183/58, pulse 81, temperature 98.9 F (37.2 C), temperature source Oral, resp. rate 16, height 5\' 6"  (1.676 m), weight 85.1 kg, SpO2 96 %.  PHYSICAL EXAMINATION:   GENERAL:  76 y.o.-year-old patient lying in the bed with no acute distress.  LUNGS: Normal breath sounds bilaterally, no wheezing CARDIOVASCULAR: S1, S2 normal. No murmurs,   ABDOMEN: Soft, nontender, nondistended.no guarding or rigidity EXTREMITIES: No  edema b/l.    NEUROLOGIC: nonfocal  patient is alert and awake  LABORATORY PANEL:  CBC Recent Labs  Lab 10/17/21 0329  WBC 13.0*  HGB 9.8*  HCT 29.7*  PLT 175     Chemistries  Recent Labs  Lab 10/17/21 0329  NA 134*  K 5.1  CL 104  CO2 18*  GLUCOSE 170*  BUN 40*  CREATININE 2.98*  CALCIUM 8.8*    Cardiac Enzymes No results for input(s): "TROPONINI" in the last 168 hours. RADIOLOGY:  PERIPHERAL VASCULAR CATHETERIZATION  Result Date: 10/16/2021 See surgical note for result.   Assessment and Plan  Dawn Ford is a 76 y.o. 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    Patient states that her blood pressure has not been controlled well recently.  She had elevated blood pressure up to SBP 200 yesterday.  Her cardiologist added amlodipine 5 mg to current regimen (including ramipril, HCTZ and metoprolol).    Hypertensive urgency Initially blood pressure 203/68, which improved to 188/64 after giving 10 mg of IV labetalol.  Heart rate 58, will not give more IV  beta-blocker.  -Increase home amlodipine dose from 5 to 10 mg daily -Continue home metoprolol 50 mg twice daily--increase to 75 mg bid - oral hydralazine 100 mg 3 times daily -clonidine 0.2 mg bid (dr singh)   Coronary artery disease Chest pain and history of CAD: Her chest pain has resolved.  Troponin negative x2.  Possibly due to demand ischemia secondary to hypertensive urgency. -Blood pressure control as above -As needed nitroglycerin and morphine -Continue aspirin, Lipitor, Toprol   Acute renal failure superimposed on stage 3a chronic kidney disease ([place]) Bilateral Renal artery stenosis on recent CT abdomen (feb 2023) Possibly due to dehydration and continuation for ramipril and HCTZ.  Hypertensive urgency may have contributed partially. -Hold ramipril and HCTZ -IV normal saline 75 cc/h --baseline creat 1.02 (feb 2023) --came in with creat 2.6--2.3--2.47--2.3--2.9 -- nephrology consultation with Dr. Candiss Norse -- Renal duplex ultrasound-- within normal limits -- discussed with vascular surgery Dr. Lucky Cowboy- recommends continue IV fluids and consider angiogram on Monday --10/9--s/p left renal artery stent placement --10/10-- CT abdomen pelvis done without contrast shows perinephric stranding likley due to  mild infarction post procedure causing abdominal pain. -- Discussed with Dr. Lucky Cowboy recommends continue PRN pain meds and supportive care   Bilateral carotid artery stenosis - Continue aspirin and Lipitor   Hyperlipemia - Lipitor   Type II diabetes mellitus with renal manifestations (HCC) Recent A1c 6.0, well controlled.  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  >50% time spent on counselling and coordination of care  Note: This dictation was prepared with Dragon dictation along with smaller phrase technology. Any transcriptional errors that result from this process are unintentional.  Fritzi Mandes M.D    [facility]   CC: Primary care physician; Idelle Crouch, MD

## 2021-10-17 NOTE — Care Management Important Message (Signed)
Important Message  Patient Details  Name: Dawn Ford MRN: [id number] Date of Birth: 05-09-1945   Medicare Important Message Given:  Yes  Patient asleep upon time of visit.  Copy of Medicare IM left in room on windowsill for reference.   [name] 10/17/2021, 10:41 AM

## 2021-10-17 NOTE — Progress Notes (Signed)
Pt called out and stated that she was bleeding , pt was in bathroom upon entering the room. Pt right groin dressing was saturated. Had pt lay down and applied new pressure dressing. Vascular sx paged , called back and stated to apply pressure dressing and have pt be on bedrest until 10 am tomorrow morning (10/17/2021). Will continue to monitor site, call bell within reach.

## 2021-10-17 NOTE — Progress Notes (Addendum)
Pt states that her abdomen really hurts and feels full . Bilateral sides of abdomen are rigid. Vascular surgeon Schnier notified states that he will check it in the morning.

## 2021-10-17 NOTE — Progress Notes (Addendum)
[facility]  ROUNDING NOTE   Subjective:   Patient seen laying in bed and oriented Complains of lower abdominal soreness Feels procedure went well yesterday  Creatinine 2.98  Objective:  Vital signs in last 24 hours:  Temp:  [98 F (36.7 C)-99.9 F (37.7 C)] 98.5 F (36.9 C) (10/10 1210) Pulse Rate:  [68-83] 68 (10/10 1210) Resp:  [14-20] 16 (10/10 1210) BP: (153-190)/(48-70) 153/48 (10/10 1210) SpO2:  [90 %-97 %] 96 % (10/10 1210)  Weight change:  Filed Weights   10/11/21 1209 10/12/21 1355 10/16/21 1140  Weight: 84.8 kg 85.1 kg 85.1 kg    Intake/Output: I/O last 3 completed shifts: In: 1062.4 [I.V.:1062.4] Out: -    Intake/Output this shift:  No intake/output data recorded.  Physical Exam: General: NAD  Head: Normocephalic, atraumatic. Moist oral mucosal membranes  Eyes: Anicteric  Lungs:  Clear to auscultation, normal effort  Heart: Regular rate and rhythm  Abdomen:  Soft, nontender  Extremities:  No peripheral edema.  Neurologic: Nonfocal, moving all four extremities  Skin: No lesions  Access: None    Basic Metabolic Panel: Recent Labs  Lab 10/13/21 0501 10/14/21 0900 10/15/21 1014 10/16/21 0434 10/16/21 1439 10/17/21 0329  NA 138 137 137 135  --  134*  K 4.3 4.2 4.4 4.6  --  5.1  CL 109 111 109 107  --  104  CO2 22 22 20* 21*  --  18*  GLUCOSE 117* 148* 279* 145*  --  170*  BUN 40* 37* 36* 38* 36* 40*  CREATININE 2.47* 2.39* 2.32* 2.19* 2.28* 2.98*  CALCIUM 8.7* 8.8* 9.0 8.9  --  8.8*     Liver Function Tests: No results for input(s): "AST", "ALT", "ALKPHOS", "BILITOT", "PROT", "ALBUMIN" in the last 168 hours. No results for input(s): "LIPASE", "AMYLASE" in the last 168 hours. No results for input(s): "AMMONIA" in the last 168 hours.  CBC: Recent Labs  Lab 10/11/21 1218 10/17/21 0329  WBC 7.7 13.0*  HGB 11.7* 9.8*  HCT 36.6 29.7*  MCV 87.8 84.9  PLT 222 175     Cardiac Enzymes: No results for input(s):  "CKTOTAL", "CKMB", "CKMBINDEX", "TROPONINI" in the last 168 hours.  BNP: Invalid input(s): "POCBNP"  CBG: Recent Labs  Lab 10/16/21 1128 10/16/21 1628 10/16/21 2206 10/17/21 0733 10/17/21 1211  GLUCAP 138* 295* 128* 158* 152*     Microbiology: Results for orders placed or performed during the hospital encounter of 03/01/21  MRSA Next Gen by PCR, Nasal     Status: None   Collection Time: 03/01/21  4:14 PM   Specimen: Nasal Mucosa; Nasal Swab  Result Value Ref Range Status   MRSA by PCR Next Gen NOT DETECTED NOT DETECTED Final    Comment: (NOTE) The GeneXpert MRSA Assay (FDA approved for NASAL specimens only), is one component of a comprehensive MRSA colonization surveillance program. It is not intended to diagnose MRSA infection nor to guide or monitor treatment for MRSA infections. Test performance is not FDA approved in patients less than 53 years old. Performed at [facility], [address] [place], [place] [address]     Coagulation Studies: No results for input(s): "LABPROT", "INR" in the last 72 hours.   Urinalysis: No results for input(s): "COLORURINE", "LABSPEC", "PHURINE", "GLUCOSEU", "HGBUR", "BILIRUBINUR", "KETONESUR", "PROTEINUR", "UROBILINOGEN", "NITRITE", "LEUKOCYTESUR" in the last 72 hours.  Invalid input(s): "APPERANCEUR"    Imaging: PERIPHERAL VASCULAR CATHETERIZATION  Result Date: 10/16/2021 See surgical note for result.    Medications:  sodium chloride Stopped (10/17/21 0242)     amLODipine  10 mg Oral Daily   aspirin EC  81 mg Oral Daily   atorvastatin  40 mg Oral QHS   cloNIDine  0.2 mg Oral BID   famotidine  20 mg Oral QHS   heparin  5,000 Units Subcutaneous Q8H   hydrALAZINE  100 mg Oral TID   insulin aspart  0-5 Units Subcutaneous QHS   insulin aspart  0-9 Units Subcutaneous TID WC   metoprolol tartrate  75 mg Oral BID   patiromer  16.8 g Oral Once   acetaminophen, hydrALAZINE, HYDROmorphone (DILAUDID) injection,  loratadine, ondansetron (ZOFRAN) IV, ondansetron (ZOFRAN) IV  Assessment/ Plan:  Dawn Ford is a 76 y.o.  female  with past medical conditions including diabetes, CAD, hypertension, bilateral carotid artery stenosis, abdominal aorta and iliac artery aneurysm, hyperlipidemia, and chronic kidney disease stage IIIa, who was admitted to [facility] on 10/11/2021 for Hypertensive crisis [I16.9] Hypertensive urgency [I16.0] Hypertension, unspecified type [I10] Acute renal failure ([place]) [N17.9]   Acute kidney injury on chronic kidney disease stage IIIa.  Baseline creatinine appears to be 1.1 with GFR 58 on 06/29/2021.  AKI in the setting of malignant hypertension possibly secondary to renal artery stenosis. CT angio abdomen pelvis from February 13, 2021 shows suspected significant narrowing in both renal arteries along with large amount of calcified atherosclerotic plaque within the abdominal aorta.  Renal artery duplex ultrasound negative for stenosis.   Appreciate vascular surgery placing left renal artery stent yesterday.  Vascular plans to attempt intervention in right renal artery and 4 to 6 weeks to minimize renal effects.  Creatinine elevated today however per procedure note, patient experienced hypotension during procedure along with contrast exposure.  Patient encouraged to increase fluid intake and we will continue IV fluids as prescribed yesterday.  We will continue to monitor.  Patient will need outpatient follow-up with nephrology at discharge.  Lab Results  Component Value Date   CREATININE 2.98 (H) 10/17/2021   CREATININE 2.28 (H) 10/16/2021   CREATININE 2.19 (H) 10/16/2021    Intake/Output Summary (Last 24 hours) at 10/17/2021 1347 Last data filed at 10/17/2021 0411 Gross per 24 hour  Intake 1062.43 ml  Output --  Net 1062.43 ml    2. Hypertensive urgency with chronic kidney disease.  Blood pressure on ED arrival 203/68.  Home regimen includes hydrochlorothiazide, metoprolol,  ramipril, and recently added amlodipine.  Currently receiving these medications except ramipril.  Blood pressure slowly improving, 153/48.  Primary team has increased clonidine to 0.2 mg twice daily.   3. Anemia of chronic kidney disease  Lab Results  Component Value Date   HGB 9.8 (L) 10/17/2021    Hgb at goal  4.  Hyperkalemia.  Potassium 5.1 today.  Will order [place] 16.4 g once to manage.   LOS: [place] 10/10/20231:47 PM

## 2021-10-17 NOTE — Progress Notes (Signed)
[facility]  Daily Progress Note   Subjective  -   Patient underwent left renal artery stent placement.  Overnight the patient had issues with bleeding after getting out of bed.  There is some noted bruising and new onset abdominal pain this morning.  Objective Vitals:   10/17/21 0730 10/17/21 1210 10/17/21 1528 10/17/21 2047  BP: (!) 183/58 (!) 153/48 (!) 152/56 (!) 172/66  Pulse: 81 68 69 77  Resp: 16 16 16 18   Temp: 98.9 F (37.2 C) 98.5 F (36.9 C) 98.5 F (36.9 C) 98.3 F (36.8 C)  TempSrc: Oral   Oral  SpO2: 96% 96% 98% 96%  Weight:      Height:        Intake/Output Summary (Last 24 hours) at 10/17/2021 2249 Last data filed at 10/17/2021 0411 Gross per 24 hour  Intake 1062.43 ml  Output --  Net 1062.43 ml    PULM  CTAB CV  RRR VASC  slight oozing from right groin.  No evidence of hematoma  Laboratory CBC    Component Value Date/Time   WBC 13.0 (H) 10/17/2021 0329   HGB 9.8 (L) 10/17/2021 0329   HCT 29.7 (L) 10/17/2021 0329   PLT 175 10/17/2021 0329    BMET    Component Value Date/Time   NA 134 (L) 10/17/2021 0329   K 5.1 10/17/2021 0329   K 3.8 10/07/2012 1128   CL 104 10/17/2021 0329   CO2 18 (L) 10/17/2021 0329   GLUCOSE 170 (H) 10/17/2021 0329   BUN 40 (H) 10/17/2021 0329   CREATININE 2.98 (H) 10/17/2021 0329   CALCIUM 8.8 (L) 10/17/2021 0329   GFRNONAA 16 (L) 10/17/2021 0329   GFRAA >60 12/13/2016 0834    Assessment/Planning: POD #1 s/p left renal artery stent placement  Patient had notable bleeding overnight however she was out of bed prior to bed rest ending.  Today dressing was reinforced.  Still a very scant amount of oozing.  Patient should try to maintain bedrest for several hours but following this we can try for ambulation once the bleeding has subsided. Has noted increased abdominal pain this morning that was not there previously.  Concern for possible retroperitoneal hematoma initially.  CT scan was done  which showed possible perinephric stranding due to mild infarction postprocedure.  Currently no further intervention recommended for this we recommend continued supportive care with pain management. The patient also had a right renal artery stenosis of 80%.  During the procedure she had a notable hypotensive episode and intervention was not performed on the right renal artery.  We will have her follow-up with [place] in 4 to 6 weeks to determine if it is necessary to proceed with intervention on the right renal artery, based on clinical picture at that time. Currently no further intervention planned from vascular perspective during his hospitalization. Patient should DC with Plavix, aspirin with plan follow-up in 4 weeks  Kris Hartmann  10/17/2021, 10:49 PM

## 2021-10-17 NOTE — Progress Notes (Signed)
Pt called out that site was rebledding, upon entering room pt was up in bathroom. Again discussed with pt that she is on bedrest and should not get up to go to the bathroom she states that she could not wait for someone to come take her to the bathroom. Dressing changed and purewick applied, bed alarm set . Provider Schnier notified of the above.

## 2021-10-17 NOTE — Progress Notes (Addendum)
Subcutaneous heparin held due to bleeding at groin site per provider Schnier. See MAR.

## 2021-10-17 NOTE — Plan of Care (Signed)

## 2021-10-18 ENCOUNTER — Encounter: Payer: Self-pay | Admitting: Vascular Surgery

## 2021-10-18 DIAGNOSIS — I160 Hypertensive urgency: Secondary | ICD-10-CM | POA: Diagnosis not present

## 2021-10-18 LAB — CBC
HCT: 25.8 % — ABNORMAL LOW (ref 36.0–46.0)
Hemoglobin: 8.8 g/dL — ABNORMAL LOW (ref 12.0–15.0)
MCH: 28.9 pg (ref 26.0–34.0)
MCHC: 34.1 g/dL (ref 30.0–36.0)
MCV: 84.9 fL (ref 80.0–100.0)
Platelets: 155 10*3/uL (ref 150–400)
RBC: 3.04 MIL/uL — ABNORMAL LOW (ref 3.87–5.11)
RDW: 13.3 % (ref 11.5–15.5)
WBC: 9.3 10*3/uL (ref 4.0–10.5)
nRBC: 0 % (ref 0.0–0.2)

## 2021-10-18 LAB — BASIC METABOLIC PANEL
Anion gap: 8 (ref 5–15)
BUN: 54 mg/dL — ABNORMAL HIGH (ref 8–23)
CO2: 18 mmol/L — ABNORMAL LOW (ref 22–32)
Calcium: 8.6 mg/dL — ABNORMAL LOW (ref 8.9–10.3)
Chloride: 105 mmol/L (ref 98–111)
Creatinine, Ser: 4.28 mg/dL — ABNORMAL HIGH (ref 0.44–1.00)
GFR, Estimated: 10 mL/min — ABNORMAL LOW (ref >60)
Glucose, Bld: 144 mg/dL — ABNORMAL HIGH (ref 70–99)
Potassium: 4.4 mmol/L (ref 3.5–5.1)
Sodium: 131 mmol/L — ABNORMAL LOW (ref 135–145)

## 2021-10-18 LAB — MAGNESIUM: Magnesium: 2 mg/dL (ref 1.7–2.4)

## 2021-10-18 LAB — GLUCOSE, CAPILLARY
Glucose-Capillary: 145 mg/dL — ABNORMAL HIGH (ref 70–99)
Glucose-Capillary: 174 mg/dL — ABNORMAL HIGH (ref 70–99)
Glucose-Capillary: 204 mg/dL — ABNORMAL HIGH (ref 70–99)
Glucose-Capillary: 220 mg/dL — ABNORMAL HIGH (ref 70–99)
Glucose-Capillary: 238 mg/dL — ABNORMAL HIGH (ref 70–99)

## 2021-10-18 MED ORDER — POLYETHYLENE GLYCOL 3350 17 G PO PACK
17.0000 g | PACK | Freq: Two times a day (BID) | ORAL | Status: DC | PRN
  Administered 2021-10-22 – 2021-10-29 (×5): 17 g via ORAL
  Filled 2021-10-18 (×6): qty 1

## 2021-10-18 MED ORDER — CLOPIDOGREL BISULFATE 75 MG PO TABS
75.0000 mg | ORAL_TABLET | Freq: Every day | ORAL | Status: DC
  Administered 2021-10-18 – 2021-10-30 (×13): 75 mg via ORAL
  Filled 2021-10-18 (×13): qty 1

## 2021-10-18 MED ORDER — SODIUM CHLORIDE 0.9 % IV SOLN: INTRAVENOUS | Status: DC

## 2021-10-18 MED ORDER — DOCUSATE SODIUM 100 MG PO CAPS
100.0000 mg | ORAL_CAPSULE | Freq: Two times a day (BID) | ORAL | Status: DC | PRN
  Administered 2021-10-20 – 2021-10-30 (×7): 100 mg via ORAL
  Filled 2021-10-18 (×8): qty 1

## 2021-10-18 NOTE — Progress Notes (Signed)
[facility]  ROUNDING NOTE   Subjective:   Patient sitting up in bed Alert and oriented with no family at bedside Tolerating meals Ambulatory in room, denies dizziness or weakness No lower extremity edema  Creatinine 4.25 No urine output recorded  Objective:  Vital signs in last 24 hours:  Temp:  [98.3 F (36.8 C)-98.7 F (37.1 C)] 98.5 F (36.9 C) (10/11 1138) Pulse Rate:  [65-77] 65 (10/11 1138) Resp:  [16-18] 16 (10/11 1138) BP: (88-172)/(48-66) 88/65 (10/11 1138) SpO2:  [95 %-98 %] 96 % (10/11 0754)  Weight change:  Filed Weights   10/11/21 1209 10/12/21 1355 10/16/21 1140  Weight: 84.8 kg 85.1 kg 85.1 kg    Intake/Output: I/O last 3 completed shifts: In: 1062.4 [I.V.:1062.4] Out: -    Intake/Output this shift:  No intake/output data recorded.  Physical Exam: General: NAD  Head: Normocephalic, atraumatic. Moist oral mucosal membranes  Eyes: Anicteric  Lungs:  Clear to auscultation, normal effort  Heart: Regular rate and rhythm  Abdomen:  Soft, nontender  Extremities:  No peripheral edema.  Neurologic: Nonfocal, moving all four extremities  Skin: No lesions  Access: None    Basic Metabolic Panel: Recent Labs  Lab 10/14/21 0900 10/15/21 1014 10/16/21 0434 10/16/21 1439 10/17/21 0329 10/18/21 0900  NA 137 137 135  --  134* 131*  K 4.2 4.4 4.6  --  5.1 4.4  CL 111 109 107  --  104 105  CO2 22 20* 21*  --  18* 18*  GLUCOSE 148* 279* 145*  --  170* 144*  BUN 37* 36* 38* 36* 40* 54*  CREATININE 2.39* 2.32* 2.19* 2.28* 2.98* 4.28*  CALCIUM 8.8* 9.0 8.9  --  8.8* 8.6*  MG  --   --   --   --   --  2.0     Liver Function Tests: No results for input(s): "AST", "ALT", "ALKPHOS", "BILITOT", "PROT", "ALBUMIN" in the last 168 hours. No results for input(s): "LIPASE", "AMYLASE" in the last 168 hours. No results for input(s): "AMMONIA" in the last 168 hours.  CBC: Recent Labs  Lab 10/11/21 1218 10/17/21 0329 10/18/21 0900  WBC 7.7  13.0* 9.3  HGB 11.7* 9.8* 8.8*  HCT 36.6 29.7* 25.8*  MCV 87.8 84.9 84.9  PLT 222 175 155     Cardiac Enzymes: No results for input(s): "CKTOTAL", "CKMB", "CKMBINDEX", "TROPONINI" in the last 168 hours.  BNP: Invalid input(s): "POCBNP"  CBG: Recent Labs  Lab 10/17/21 1211 10/17/21 1530 10/18/21 0027 10/18/21 0822 10/18/21 1141  GLUCAP 152* 260* 220* 145* 61*     Microbiology: Results for orders placed or performed during the hospital encounter of 03/01/21  MRSA Next Gen by PCR, Nasal     Status: None   Collection Time: 03/01/21  4:14 PM   Specimen: Nasal Mucosa; Nasal Swab  Result Value Ref Range Status   MRSA by PCR Next Gen NOT DETECTED NOT DETECTED Final    Comment: (NOTE) The GeneXpert MRSA Assay (FDA approved for NASAL specimens only), is one component of a comprehensive MRSA colonization surveillance program. It is not intended to diagnose MRSA infection nor to guide or monitor treatment for MRSA infections. Test performance is not FDA approved in patients less than 28 years old. Performed at [facility], [address] [place], [place] [address]     Coagulation Studies: No results for input(s): "LABPROT", "INR" in the last 72 hours.   Urinalysis: No results for input(s): "COLORURINE", "LABSPEC", "PHURINE", "GLUCOSEU", "  HGBUR", "BILIRUBINUR", "KETONESUR", "PROTEINUR", "UROBILINOGEN", "NITRITE", "LEUKOCYTESUR" in the last 72 hours.  Invalid input(s): "APPERANCEUR"    Imaging: CT ABDOMEN PELVIS WO CONTRAST  Result Date: 10/17/2021 CLINICAL DATA:  Abdominal pain, acute, nonlocalized EXAM: CT ABDOMEN AND PELVIS WITHOUT CONTRAST TECHNIQUE: Multidetector CT imaging of the abdomen and pelvis was performed following the standard protocol without IV contrast. RADIATION DOSE REDUCTION: This exam was performed according to the departmental dose-optimization program which includes automated exposure control, adjustment of the mA and/or kV according to  patient size and/or use of iterative reconstruction technique. COMPARISON:  CT 02/13/2021 FINDINGS: Lower chest: Small bilateral pleural effusions with adjacent atelectasis. Hepatobiliary: No focal liver abnormality is seen. Vicarious excretion of contrast in the gallbladder. No cholecystitis. Pancreas: Unremarkable. No pancreatic ductal dilatation or surrounding inflammatory changes. Spleen: Normal in size without focal abnormality. Adrenals/Urinary Tract: Adrenal glands are unremarkable. No hydronephrosis. There are hyperdense renal medullary pyramids bilaterally. The left kidney appears hypoattenuating in comparison to the right kidney, and is with new perinephric stranding. The bladder is markedly distended. Stomach/Bowel: Tiny hiatal hernia. There is no evidence of bowel obstruction.Normal appendix. Scattered colonic diverticula. Vascular/Lymphatic: New left renal artery stent. Unchanged ectatic infrarenal abdominal aorta measuring up to 2.6 cm. Unchanged right common iliac artery aneurysm or penetrating ulcer measuring up to 2.9 cm, with new traversing right common iliac artery stent. Reproductive: Unremarkable. Other: Subcutaneous stranding in the lower abdomen anteriorly likely related to medication injection. Musculoskeletal: No acute osseous abnormality. No suspicious osseous lesion. Multilevel degenerative changes of the spine. IMPRESSION: Interval left renal artery stent placement, with new left renal parenchymal hypoattenuation and significant adjacent perinephric stranding. Findings are nonspecific but concerning for left renal infarction versus an infectious/inflammatory process. No hydronephrosis. Retained contrast material in the right kidney and in the medullary pyramids bilaterally, consistent with poor renal function. Unchanged ectatic infrarenal aorta measuring up to 2.6 cm. Unchanged right common iliac artery aneurysm or penetrating ulcer with new iliac artery stent in place. Small bilateral  pleural effusions with adjacent atelectasis. These results will be called to the ordering clinician or representative by the Radiologist Assistant, and communication documented in the PACS or [employer]. Electronically Signed   By: Maurine Simmering M.D.   On: 10/17/2021 10:20   PERIPHERAL VASCULAR CATHETERIZATION  Result Date: 10/16/2021 See surgical note for result.    Medications:    sodium chloride       amLODipine  10 mg Oral Daily   aspirin EC  81 mg Oral Daily   atorvastatin  40 mg Oral QHS   cloNIDine  0.2 mg Oral BID   clopidogrel  75 mg Oral Daily   famotidine  20 mg Oral QHS   heparin  5,000 Units Subcutaneous Q8H   hydrALAZINE  100 mg Oral TID   insulin aspart  0-5 Units Subcutaneous QHS   insulin aspart  0-9 Units Subcutaneous TID WC   metoprolol tartrate  75 mg Oral BID   patiromer  16.8 g Oral Once   acetaminophen, docusate sodium, hydrALAZINE, loratadine, ondansetron (ZOFRAN) IV, ondansetron (ZOFRAN) IV, polyethylene glycol  Assessment/ Plan:  Dawn Ford is a 76 y.o.  female  with past medical conditions including diabetes, CAD, hypertension, bilateral carotid artery stenosis, abdominal aorta and iliac artery aneurysm, hyperlipidemia, and chronic kidney disease stage IIIa, who was admitted to [facility] on 10/11/2021 for Hypertensive crisis [I16.9] Hypertensive urgency [I16.0] Hypertension, unspecified type [I10] Acute renal failure ([place]) [N17.9]   Acute kidney injury on chronic kidney disease  stage IIIa.  Baseline creatinine appears to be 1.1 with GFR 58 on 06/29/2021.  AKI in the setting of malignant hypertension possibly secondary to renal artery stenosis. CT angio abdomen pelvis from February 13, 2021 shows suspected significant narrowing in both renal arteries along with large amount of calcified atherosclerotic plaque within the abdominal aorta. Appreciate vascular surgery placing left renal artery stent yesterday.  Vascular plans to attempt intervention in right  renal artery and 4 to 6 weeks to minimize renal effects.  Creatinine continues to rise.  This is not expected due to recent contrast exposure and hypotension during procedure.  Requesting strict I's and O's to monitor urination.  We will continue normal saline at 125 mL/h in an attempt to rehydrate kidney.  No acute need for dialysis at this time but monitoring closely.   Lab Results  Component Value Date   CREATININE 4.28 (H) 10/18/2021   CREATININE 2.98 (H) 10/17/2021   CREATININE 2.28 (H) 10/16/2021   No intake or output data in the 24 hours ending 10/18/21 1159  2. Hypertensive urgency with chronic kidney disease.  Blood pressure on ED arrival 203/68.  Home regimen includes hydrochlorothiazide, metoprolol, ramipril, and recently added amlodipine.  Currently receiving these medications except ramipril.  Primary team increase clonidine yesterday.  Blood pressure 151/60 today.   3. Anemia of chronic kidney disease  Lab Results  Component Value Date   HGB 8.8 (L) 10/18/2021    Hgb just below desired target.  We will continue to monitor.  4.  Hyperkalemia.  Potassium improved, 4.4.   LOS: 4 Randi Poullard 10/11/202311:59 AM

## 2021-10-18 NOTE — Progress Notes (Signed)
PROGRESS NOTE    DEEPA BARTHEL  IPJ:[id number] DOB: Feb 24, 1945 DOA: 10/11/2021 PCP: Idelle Crouch, MD  [id number]  LOS: 4 days   Brief hospital course:   Assessment & Plan: [name] is a 76 y.o. 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    Patient states that her blood pressure has not been controlled well recently.  She had elevated blood pressure up to SBP 200 yesterday.  Her cardiologist added amlodipine 5 mg to current regimen (including ramipril, HCTZ and metoprolol).     Hypertensive urgency Initially blood pressure 203/68. --cont home amlodipine at increased 10 mg daily -Continue home metoprolol at increased 75 mg bid - oral hydralazine 100 mg 3 times daily -clonidine 0.2 mg bid (dr singh)   Coronary artery disease Chest pain and history of CAD: Her chest pain has resolved.  Troponin negative x2. -Blood pressure control as above -As needed nitroglycerin  -Continue aspirin, Lipitor, Toprol   Bilateral Renal artery stenosis  --10/9--s/p left renal artery stent placement --10/10-- CT abdomen pelvis done without contrast shows perinephric stranding likley due to  mild infarction post procedure causing abdominal pain. -- Discussed with Dr. Lucky Cowboy, recommends continue PRN pain meds and supportive care  Acute renal failure superimposed on stage 3a chronic kidney disease ([place]) Possibly due to dehydration and continuation for ramipril and HCTZ.  And later due to renal infarct during stent placement. --nephro consulted Plan: --MIVF@125  per nephro -Hold ramipril and HCTZ   Hyperlipemia - Lipitor   Type II diabetes mellitus with renal manifestations (HCC) Recent A1c 6.0, well controlled.  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  Cr worsened today.   Objective: Vitals:   10/18/21 0404 10/18/21 0754 10/18/21 1138 10/18/21 1635  BP: (!) 154/53 (!) 151/60 (!) 88/65 (!) 139/50  Pulse: 66 73 65 64  Resp: 18 16 16 16   Temp: 98.3 F (36.8 C) 98.6 F (37 C) 98.5 F (36.9 C) 98 F (36.7 C)  TempSrc: Oral Oral  Oral  SpO2: 95% 96%  97%  Weight:      Height:       No intake or output data in the 24 hours ending 10/18/21 1930 Filed Weights   10/11/21 1209 10/12/21 1355 10/16/21 1140  Weight: 84.8 kg 85.1 kg 85.1 kg    Examination:   Constitutional: NAD, AAOx3 HEENT: conjunctivae and lids normal, EOMI CV: No cyanosis.   RESP: normal respiratory effort, on RA Extremities: No effusions, edema in BLE SKIN: warm, dry Neuro: II - XII grossly intact.   Psych: Normal mood and affect.  Appropriate judgement and reason   Data Reviewed: I have personally reviewed labs and imaging studies  Time spent: 35 minutes  Enzo Bi, MD [facility] If 7PM-7AM, please contact night-coverage 10/18/2021, 7:30 PM

## 2021-10-18 NOTE — Plan of Care (Signed)

## 2021-10-19 ENCOUNTER — Inpatient Hospital Stay: Payer: Medicare Other

## 2021-10-19 DIAGNOSIS — I160 Hypertensive urgency: Secondary | ICD-10-CM | POA: Diagnosis not present

## 2021-10-19 LAB — CBC
HCT: 26 % — ABNORMAL LOW (ref 36.0–46.0)
Hemoglobin: 8.6 g/dL — ABNORMAL LOW (ref 12.0–15.0)
MCH: 28.3 pg (ref 26.0–34.0)
MCHC: 33.1 g/dL (ref 30.0–36.0)
MCV: 85.5 fL (ref 80.0–100.0)
Platelets: 173 10*3/uL (ref 150–400)
RBC: 3.04 MIL/uL — ABNORMAL LOW (ref 3.87–5.11)
RDW: 13.2 % (ref 11.5–15.5)
WBC: 9.3 10*3/uL (ref 4.0–10.5)
nRBC: 0 % (ref 0.0–0.2)

## 2021-10-19 LAB — BASIC METABOLIC PANEL
Anion gap: 10 (ref 5–15)
BUN: 52 mg/dL — ABNORMAL HIGH (ref 8–23)
CO2: 16 mmol/L — ABNORMAL LOW (ref 22–32)
Calcium: 8.5 mg/dL — ABNORMAL LOW (ref 8.9–10.3)
Chloride: 106 mmol/L (ref 98–111)
Creatinine, Ser: 4.21 mg/dL — ABNORMAL HIGH (ref 0.44–1.00)
GFR, Estimated: 10 mL/min — ABNORMAL LOW (ref >60)
Glucose, Bld: 166 mg/dL — ABNORMAL HIGH (ref 70–99)
Potassium: 4.8 mmol/L (ref 3.5–5.1)
Sodium: 132 mmol/L — ABNORMAL LOW (ref 135–145)

## 2021-10-19 LAB — GLUCOSE, CAPILLARY
Glucose-Capillary: 169 mg/dL — ABNORMAL HIGH (ref 70–99)
Glucose-Capillary: 199 mg/dL — ABNORMAL HIGH (ref 70–99)
Glucose-Capillary: 174 mg/dL — ABNORMAL HIGH (ref 70–99)
Glucose-Capillary: 202 mg/dL — ABNORMAL HIGH (ref 70–99)

## 2021-10-19 LAB — MAGNESIUM: Magnesium: 2 mg/dL (ref 1.7–2.4)

## 2021-10-19 MED ORDER — SODIUM BICARBONATE 650 MG PO TABS
1300.0000 mg | ORAL_TABLET | Freq: Two times a day (BID) | ORAL | Status: DC
  Administered 2021-10-19 – 2021-10-25 (×14): 1300 mg via ORAL
  Filled 2021-10-19 (×15): qty 2

## 2021-10-19 MED ORDER — METOPROLOL TARTRATE 50 MG PO TABS
100.0000 mg | ORAL_TABLET | Freq: Two times a day (BID) | ORAL | Status: DC
  Administered 2021-10-19 – 2021-10-26 (×15): 100 mg via ORAL
  Filled 2021-10-19 (×15): qty 2

## 2021-10-19 MED ORDER — FUROSEMIDE 10 MG/ML IJ SOLN
40.0000 mg | Freq: Once | INTRAMUSCULAR | Status: AC
  Administered 2021-10-19: 40 mg via INTRAVENOUS
  Filled 2021-10-19: qty 4

## 2021-10-19 MED ORDER — CLONIDINE HCL 0.1 MG PO TABS
0.2000 mg | ORAL_TABLET | Freq: Three times a day (TID) | ORAL | Status: DC
  Administered 2021-10-19 – 2021-10-24 (×14): 0.2 mg via ORAL
  Filled 2021-10-19 (×15): qty 2

## 2021-10-19 NOTE — Plan of Care (Signed)
  Problem: Skin Integrity: Goal: Risk for impaired skin integrity will decrease Outcome: Progressing   Problem: Activity: Goal: Risk for activity intolerance will decrease Outcome: Progressing   Problem: Pain Managment: Goal: General experience of comfort will improve Outcome: Progressing   Problem: Safety: Goal: Ability to remain free from injury will improve Outcome: Progressing   

## 2021-10-19 NOTE — Progress Notes (Signed)
[facility]  ROUNDING NOTE   Subjective:   Patient ill appearing today States she feels short of breath Mild lower extremity edema  Creatinine 4.21 UOP 675ml on night shift  Objective:  Vital signs in last 24 hours:  Temp:  [97.9 F (36.6 C)-99 F (37.2 C)] 98.2 F (36.8 C) (10/12 1144) Pulse Rate:  [61-78] 61 (10/12 1144) Resp:  [16-19] 16 (10/12 1144) BP: (139-193)/(50-68) 175/65 (10/12 1144) SpO2:  [92 %-97 %] 95 % (10/12 1144)  Weight change:  Filed Weights   10/11/21 1209 10/12/21 1355 10/16/21 1140  Weight: 84.8 kg 85.1 kg 85.1 kg    Intake/Output: I/O last 3 completed shifts: In: -  Out: 600 [Urine:600]   Intake/Output this shift:  Total I/O In: 2454.1 [I.V.:2454.1] Out: -   Physical Exam: General: Ill appearing  Head: Normocephalic, atraumatic. Moist oral mucosal membranes  Eyes: Anicteric  Lungs:  Diminished in bases, room air  Heart: Regular rate and rhythm  Abdomen:  Soft, nontender  Extremities:  trace peripheral edema.  Neurologic: Nonfocal, moving all four extremities  Skin: No lesions  Access: None    Basic Metabolic Panel: Recent Labs  Lab 10/15/21 1014 10/16/21 0434 10/16/21 1439 10/17/21 0329 10/18/21 0900 10/19/21 0452  NA 137 135  --  134* 131* 132*  K 4.4 4.6  --  5.1 4.4 4.8  CL 109 107  --  104 105 106  CO2 20* 21*  --  18* 18* 16*  GLUCOSE 279* 145*  --  170* 144* 166*  BUN 36* 38* 36* 40* 54* 52*  CREATININE 2.32* 2.19* 2.28* 2.98* 4.28* 4.21*  CALCIUM 9.0 8.9  --  8.8* 8.6* 8.5*  MG  --   --   --   --  2.0 2.0     Liver Function Tests: No results for input(s): "AST", "ALT", "ALKPHOS", "BILITOT", "PROT", "ALBUMIN" in the last 168 hours. No results for input(s): "LIPASE", "AMYLASE" in the last 168 hours. No results for input(s): "AMMONIA" in the last 168 hours.  CBC: Recent Labs  Lab 10/17/21 0329 10/18/21 0900 10/19/21 0452  WBC 13.0* 9.3 9.3  HGB 9.8* 8.8* 8.6*  HCT 29.7* 25.8* 26.0*  MCV  84.9 84.9 85.5  PLT 175 155 173     Cardiac Enzymes: No results for input(s): "CKTOTAL", "CKMB", "CKMBINDEX", "TROPONINI" in the last 168 hours.  BNP: Invalid input(s): "POCBNP"  CBG: Recent Labs  Lab 10/18/21 1141 10/18/21 1636 10/18/21 2114 10/19/21 0803 10/19/21 1140  GLUCAP 238* 204* 174* 169* 199*     Microbiology: Results for orders placed or performed during the hospital encounter of 03/01/21  MRSA Next Gen by PCR, Nasal     Status: None   Collection Time: 03/01/21  4:14 PM   Specimen: Nasal Mucosa; Nasal Swab  Result Value Ref Range Status   MRSA by PCR Next Gen NOT DETECTED NOT DETECTED Final    Comment: (NOTE) The GeneXpert MRSA Assay (FDA approved for NASAL specimens only), is one component of a comprehensive MRSA colonization surveillance program. It is not intended to diagnose MRSA infection nor to guide or monitor treatment for MRSA infections. Test performance is not FDA approved in patients less than 81 years old. Performed at [facility], [address] [place],  [address]     Coagulation Studies: No results for input(s): "LABPROT", "INR" in the last 72 hours.   Urinalysis: No results for input(s): "COLORURINE", "LABSPEC", "PHURINE", "GLUCOSEU", "HGBUR", "BILIRUBINUR", "KETONESUR", "PROTEINUR", "UROBILINOGEN", "NITRITE", "LEUKOCYTESUR" in  the last 72 hours.  Invalid input(s): "APPERANCEUR"    Imaging: DG Chest Port 1 View  Result Date: 10/19/2021 CLINICAL DATA:  Shortness of breath EXAM: PORTABLE CHEST 1 VIEW COMPARISON:  Previous studies including the examination of 10/11/2021 FINDINGS: Transverse diameter of heart is slightly increased. Central pulmonary vessels are more prominent. Increased interstitial markings are seen in parahilar regions and lower lung fields. Small bilateral pleural effusions are seen. There is no pneumothorax. IMPRESSION: There is prominence of central pulmonary vessels along with interval increase in  interstitial markings suggesting CHF with pulmonary edema. Possibility of underlying pneumonia is not excluded. Interval appearance of small bilateral pleural effusions. Electronically Signed   By: Elmer Picker M.D.   On: 10/19/2021 09:42     Medications:       amLODipine  10 mg Oral Daily   aspirin EC  81 mg Oral Daily   atorvastatin  40 mg Oral QHS   cloNIDine  0.2 mg Oral BID   clopidogrel  75 mg Oral Daily   famotidine  20 mg Oral QHS   furosemide  40 mg Intravenous Once   heparin  5,000 Units Subcutaneous Q8H   hydrALAZINE  100 mg Oral TID   insulin aspart  0-5 Units Subcutaneous QHS   insulin aspart  0-9 Units Subcutaneous TID WC   metoprolol tartrate  75 mg Oral BID   patiromer  16.8 g Oral Once   sodium bicarbonate  1,300 mg Oral BID   acetaminophen, docusate sodium, hydrALAZINE, loratadine, ondansetron (ZOFRAN) IV, ondansetron (ZOFRAN) IV, polyethylene glycol  Assessment/ Plan:  Dawn Ford is a 76 y.o.  female  with past medical conditions including diabetes, CAD, hypertension, bilateral carotid artery stenosis, abdominal aorta and iliac artery aneurysm, hyperlipidemia, and chronic kidney disease stage IIIa, who was admitted to [facility] on 10/11/2021 for Hypertensive crisis [I16.9] Hypertensive urgency [I16.0] Hypertension, unspecified type [I10] Acute renal failure ([place]) [N17.9]   Acute kidney injury on chronic kidney disease stage IIIa.  Baseline creatinine appears to be 1.1 with GFR 58 on 06/29/2021.  AKI in the setting of malignant hypertension possibly secondary to renal artery stenosis. CT angio abdomen pelvis from February 13, 2021 shows suspected significant narrowing in both renal arteries along with large amount of calcified atherosclerotic plaque within the abdominal aorta. Appreciate vascular surgery placing left renal artery stent yesterday.  Vascular plans to attempt intervention in right renal artery and 4 to 6 weeks to minimize renal effects.   Creatinine plateaued today. Decent urine output recorded on night shift. Due to shortness of breath, will stop IVF and ordered chest Xray. Chest xray shows pulmonary edema, will order Furosemide 40mg  IV once. Will continue to monitor.   Lab Results  Component Value Date   CREATININE 4.21 (H) 10/19/2021   CREATININE 4.28 (H) 10/18/2021   CREATININE 2.98 (H) 10/17/2021    Intake/Output Summary (Last 24 hours) at 10/19/2021 1333 Last data filed at 10/19/2021 0927 Gross per 24 hour  Intake 2454.13 ml  Output 600 ml  Net 1854.13 ml    2. Hypertensive urgency with chronic kidney disease.  Blood pressure on ED arrival 203/68.  Home regimen includes hydrochlorothiazide, metoprolol, ramipril, and recently added amlodipine.  Currently receiving these medications except ramipril. Also receiving Clonidine.  Blood pressure elevated today, 193/57.   3. Anemia of chronic kidney disease  Lab Results  Component Value Date   HGB 8.6 (L) 10/19/2021    Hgb below target but stable.  4.  Hyperkalemia.  Potassium improved, 4.8.   LOS: [place] 10/12/20231:33 PM

## 2021-10-19 NOTE — Progress Notes (Signed)
PROGRESS NOTE    Dawn Ford  UKG:[id number] DOB: 10-Sep-1945 DOA: 10/11/2021 PCP: Idelle Crouch, MD  [id number]  LOS: 5 days   Brief hospital course:   Assessment & Plan: Dawn Ford is a 76 y.o. 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    Patient states that her blood pressure has not been controlled well recently.  She had elevated blood pressure up to SBP 200 yesterday.  Her cardiologist added amlodipine 5 mg to current regimen (including ramipril, HCTZ and metoprolol).     Hypertensive urgency Initially blood pressure 203/68.  BP meds increased but BP still not controlled. --cont home amlodipine at increased 10 mg daily -increase Lopressor to 100 mg BID --cont oral hydralazine 100 mg 3 times daily --increase clonidine 0.2 mg from BID to TID   Bilateral Renal artery stenosis  --10/9--s/p left renal artery stent placement --10/10-- CT abdomen pelvis done without contrast shows perinephric stranding likley due to  mild infarction post procedure causing abdominal pain. -- Discussed with Dr. Lucky Cowboy, recommends continue PRN pain meds and supportive care  Acute renal failure superimposed on stage 3a chronic kidney disease ([place]) Possibly due to dehydration and continuation for ramipril and HCTZ.  And later due to renal infarct during stent placement. --nephro consulted Plan: --d/c MIVF today -Hold ramipril and HCTZ  Metabolic acidosis --due to kidney failure --start oral bicarb today  Respiratory distress 2/2 pulm edema from fluid overload --CXR today showed pulm edema, 2/2 fluid overload from MIVF given for AKI --IV lasix 40 mg x1 today, per nephro --supplemental O2 to keep sats >=90%  Coronary artery disease Chest pain and history of CAD: Her chest pain has resolved.  Troponin negative x2. -Blood pressure control as  above -As needed nitroglycerin  -Continue aspirin, Lipitor, Toprol  Hyperlipemia - Lipitor   Type II diabetes mellitus with renal manifestations (HCC) Recent A1c 6.0, well controlled.  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  CXR showed new pulm edema.   Objective: Vitals:   10/19/21 1144 10/19/21 1340 10/19/21 1643 10/19/21 1800  BP: (!) 175/65 (!) 147/57 (!) 172/69 (!) 177/60  Pulse: 61 61 67 71  Resp: 16  20   Temp: 98.2 F (36.8 C)  97.9 F (36.6 C)   TempSrc:   Oral   SpO2: 95%  95%   Weight:      Height:        Intake/Output Summary (Last 24 hours) at 10/19/2021 1946 Last data filed at 10/19/2021 1800 Gross per 24 hour  Intake 2529.13 ml  Output 1700 ml  Net 829.13 ml   Filed Weights   10/11/21 1209 10/12/21 1355 10/16/21 1140  Weight: 84.8 kg 85.1 kg 85.1 kg    Examination:   Constitutional: NAD, AAOx3  HEENT: conjunctivae and lids normal, EOMI CV: No cyanosis.   RESP: normal respiratory effort, on 2L Extremities: No effusions, edema in BLE SKIN: warm, dry Neuro: II - XII grossly intact.   Psych: depressed mood and affect.     Data Reviewed: I have personally reviewed labs and imaging studies  Time spent: 50 minutes  Enzo Bi, MD [facility] If 7PM-7AM, please contact night-coverage 10/19/2021, 7:46 PM

## 2021-10-20 DIAGNOSIS — I160 Hypertensive urgency: Secondary | ICD-10-CM | POA: Diagnosis not present

## 2021-10-20 LAB — BASIC METABOLIC PANEL
Anion gap: 11 (ref 5–15)
BUN: 61 mg/dL — ABNORMAL HIGH (ref 8–23)
CO2: 18 mmol/L — ABNORMAL LOW (ref 22–32)
Calcium: 8.8 mg/dL — ABNORMAL LOW (ref 8.9–10.3)
Chloride: 104 mmol/L (ref 98–111)
Creatinine, Ser: 4.93 mg/dL — ABNORMAL HIGH (ref 0.44–1.00)
GFR, Estimated: 9 mL/min — ABNORMAL LOW (ref >60)
Glucose, Bld: 156 mg/dL — ABNORMAL HIGH (ref 70–99)
Potassium: 4.6 mmol/L (ref 3.5–5.1)
Sodium: 133 mmol/L — ABNORMAL LOW (ref 135–145)

## 2021-10-20 LAB — CBC
HCT: 26.7 % — ABNORMAL LOW (ref 36.0–46.0)
Hemoglobin: 9 g/dL — ABNORMAL LOW (ref 12.0–15.0)
MCH: 28.6 pg (ref 26.0–34.0)
MCHC: 33.7 g/dL (ref 30.0–36.0)
MCV: 84.8 fL (ref 80.0–100.0)
Platelets: 190 10*3/uL (ref 150–400)
RBC: 3.15 MIL/uL — ABNORMAL LOW (ref 3.87–5.11)
RDW: 13.2 % (ref 11.5–15.5)
WBC: 8.6 10*3/uL (ref 4.0–10.5)
nRBC: 0 % (ref 0.0–0.2)

## 2021-10-20 LAB — GLUCOSE, CAPILLARY
Glucose-Capillary: 160 mg/dL — ABNORMAL HIGH (ref 70–99)
Glucose-Capillary: 198 mg/dL — ABNORMAL HIGH (ref 70–99)
Glucose-Capillary: 217 mg/dL — ABNORMAL HIGH (ref 70–99)
Glucose-Capillary: 238 mg/dL — ABNORMAL HIGH (ref 70–99)
Glucose-Capillary: 241 mg/dL — ABNORMAL HIGH (ref 70–99)

## 2021-10-20 LAB — MAGNESIUM: Magnesium: 2.2 mg/dL (ref 1.7–2.4)

## 2021-10-20 NOTE — Progress Notes (Signed)
[facility]  ROUNDING NOTE   Subjective:   Patient seen resting comfortably, alert and oriented Denies shortness of breath Lower extremity edema resolved  Creatinine 4.93 Urine output 1.6 L recorded in previous 24 hours  Objective:  Vital signs in last 24 hours:  Temp:  [97.9 F (36.6 C)-99 F (37.2 C)] 98 F (36.7 C) (10/13 0745) Pulse Rate:  [61-76] 76 (10/13 0745) Resp:  [16-21] 18 (10/13 0745) BP: (126-177)/(44-77) 176/62 (10/13 0745) SpO2:  [95 %-97 %] 95 % (10/13 0745)  Weight change:  Filed Weights   10/11/21 1209 10/12/21 1355 10/16/21 1140  Weight: 84.8 kg 85.1 kg 85.1 kg    Intake/Output: I/O last 3 completed shifts: In: 2529.1 [P.O.:75; I.V.:2454.1] Out: 2200 [Urine:2200]   Intake/Output this shift:  No intake/output data recorded.  Physical Exam: General: NAD  Head: Normocephalic, atraumatic. Moist oral mucosal membranes  Eyes: Anicteric  Lungs:  Clear to auscultation, normal effort  Heart: Regular rate and rhythm  Abdomen:  Soft, nontender  Extremities:  No peripheral edema.  Neurologic: Nonfocal, moving all four extremities  Skin: No lesions  Access: None    Basic Metabolic Panel: Recent Labs  Lab 10/16/21 0434 10/16/21 1439 10/17/21 0329 10/18/21 0900 10/19/21 0452 10/20/21 0540  NA 135  --  134* 131* 132* 133*  K 4.6  --  5.1 4.4 4.8 4.6  CL 107  --  104 105 106 104  CO2 21*  --  18* 18* 16* 18*  GLUCOSE 145*  --  170* 144* 166* 156*  BUN 38* 36* 40* 54* 52* 61*  CREATININE 2.19* 2.28* 2.98* 4.28* 4.21* 4.93*  CALCIUM 8.9  --  8.8* 8.6* 8.5* 8.8*  MG  --   --   --  2.0 2.0 2.2     Liver Function Tests: No results for input(s): "AST", "ALT", "ALKPHOS", "BILITOT", "PROT", "ALBUMIN" in the last 168 hours. No results for input(s): "LIPASE", "AMYLASE" in the last 168 hours. No results for input(s): "AMMONIA" in the last 168 hours.  CBC: Recent Labs  Lab 10/17/21 0329 10/18/21 0900 10/19/21 0452 10/20/21 0540   WBC 13.0* 9.3 9.3 8.6  HGB 9.8* 8.8* 8.6* 9.0*  HCT 29.7* 25.8* 26.0* 26.7*  MCV 84.9 84.9 85.5 84.8  PLT 175 155 173 190     Cardiac Enzymes: No results for input(s): "CKTOTAL", "CKMB", "CKMBINDEX", "TROPONINI" in the last 168 hours.  BNP: Invalid input(s): "POCBNP"  CBG: Recent Labs  Lab 10/19/21 0803 10/19/21 1140 10/19/21 1639 10/19/21 2133 10/20/21 0746  GLUCAP 169* 199* 174* 202* 160*     Microbiology: Results for orders placed or performed during the hospital encounter of 03/01/21  MRSA Next Gen by PCR, Nasal     Status: None   Collection Time: 03/01/21  4:14 PM   Specimen: Nasal Mucosa; Nasal Swab  Result Value Ref Range Status   MRSA by PCR Next Gen NOT DETECTED NOT DETECTED Final    Comment: (NOTE) The GeneXpert MRSA Assay (FDA approved for NASAL specimens only), is one component of a comprehensive MRSA colonization surveillance program. It is not intended to diagnose MRSA infection nor to guide or monitor treatment for MRSA infections. Test performance is not FDA approved in patients less than 30 years old. Performed at [facility], [address] [place], [place] [address]     Coagulation Studies: No results for input(s): "LABPROT", "INR" in the last 72 hours.   Urinalysis: No results for input(s): "COLORURINE", "LABSPEC", "PHURINE", "GLUCOSEU", "HGBUR", "BILIRUBINUR", "  KETONESUR", "PROTEINUR", "UROBILINOGEN", "NITRITE", "LEUKOCYTESUR" in the last 72 hours.  Invalid input(s): "APPERANCEUR"    Imaging: DG Chest Port 1 View  Result Date: 10/19/2021 CLINICAL DATA:  Shortness of breath EXAM: PORTABLE CHEST 1 VIEW COMPARISON:  Previous studies including the examination of 10/11/2021 FINDINGS: Transverse diameter of heart is slightly increased. Central pulmonary vessels are more prominent. Increased interstitial markings are seen in parahilar regions and lower lung fields. Small bilateral pleural effusions are seen. There is no  pneumothorax. IMPRESSION: There is prominence of central pulmonary vessels along with interval increase in interstitial markings suggesting CHF with pulmonary edema. Possibility of underlying pneumonia is not excluded. Interval appearance of small bilateral pleural effusions. Electronically Signed   By: Elmer Picker M.D.   On: 10/19/2021 09:42     Medications:       amLODipine  10 mg Oral Daily   aspirin EC  81 mg Oral Daily   atorvastatin  40 mg Oral QHS   cloNIDine  0.2 mg Oral TID   clopidogrel  75 mg Oral Daily   famotidine  20 mg Oral QHS   heparin  5,000 Units Subcutaneous Q8H   hydrALAZINE  100 mg Oral TID   insulin aspart  0-5 Units Subcutaneous QHS   insulin aspart  0-9 Units Subcutaneous TID WC   metoprolol tartrate  100 mg Oral BID   patiromer  16.8 g Oral Once   sodium bicarbonate  1,300 mg Oral BID   acetaminophen, docusate sodium, hydrALAZINE, loratadine, ondansetron (ZOFRAN) IV, ondansetron (ZOFRAN) IV, polyethylene glycol  Assessment/ Plan:  Ms. CARALINE DEUTSCHMAN is a 76 y.o.  female  with past medical conditions including diabetes, CAD, hypertension, bilateral carotid artery stenosis, abdominal aorta and iliac artery aneurysm, hyperlipidemia, and chronic kidney disease stage IIIa, who was admitted to [facility] on 10/11/2021 for Hypertensive crisis [I16.9] Hypertensive urgency [I16.0] Hypertension, unspecified type [I10] Acute renal failure ([place]) [N17.9]   Acute kidney injury on chronic kidney disease stage IIIa.  Baseline creatinine appears to be 1.1 with GFR 58 on 06/29/2021.  AKI in the setting of malignant hypertension possibly secondary to renal artery stenosis. CT angio abdomen pelvis from February 13, 2021 shows suspected significant narrowing in both renal arteries along with large amount of calcified atherosclerotic plaque within the abdominal aorta. Appreciate vascular surgery placing left renal artery stent yesterday.  Vascular plans to attempt intervention in  right renal artery and 4 to 6 weeks to minimize renal effects.  Creatinine remains elevated however adequate urine output recorded.  Patient did receive IV furosemide yesterday for shortness of breath.  Urine output recorded of 1.6 L.  Patient denies respiratory symptoms.  No acute need for dialysis however discussed with patient that if renal function does not improve in the next day or 2, may need to consider temporary dialysis.  We will continue to monitor closely  Lab Results  Component Value Date   CREATININE 4.93 (H) 10/20/2021   CREATININE 4.21 (H) 10/19/2021   CREATININE 4.28 (H) 10/18/2021    Intake/Output Summary (Last 24 hours) at 10/20/2021 1025 Last data filed at 10/20/2021 0500 Gross per 24 hour  Intake 50 ml  Output 1600 ml  Net -1550 ml    2. Hypertensive urgency with chronic kidney disease.  Blood pressure on ED arrival 203/68.  Home regimen includes hydrochlorothiazide, metoprolol, ramipril, and recently added amlodipine.  Ramipril currently held.  Patient also receiving clonidine.  Blood pressure elevated today, 176/62.   3. Anemia of chronic kidney disease  Lab Results  Component Value Date   HGB 9.0 (L) 10/20/2021    Hemoglobin stable.  4.  Hyperkalemia.  Potassium stable   LOS: 6 Jasaun Carn 10/13/202310:25 AM

## 2021-10-20 NOTE — Care Management Important Message (Signed)
Important Message  Patient Details  Name: Dawn Ford MRN: [id number] Date of Birth: 09/04/1945   Medicare Important Message Given:  Yes     [name] 10/20/2021, 1:52 PM

## 2021-10-20 NOTE — Progress Notes (Signed)
PROGRESS NOTE    Dawn Ford  WUJ:[id number] DOB: 1945/06/14 DOA: 10/11/2021 PCP: Dawn Crouch, MD  118A/[id number]-AA  LOS: 6 days   Brief hospital course:   Assessment & Plan: Dawn Ford is a 76 y.o. 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    Patient states that her blood pressure has not been controlled well recently.  She had elevated blood pressure up to SBP 200 yesterday.  Her cardiologist added amlodipine 5 mg to current regimen (including ramipril, HCTZ and metoprolol).     Hypertensive urgency Initially blood pressure 203/68.  BP meds increased but BP still not controlled.  Likely due to renal artery stenosis. --cont home amlodipine at increased 10 mg daily --cont Lopressor at increased 100 mg BID --cont oral hydralazine 100 mg 3 times daily --cont clonidine 0.2 mg at increased TID   Bilateral Renal artery stenosis  --10/9--s/p left renal artery stent placement --10/10-- CT abdomen pelvis done without contrast shows perinephric stranding likley due to  mild infarction post procedure causing abdominal pain. -- Discussed with Dr. Lucky Ford, recommends continue PRN pain meds and supportive care  Acute renal failure superimposed on stage 3a chronic kidney disease (Dawn Ford) Possibly due to dehydration and continuation for ramipril and HCTZ.  And later due to renal infarct during stent placement. --nephro consulted Plan: --hold MIVF for now --Hold ramipril and HCTZ --monitor Cr for recovery  Metabolic acidosis --due to kidney failure --cont oral bicarb (new)   Respiratory distress 2/2 pulm edema from fluid overload --CXR 10/12 showed pulm edema, 2/2 fluid overload from MIVF given for AKI --IV lasix 40 mg x1 with good urine output and improvement  Coronary artery disease Chest pain and history of CAD: Her chest pain has  resolved.  Troponin negative x2. -Blood pressure control as above -As needed nitroglycerin  -Continue aspirin, Lipitor, Toprol  Hyperlipemia - Lipitor   Type II diabetes mellitus with renal manifestations (HCC) Recent A1c 6.0, well controlled.  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  Pt is off O2.  Pt reported feeling bad, however, seemed mostly mood related.   Objective: Vitals:   10/20/21 0600 10/20/21 0745 10/20/21 1217 10/20/21 1712  BP: (!) 159/44 (!) 176/62 (!) 142/54 (!) 168/63  Pulse: 73 76 (!) 59 66  Resp: (!) 21 18  18   Temp: 98.3 F (36.8 C) 98 F (36.7 C) 97.9 F (36.6 C) 98.6 F (37 C)  TempSrc:      SpO2: 97% 95% 94% 94%  Weight:      Height:        Intake/Output Summary (Last 24 hours) at 10/20/2021 1943 Last data filed at 10/20/2021 0500 Gross per 24 hour  Intake --  Output 500 ml  Net -500 ml   Filed Weights   10/11/21 1209 10/12/21  1355 10/16/21 1140  Weight: 84.8 kg 85.1 kg 85.1 kg    Examination:   Constitutional: NAD, AAOx3 HEENT: conjunctivae and lids normal, EOMI CV: No cyanosis.   RESP: normal respiratory effort, on RA Neuro: II - XII grossly intact.   Psych: depressed mood and affect.  Appropriate judgement and reason   Data Reviewed: I have personally reviewed labs and imaging studies  Time spent: 35 minutes  Dawn Bi, MD [facility] If 7PM-7AM,  please contact night-coverage 10/20/2021, 7:43 PM

## 2021-10-21 ENCOUNTER — Inpatient Hospital Stay: Payer: Medicare Other

## 2021-10-21 DIAGNOSIS — I160 Hypertensive urgency: Secondary | ICD-10-CM | POA: Diagnosis not present

## 2021-10-21 LAB — BASIC METABOLIC PANEL
Anion gap: 11 (ref 5–15)
BUN: 70 mg/dL — ABNORMAL HIGH (ref 8–23)
CO2: 17 mmol/L — ABNORMAL LOW (ref 22–32)
Calcium: 8.4 mg/dL — ABNORMAL LOW (ref 8.9–10.3)
Chloride: 100 mmol/L (ref 98–111)
Creatinine, Ser: 5.37 mg/dL — ABNORMAL HIGH (ref 0.44–1.00)
GFR, Estimated: 8 mL/min — ABNORMAL LOW (ref >60)
Glucose, Bld: 169 mg/dL — ABNORMAL HIGH (ref 70–99)
Potassium: 4.6 mmol/L (ref 3.5–5.1)
Sodium: 128 mmol/L — ABNORMAL LOW (ref 135–145)

## 2021-10-21 LAB — HEPATITIS B CORE ANTIBODY, TOTAL: Hep B Core Total Ab: REACTIVE — AB

## 2021-10-21 LAB — CBC
HCT: 24.5 % — ABNORMAL LOW (ref 36.0–46.0)
HCT: 24.4 % — ABNORMAL LOW (ref 36.0–46.0)
Hemoglobin: 8.4 g/dL — ABNORMAL LOW (ref 12.0–15.0)
Hemoglobin: 8.4 g/dL — ABNORMAL LOW (ref 12.0–15.0)
MCH: 28.3 pg (ref 26.0–34.0)
MCH: 28.1 pg (ref 26.0–34.0)
MCHC: 34.3 g/dL (ref 30.0–36.0)
MCHC: 34.4 g/dL (ref 30.0–36.0)
MCV: 82.5 fL (ref 80.0–100.0)
MCV: 81.6 fL (ref 80.0–100.0)
Platelets: 198 10*3/uL (ref 150–400)
Platelets: 181 10*3/uL (ref 150–400)
RBC: 2.97 MIL/uL — ABNORMAL LOW (ref 3.87–5.11)
RBC: 2.99 MIL/uL — ABNORMAL LOW (ref 3.87–5.11)
RDW: 12.6 % (ref 11.5–15.5)
RDW: 12.6 % (ref 11.5–15.5)
WBC: 8.2 10*3/uL (ref 4.0–10.5)
WBC: 12 10*3/uL — ABNORMAL HIGH (ref 4.0–10.5)
nRBC: 0 % (ref 0.0–0.2)
nRBC: 0 % (ref 0.0–0.2)

## 2021-10-21 LAB — RENAL FUNCTION PANEL
Albumin: 3.1 g/dL — ABNORMAL LOW (ref 3.5–5.0)
Anion gap: 11 (ref 5–15)
BUN: 46 mg/dL — ABNORMAL HIGH (ref 8–23)
CO2: 24 mmol/L (ref 22–32)
Calcium: 8.2 mg/dL — ABNORMAL LOW (ref 8.9–10.3)
Chloride: 96 mmol/L — ABNORMAL LOW (ref 98–111)
Creatinine, Ser: 3.38 mg/dL — ABNORMAL HIGH (ref 0.44–1.00)
GFR, Estimated: 13 mL/min — ABNORMAL LOW (ref >60)
Glucose, Bld: 129 mg/dL — ABNORMAL HIGH (ref 70–99)
Phosphorus: 2.7 mg/dL (ref 2.5–4.6)
Potassium: 3.8 mmol/L (ref 3.5–5.1)
Sodium: 131 mmol/L — ABNORMAL LOW (ref 135–145)

## 2021-10-21 LAB — HEPATITIS B SURFACE ANTIBODY,QUALITATIVE: Hep B S Ab: REACTIVE — AB

## 2021-10-21 LAB — MAGNESIUM: Magnesium: 2.2 mg/dL (ref 1.7–2.4)

## 2021-10-21 LAB — HEPATITIS C ANTIBODY: HCV Ab: NONREACTIVE

## 2021-10-21 LAB — HEPATITIS B SURFACE ANTIGEN: Hepatitis B Surface Ag: NONREACTIVE

## 2021-10-21 LAB — GLUCOSE, CAPILLARY
Glucose-Capillary: 169 mg/dL — ABNORMAL HIGH (ref 70–99)
Glucose-Capillary: 261 mg/dL — ABNORMAL HIGH (ref 70–99)
Glucose-Capillary: 229 mg/dL — ABNORMAL HIGH (ref 70–99)
Glucose-Capillary: 139 mg/dL — ABNORMAL HIGH (ref 70–99)

## 2021-10-21 MED ORDER — ALTEPLASE 2 MG IJ SOLR: 2.0000 mg | Freq: Once | INTRAMUSCULAR | Status: DC | PRN

## 2021-10-21 MED ORDER — LIDOCAINE HCL (PF) 1 % IJ SOLN: 5.0000 mL | INTRAMUSCULAR | Status: DC | PRN

## 2021-10-21 MED ORDER — PENTAFLUOROPROP-TETRAFLUOROETH EX AERO: 1.0000 | INHALATION_SPRAY | CUTANEOUS | Status: DC | PRN

## 2021-10-21 MED ORDER — ANTICOAGULANT SODIUM CITRATE 4% (200MG/5ML) IV SOLN: 5.0000 mL | Status: DC | PRN

## 2021-10-21 MED ORDER — LIDOCAINE-PRILOCAINE 2.5-2.5 % EX CREA: 1.0000 | TOPICAL_CREAM | CUTANEOUS | Status: DC | PRN

## 2021-10-21 MED ORDER — CHLORHEXIDINE GLUCONATE CLOTH 2 % EX PADS
6.0000 | MEDICATED_PAD | Freq: Every day | CUTANEOUS | Status: DC
  Administered 2021-10-22 – 2021-10-30 (×8): 6 via TOPICAL

## 2021-10-21 MED ORDER — HEPARIN SODIUM (PORCINE) 1000 UNIT/ML DIALYSIS: 1000.0000 [IU] | INTRAMUSCULAR | Status: DC | PRN

## 2021-10-21 NOTE — Progress Notes (Signed)
Mobility Specialist - Progress Note   10/21/21 0927  Mobility  Activity Ambulated with assistance in room;Ambulated with assistance to bathroom;Stood at bedside;Dangled on edge of bed  Level of Assistance Contact guard assist, steadying assist  Assistive Device None  Distance Ambulated (ft) 40 ft  Activity Response Tolerated well  $Mobility charge 1 Mobility   Pt EOB on 2L upon arrival. Pt STS CGA and ambulates to restroom and within room CGA-SBA. Pt has no LOB during session but slightly unsteady. Pt left in chair with needs in reach.   Gretchen Short  Mobility Specialist  10/21/21 9:30 AM

## 2021-10-21 NOTE — Progress Notes (Signed)
PROGRESS NOTE    Dawn Ford  VOJ:[id number] DOB: 1945/10/08 DOA: 10/11/2021 PCP: Idelle Crouch, MD  118A/118A-AA  LOS: 7 days   Brief hospital course:   Assessment & Plan: Dawn Ford is a 76 y.o. 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    Patient states that her blood pressure has not been controlled well recently.  She had elevated blood pressure up to SBP 200 yesterday.  Her cardiologist added amlodipine 5 mg to current regimen (including ramipril, HCTZ and metoprolol).     Hypertensive urgency Initially blood pressure 203/68.  BP meds increased but BP still not controlled.  Likely due to renal artery stenosis. --cont home amlodipine at increased 10 mg daily --cont Lopressor at increased 100 mg BID --cont oral hydralazine 100 mg 3 times daily --cont clonidine 0.2 mg at increased TID   Bilateral Renal artery stenosis  --10/9--s/p left renal artery stent placement --10/10-- CT abdomen pelvis done without contrast shows perinephric stranding likley due to  mild infarction post procedure causing abdominal pain. -- Discussed with Dr. Lucky Cowboy, recommends continue PRN pain meds and supportive care  Acute renal failure superimposed on stage 3a chronic kidney disease ([place]) Possibly due to dehydration and continuation for ramipril and HCTZ.  And later due to renal infarct during stent placement. --nephro consulted Plan: --hold MIVF for now --Hold ramipril and HCTZ --start temp dialysis today  Metabolic acidosis --due to kidney failure --cont oral bicarb (new)   Respiratory distress 2/2 pulm edema from fluid overload --CXR 10/12 showed pulm edema, 2/2 fluid overload from MIVF given for AKI --IV lasix 40 mg x1 on 10/12  Coronary artery disease Chest pain and history of CAD: Her chest pain has resolved.  Troponin negative x2.  -Blood pressure control as above -As needed nitroglycerin  -Continue aspirin, Lipitor, Toprol  Hyperlipemia - Lipitor   Type II diabetes mellitus with renal manifestations (HCC) Recent A1c 6.0, well controlled.  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     Objective: Vitals:   10/21/21 1830 10/21/21 1900 10/21/21 1921 10/21/21 1927  BP: (!) 151/55 (!) 171/54 (!) 164/57 (!) 172/62  Pulse: 63 67 65 70  Resp: 13 14 15 12   Temp:   98 F (36.7 C)   TempSrc:   Oral   SpO2: 96% 97% 97% 96%  Weight:      Height:        Intake/Output Summary (Last 24 hours) at 10/21/2021 2032 Last data filed at 10/21/2021 1921 Gross per 24 hour  Intake 480 ml  Output 900 ml  Net -420 ml   Filed Weights   10/11/21 1209 10/12/21 1355 10/16/21 1140  Weight: 84.8 kg 85.1 kg 85.1 kg    Examination:  Constitutional: NAD, AAOx3 HEENT: conjunctivae and lids normal, EOMI CV: No cyanosis.   RESP: normal respiratory effort, on 2L Neuro: II - XII grossly intact.   Psych: depressed mood and affect.  Appropriate judgement and reason   Data Reviewed: I have personally reviewed labs and imaging studies  Time spent: 35 minutes  Enzo Bi, MD [facility] If 7PM-7AM, please contact night-coverage 10/21/2021, 8:32 PM

## 2021-10-21 NOTE — Procedures (Signed)
  OPERATIVE NOTE   PROCEDURE: Ultrasound guidance for vascular access LEFT femoral vein Placement of a 13 x 20cm dialysis catheter Left Femoral vein  PRE-OPERATIVE DIAGNOSIS: 1. Acute renal failure 2. CKD III  POST-OPERATIVE DIAGNOSIS: Same  SURGEON: Jamesetta So, MD  ASSISTANT(S): None  ANESTHESIA: local  ESTIMATED BLOOD LOSS: Minimal   FINDING(S): 1.  None  SPECIMEN(S):  None  INDICATIONS:    Patient is a 76 y.o.female who presents with Acute on Chronic renal failure.  Risks and benefits were discussed, and informed consent was obtained..  DESCRIPTION: After obtaining full informed written consent, the patient was laid flat in the bed.  The LEFT groin was sterilely prepped and draped in a sterile surgical field was created. The Left femoral  vein was visualized with ultrasound and found to be widely patent. It was then accessed under direct guidance without difficulty with a Seldinger needle and a permanent image was recorded. A J-wire was then placed. After skin nick and dilatation, a 13 x 20cm dialysis catheter was placed over the wire and the wire was removed. The lumens withdrew dark red nonpulsatile blood and flushed easily with sterile saline. The catheter was secured to the skin with 3 nylon sutures. Sterile dressing was placed.  COMPLICATIONS: None  CONDITION: Stable  Riham Polyakov A 10/21/2021 3:59 PM  This note was created with Dragon Medical transcription system. Any errors in dictation are purely unintentional.

## 2021-10-21 NOTE — Progress Notes (Addendum)
Patient s/p LEFT renal artery stenting on 10/09 for renal artery stenosis and HTN. Progressive Acute on Chronic stage III renal failure. Now increasing O2 requirement, shortness of breath and increasing Cr- 5.37.  Now requiring urgent HD. Will place temporary catheter. Discussed with patient and brother. She states she does not want an IJ catheter- will place femoral. Risks, benefits, alternatives discussed. All questions answered. Understanding expressed. Consent obtained.

## 2021-10-21 NOTE — Progress Notes (Signed)
[facility]  ROUNDING NOTE   Subjective:   Patient had multiple complaints in today visit. Patient informed me that she started feeling short of breath last night and since then she has been needing oxygen. Patient also complains of lower extremity edema.  Patient also complained of feeling weak. Patient complains of malaise. Patient's brother was present in the room and he was concerned about her kidney function. I then discussed with the patient about her current kidney related issues. I discussed the patient about need/risk/benefit of renal placement therapy   Objective:  Vital signs in last 24 hours:  Temp:  [97.9 F (36.6 C)-98.9 F (37.2 C)] 98.9 F (37.2 C) (10/14 0544) Pulse Rate:  [59-76] 63 (10/14 0544) Resp:  [18] 18 (10/14 0544) BP: (142-176)/(54-64) 163/56 (10/14 0544) SpO2:  [93 %-95 %] 93 % (10/14 0544)  Weight change:  Filed Weights   10/11/21 1209 10/12/21 1355 10/16/21 1140  Weight: 84.8 kg 85.1 kg 85.1 kg    Intake/Output: I/O last 3 completed shifts: In: 2529.1 [P.O.:75; I.V.:2454.1] Out: 1600 [Urine:1600]   Intake/Output this shift:  Total I/O In: -  Out: 400 [Urine:400]  Physical Exam: General: NAD  Head: Normocephalic, atraumatic. Moist oral mucosal membranes  Eyes: Anicteric  Lungs:  Moderate respiratory distress, decreased breath sounds at bases  Heart: Regular rate and rhythm  Abdomen:  Soft, nontender  Extremities:  1+ peripheral edema.  Neurologic: Nonfocal, moving all four extremities  Skin: No lesions  Access: None    Basic Metabolic Panel: Recent Labs  Lab 10/17/21 0329 10/18/21 0900 10/19/21 0452 10/20/21 0540 10/21/21 0526  NA 134* 131* 132* 133* 128*  K 5.1 4.4 4.8 4.6 4.6  CL 104 105 106 104 100  CO2 18* 18* 16* 18* 17*  GLUCOSE 170* 144* 166* 156* 169*  BUN 40* 54* 52* 61* 70*  CREATININE 2.98* 4.28* 4.21* 4.93* 5.37*  CALCIUM 8.8* 8.6* 8.5* 8.8* 8.4*  MG  --  2.0 2.0 2.2 2.2    Liver Function  Tests: No results for input(s): "AST", "ALT", "ALKPHOS", "BILITOT", "PROT", "ALBUMIN" in the last 168 hours. No results for input(s): "LIPASE", "AMYLASE" in the last 168 hours. No results for input(s): "AMMONIA" in the last 168 hours.  CBC: Recent Labs  Lab 10/17/21 0329 10/18/21 0900 10/19/21 0452 10/20/21 0540 10/21/21 0526  WBC 13.0* 9.3 9.3 8.6 8.2  HGB 9.8* 8.8* 8.6* 9.0* 8.4*  HCT 29.7* 25.8* 26.0* 26.7* 24.5*  MCV 84.9 84.9 85.5 84.8 82.5  PLT 175 155 173 190 198    Cardiac Enzymes: No results for input(s): "CKTOTAL", "CKMB", "CKMBINDEX", "TROPONINI" in the last 168 hours.  BNP: Invalid input(s): "POCBNP"  CBG: Recent Labs  Lab 10/20/21 0746 10/20/21 1220 10/20/21 1712 10/20/21 2107 10/20/21 2341  GLUCAP 160* 217* 241* 53* 198*    Microbiology: Results for orders placed or performed during the hospital encounter of 03/01/21  MRSA Next Gen by PCR, Nasal     Status: None   Collection Time: 03/01/21  4:14 PM   Specimen: Nasal Mucosa; Nasal Swab  Result Value Ref Range Status   MRSA by PCR Next Gen NOT DETECTED NOT DETECTED Final    Comment: (NOTE) The GeneXpert MRSA Assay (FDA approved for NASAL specimens only), is one component of a comprehensive MRSA colonization surveillance program. It is not intended to diagnose MRSA infection nor to guide or monitor treatment for MRSA infections. Test performance is not FDA approved in patients less than 8 years old. Performed at  [address] [place], [place] [address]     Coagulation Studies: No results for input(s): "LABPROT", "INR" in the last 72 hours.   Urinalysis: No results for input(s): "COLORURINE", "LABSPEC", "PHURINE", "GLUCOSEU", "HGBUR", "BILIRUBINUR", "KETONESUR", "PROTEINUR", "UROBILINOGEN", "NITRITE", "LEUKOCYTESUR" in the last 72 hours.  Invalid input(s): "APPERANCEUR"    Imaging: DG Chest Port 1 View  Result Date: 10/19/2021 CLINICAL DATA:  Shortness of  breath EXAM: PORTABLE CHEST 1 VIEW COMPARISON:  Previous studies including the examination of 10/11/2021 FINDINGS: Transverse diameter of heart is slightly increased. Central pulmonary vessels are more prominent. Increased interstitial markings are seen in parahilar regions and lower lung fields. Small bilateral pleural effusions are seen. There is no pneumothorax. IMPRESSION: There is prominence of central pulmonary vessels along with interval increase in interstitial markings suggesting CHF with pulmonary edema. Possibility of underlying pneumonia is not excluded. Interval appearance of small bilateral pleural effusions. Electronically Signed   By: Elmer Picker M.D.   On: 10/19/2021 09:42     Medications:       amLODipine  10 mg Oral Daily   aspirin EC  81 mg Oral Daily   atorvastatin  40 mg Oral QHS   cloNIDine  0.2 mg Oral TID   clopidogrel  75 mg Oral Daily   famotidine  20 mg Oral QHS   heparin  5,000 Units Subcutaneous Q8H   hydrALAZINE  100 mg Oral TID   insulin aspart  0-5 Units Subcutaneous QHS   insulin aspart  0-9 Units Subcutaneous TID WC   metoprolol tartrate  100 mg Oral BID   patiromer  16.8 g Oral Once   sodium bicarbonate  1,300 mg Oral BID   acetaminophen, docusate sodium, hydrALAZINE, loratadine, ondansetron (ZOFRAN) IV, ondansetron (ZOFRAN) IV, polyethylene glycol  Assessment/ Plan:  Ms. AMAI CAPPIELLO is a 76 y.o.  female  with past medical conditions including diabetes, CAD, hypertension, bilateral carotid artery stenosis, abdominal aorta and iliac artery aneurysm, hyperlipidemia, and chronic kidney disease stage IIIa, who was admitted to [facility] on 10/11/2021 for Hypertensive crisis [I16.9] Hypertensive urgency [I16.0] Hypertension, unspecified type [I10] Acute renal failure ([place]) [N17.9]   Acute kidney injury on chronic kidney disease stage IIIa.  Baseline creatinine appears to be 1.1 with GFR 58 on 06/29/2021.  AKI in the setting of malignant hypertension  possibly secondary to renal artery stenosis. CT angio abdomen pelvis from February 13, 2021 shows suspected significant narrowing in both renal arteries along with large amount of calcified atherosclerotic plaque within the abdominal aorta. Appreciate vascular surgery placing left renal artery stent On October 16 2021.  Vascular plans to attempt intervention in right renal artery and 4 to 6 weeks to minimize renal effects.        Creatinine Continues to worsen.  Patient creatinine was at around 1.3 on February 2023 and is now at 5.4.  There is a component of renal infarction       Patient is having shortness of breath      Patient is complaining of malaise      Patient is having increasing lower extremity edema.  I discussed with the patient about need/risk/benefit of renal placement therapy.  After extensive discussion patient was willing to go for dialysis     I then contacted vascular surgeon and the primary team  Lab Results  Component Value Date   CREATININE 5.37 (H) 10/21/2021   CREATININE 4.93 (H) 10/20/2021   CREATININE 4.21 (H) 10/19/2021    Intake/Output Summary (  Last 24 hours) at 10/21/2021 4360 Last data filed at 10/21/2021 0500 Gross per 24 hour  Intake --  Output 400 ml  Net -400 ml   2. Hypertensive urgency with chronic kidney disease.  Blood pressure on ED arrival 203/68.  Home regimen includes hydrochlorothiazide, metoprolol, ramipril, and recently added amlodipine.  Ramipril currently held.  Patient also receiving clonidine.  Blood pressure elevated today, 176/62.   3. Anemia of chronic kidney disease  Lab Results  Component Value Date   HGB 8.4 (L) 10/21/2021    Hemoglobin stable.  4.  Hyperkalemia.  Potassium stable   5. Chronic metabolic acidosis Patient bicarb is low  6. Hyponatremia Patient has low sodium secondary to AKI on CKD/inability to get rid of free water   Plan We will ask for chest x-ray We will ask for temporary dialysis catheter placement We  will dialyze patient today   Addendum Patient was seen on dialysis Patient tolerated treatment well    LOS: 7 Lakedra Washington s [facility] 10/14/20236:14 AM

## 2021-10-21 NOTE — Progress Notes (Signed)
Pt completed 2 hour tx w/ no complications. Alert, vss, no c/o, report to primary RN. UDJSH:[id number] End:1923 551ml fluid removed 24L BVP No HD meds ordered

## 2021-10-21 NOTE — Plan of Care (Signed)

## 2021-10-22 DIAGNOSIS — I160 Hypertensive urgency: Secondary | ICD-10-CM | POA: Diagnosis not present

## 2021-10-22 LAB — CBC
HCT: 25.7 % — ABNORMAL LOW (ref 36.0–46.0)
Hemoglobin: 8.8 g/dL — ABNORMAL LOW (ref 12.0–15.0)
MCH: 28.1 pg (ref 26.0–34.0)
MCHC: 34.2 g/dL (ref 30.0–36.0)
MCV: 82.1 fL (ref 80.0–100.0)
Platelets: 214 10*3/uL (ref 150–400)
RBC: 3.13 MIL/uL — ABNORMAL LOW (ref 3.87–5.11)
RDW: 12.7 % (ref 11.5–15.5)
WBC: 9.2 10*3/uL (ref 4.0–10.5)
nRBC: 0 % (ref 0.0–0.2)

## 2021-10-22 LAB — BASIC METABOLIC PANEL
Anion gap: 10 (ref 5–15)
BUN: 51 mg/dL — ABNORMAL HIGH (ref 8–23)
CO2: 22 mmol/L (ref 22–32)
Calcium: 8.3 mg/dL — ABNORMAL LOW (ref 8.9–10.3)
Chloride: 96 mmol/L — ABNORMAL LOW (ref 98–111)
Creatinine, Ser: 4.71 mg/dL — ABNORMAL HIGH (ref 0.44–1.00)
GFR, Estimated: 9 mL/min — ABNORMAL LOW (ref >60)
Glucose, Bld: 184 mg/dL — ABNORMAL HIGH (ref 70–99)
Potassium: 4.2 mmol/L (ref 3.5–5.1)
Sodium: 128 mmol/L — ABNORMAL LOW (ref 135–145)

## 2021-10-22 LAB — GLUCOSE, CAPILLARY
Glucose-Capillary: 185 mg/dL — ABNORMAL HIGH (ref 70–99)
Glucose-Capillary: 246 mg/dL — ABNORMAL HIGH (ref 70–99)
Glucose-Capillary: 250 mg/dL — ABNORMAL HIGH (ref 70–99)
Glucose-Capillary: 216 mg/dL — ABNORMAL HIGH (ref 70–99)

## 2021-10-22 LAB — MAGNESIUM: Magnesium: 2 mg/dL (ref 1.7–2.4)

## 2021-10-22 LAB — IRON AND TIBC
Iron: 29 ug/dL (ref 28–170)
Saturation Ratios: 12 % (ref 10.4–31.8)
TIBC: 252 ug/dL (ref 250–450)
UIBC: 223 ug/dL

## 2021-10-22 LAB — FERRITIN: Ferritin: 200 ng/mL (ref 11–307)

## 2021-10-22 LAB — FOLATE: Folate: 13.7 ng/mL (ref >5.9)

## 2021-10-22 LAB — VITAMIN B12: Vitamin B-12: 369 pg/mL (ref 180–914)

## 2021-10-22 NOTE — Progress Notes (Signed)
PROGRESS NOTE    Dawn Ford  ZOX:[id number] DOB: 02/11/1945 DOA: 10/11/2021 PCP: Idelle Crouch, MD  118A/118A-AA  LOS: 8 days   Brief hospital course:   Assessment & Plan: Dawn Ford is a 76 y.o. 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    Patient states that her blood pressure has not been controlled well recently.  She had elevated blood pressure up to SBP 200 yesterday.  Her cardiologist added amlodipine 5 mg to current regimen (including ramipril, HCTZ and metoprolol).     Hypertensive urgency Initially blood pressure 203/68.  BP meds increased but BP still not controlled.  Likely due to renal artery stenosis. --cont home amlodipine at increased 10 mg daily --cont Lopressor at increased 100 mg BID --cont oral hydralazine 100 mg 3 times daily --cont clonidine 0.2 mg at increased TID   Bilateral Renal artery stenosis  --10/9--s/p left renal artery stent placement --10/10-- CT abdomen pelvis done without contrast shows perinephric stranding likley due to  mild infarction post procedure causing abdominal pain. -- Discussed with Dr. Lucky Cowboy, recommends continue PRN pain meds and supportive care  Acute renal failure superimposed on stage 3a chronic kidney disease ([place]) Possibly due to dehydration and continuation for ramipril and HCTZ.  And later due to renal infarct during stent placement. --nephro consulted, started on temp dialysis on 10/14 Plan: --hold MIVF for now --Hold ramipril and HCTZ --iHD per nephro  Metabolic acidosis --due to kidney failure --cont oral bicarb (new)   Respiratory distress 2/2 pulm edema from fluid overload --CXR 10/12 showed pulm edema, 2/2 fluid overload from MIVF given for AKI --IV lasix 40 mg x1 on 10/12  Coronary artery disease Chest pain and history of CAD: Her chest pain has  resolved.  Troponin negative x2. -Blood pressure control as above -As needed nitroglycerin  -Continue aspirin, Lipitor, Toprol  Hyperlipemia - Lipitor   Type II diabetes mellitus with renal manifestations (HCC) Recent A1c 6.0, well controlled.  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  Today, pt reported feeling much better.  No dyspnea.   Objective: Vitals:   10/21/21 2154 10/22/21 0622 10/22/21 0906 10/22/21 1100  BP: (!) 142/52 (!) 181/49 (!) 182/61 (!) 148/48  Pulse: (!) 58 68 67   Resp: 18 18 18    Temp: 98.1 F (36.7 C) 98.8 F (37.1 C)  98.4 F (36.9 C)  TempSrc:  Oral Oral Oral  SpO2: 96% 95% 92%   Weight:      Height:        Intake/Output Summary (Last 24 hours) at 10/22/2021 1517 Last data filed at 10/22/2021 1100 Gross per 24 hour  Intake 180 ml  Output 500 ml  Net -320 ml   Filed Weights   10/11/21 1209 10/12/21 1355 10/16/21  1140  Weight: 84.8 kg 85.1 kg 85.1 kg    Examination:   Constitutional: NAD, AAOx3 HEENT: conjunctivae and lids normal, EOMI CV: No cyanosis.   RESP: normal respiratory effort, on RA Extremities: No effusions, edema in BLE SKIN: warm, dry Neuro: II - XII grossly intact.   Psych: Normal mood and affect.  Appropriate judgement and reason   Data Reviewed: I have personally reviewed labs and imaging studies  Time spent: 25  minutes  Enzo Bi, MD [facility] If 7PM-7AM, please contact night-coverage 10/22/2021, 3:17 PM

## 2021-10-22 NOTE — Progress Notes (Addendum)
[facility]  ROUNDING NOTE   Subjective:   Patient was seen today on the first floor. Patient informed me that she was feeling better than yesterday   Objective:  Vital signs in last 24 hours:  Temp:  [97.7 F (36.5 C)-98.8 F (37.1 C)] 98.4 F (36.9 C) (10/15 1100) Pulse Rate:  [55-70] 67 (10/15 0906) Resp:  [12-20] 18 (10/15 0906) BP: (136-182)/(48-62) 148/48 (10/15 1100) SpO2:  [92 %-97 %] 92 % (10/15 0906)  Weight change:  Filed Weights   10/11/21 1209 10/12/21 1355 10/16/21 1140  Weight: 84.8 kg 85.1 kg 85.1 kg    Intake/Output: I/O last 3 completed shifts: In: 480 [P.O.:480] Out: 900 [Urine:400; Other:500]   Intake/Output this shift:  Total I/O In: 180 [P.O.:180] Out: -   Physical Exam: General: NAD  Head: Normocephalic, atraumatic. Moist oral mucosal membranes  Eyes: Anicteric  Lungs:  Moderate respiratory distress, decreased breath sounds at bases  Heart: Regular rate and rhythm  Abdomen:  Soft, nontender  Extremities:  1+ peripheral edema.  Neurologic: Nonfocal, moving all four extremities  Skin: No lesions  Access: Temporary catheter in situ    Basic Metabolic Panel: Recent Labs  Lab 10/18/21 0900 10/19/21 0452 10/20/21 0540 10/21/21 0526 10/21/21 1900 10/22/21 0642  NA 131* 132* 133* 128* 131* 128*  K 4.4 4.8 4.6 4.6 3.8 4.2  CL 105 106 104 100 96* 96*  CO2 18* 16* 18* 17* 24 22  GLUCOSE 144* 166* 156* 169* 129* 184*  BUN 54* 52* 61* 70* 46* 51*  CREATININE 4.28* 4.21* 4.93* 5.37* 3.38* 4.71*  CALCIUM 8.6* 8.5* 8.8* 8.4* 8.2* 8.3*  MG 2.0 2.0 2.2 2.2  --  2.0  PHOS  --   --   --   --  2.7  --     Liver Function Tests: Recent Labs  Lab 10/21/21 1900  ALBUMIN 3.1*   No results for input(s): "LIPASE", "AMYLASE" in the last 168 hours. No results for input(s): "AMMONIA" in the last 168 hours.  CBC: Recent Labs  Lab 10/19/21 0452 10/20/21 0540 10/21/21 0526 10/21/21 1900 10/22/21 0642  WBC 9.3 8.6 8.2 12.0* 9.2   HGB 8.6* 9.0* 8.4* 8.4* 8.8*  HCT 26.0* 26.7* 24.5* 24.4* 25.7*  MCV 85.5 84.8 82.5 81.6 82.1  PLT 173 190 198 181 214    Cardiac Enzymes: No results for input(s): "CKTOTAL", "CKMB", "CKMBINDEX", "TROPONINI" in the last 168 hours.  BNP: Invalid input(s): "POCBNP"  CBG: Recent Labs  Lab 10/21/21 0813 10/21/21 1120 10/21/21 1658 10/21/21 2154 10/22/21 0836  GLUCAP 169* 261* 229* 139* 185*    Microbiology: Results for orders placed or performed during the hospital encounter of 03/01/21  MRSA Next Gen by PCR, Nasal     Status: None   Collection Time: 03/01/21  4:14 PM   Specimen: Nasal Mucosa; Nasal Swab  Result Value Ref Range Status   MRSA by PCR Next Gen NOT DETECTED NOT DETECTED Final    Comment: (NOTE) The GeneXpert MRSA Assay (FDA approved for NASAL specimens only), is one component of a comprehensive MRSA colonization surveillance program. It is not intended to diagnose MRSA infection nor to guide or monitor treatment for MRSA infections. Test performance is not FDA approved in patients less than 92 years old. Performed at [facility], [address] [place], [place] [address]     Coagulation Studies: No results for input(s): "LABPROT", "INR" in the last 72 hours.   Urinalysis: No results for input(s): "COLORURINE", "LABSPEC", "  PHURINE", "GLUCOSEU", "HGBUR", "BILIRUBINUR", "KETONESUR", "PROTEINUR", "UROBILINOGEN", "NITRITE", "LEUKOCYTESUR" in the last 72 hours.  Invalid input(s): "APPERANCEUR"    Imaging: DG Chest 1 View  Result Date: 10/21/2021 CLINICAL DATA:  Shortness of breath EXAM: CHEST  1 VIEW COMPARISON:  10/19/2021 FINDINGS: Stable heart size. Aortic atherosclerosis. Mild pulmonary vascular congestion with improving interstitial edema. Trace bilateral pleural effusions. No pneumothorax. IMPRESSION: Improving interstitial edema. Electronically Signed   By: Davina Poke D.O.   On: 10/21/2021 15:19     Medications:        amLODipine  10 mg Oral Daily   aspirin EC  81 mg Oral Daily   atorvastatin  40 mg Oral QHS   Chlorhexidine Gluconate Cloth  6 each Topical Q0600   cloNIDine  0.2 mg Oral TID   clopidogrel  75 mg Oral Daily   famotidine  20 mg Oral QHS   heparin  5,000 Units Subcutaneous Q8H   hydrALAZINE  100 mg Oral TID   insulin aspart  0-5 Units Subcutaneous QHS   insulin aspart  0-9 Units Subcutaneous TID WC   metoprolol tartrate  100 mg Oral BID   patiromer  16.8 g Oral Once   sodium bicarbonate  1,300 mg Oral BID   acetaminophen, docusate sodium, hydrALAZINE, loratadine, ondansetron (ZOFRAN) IV, ondansetron (ZOFRAN) IV, polyethylene glycol  Assessment/ Plan:  Ms. MEHEK GREGA is a 76 y.o.  female  with past medical conditions including diabetes, CAD, hypertension, bilateral carotid artery stenosis, abdominal aorta and iliac artery aneurysm, hyperlipidemia, and chronic kidney disease stage IIIa, who was admitted to [facility] on 10/11/2021 for Hypertensive crisis [I16.9] Hypertensive urgency [I16.0] Hypertension, unspecified type [I10] Acute renal failure ([place]) [N17.9]   Acute kidney injury on chronic kidney disease stage IIIa.  Baseline creatinine appears to be 1.1 with GFR 58 on 06/29/2021.  AKI in the setting of malignant hypertension possibly secondary to renal artery stenosis. CT angio abdomen pelvis from February 13, 2021 shows suspected significant narrowing in both renal arteries along with large amount of calcified atherosclerotic plaque within the abdominal aorta. Appreciate vascular surgery placing left renal artery stent On October 16 2021.  Vascular plans to attempt intervention in right renal artery and 4 to 6 weeks to minimize renal effects.        Creatinine Continues to worsen.  Patient creatinine was at around 1.3 on February 2023 and is now at 5.4.  There is a component of renal infarction       Patient had her first dialysis session yesterday on October 21, 2021       No need for renal  placement therapy today.We will follow   Lab Results  Component Value Date   CREATININE 4.71 (H) 10/22/2021   CREATININE 3.38 (H) 10/21/2021   CREATININE 5.37 (H) 10/21/2021    Intake/Output Summary (Last 24 hours) at 10/22/2021 1218 Last data filed at 10/22/2021 1100 Gross per 24 hour  Intake 180 ml  Output 500 ml  Net -320 ml   2. Hypertensive urgency with chronic kidney disease.  Blood pressure on ED arrival 203/68 Blood pressure is now better controlled   3. Anemia of chronic kidney disease  Lab Results  Component Value Date   HGB 8.8 (L) 10/22/2021    Patient hemoglobin has improved from 8.4-8.8 We will ask for anemia profile  4.  Hyperkalemia.  Potassium stable   5. Chronic metabolic acidosis Patient bicarb is low  6. Hyponatremia Patient has low sodium secondary to AKI on CKD/inability to  get rid of free water   Plan We will dialyze patient tomorrow    LOS: 8 Kelin Nixon s Devaun Hernandez 10/15/202312:18 PM

## 2021-10-23 DIAGNOSIS — I160 Hypertensive urgency: Secondary | ICD-10-CM | POA: Diagnosis not present

## 2021-10-23 LAB — BASIC METABOLIC PANEL
Anion gap: 11 (ref 5–15)
BUN: 59 mg/dL — ABNORMAL HIGH (ref 8–23)
CO2: 24 mmol/L (ref 22–32)
Calcium: 8.4 mg/dL — ABNORMAL LOW (ref 8.9–10.3)
Chloride: 94 mmol/L — ABNORMAL LOW (ref 98–111)
Creatinine, Ser: 5.18 mg/dL — ABNORMAL HIGH (ref 0.44–1.00)
GFR, Estimated: 8 mL/min — ABNORMAL LOW (ref >60)
Glucose, Bld: 169 mg/dL — ABNORMAL HIGH (ref 70–99)
Potassium: 4.3 mmol/L (ref 3.5–5.1)
Sodium: 129 mmol/L — ABNORMAL LOW (ref 135–145)

## 2021-10-23 LAB — CBC
HCT: 24.4 % — ABNORMAL LOW (ref 36.0–46.0)
Hemoglobin: 8.3 g/dL — ABNORMAL LOW (ref 12.0–15.0)
MCH: 28.5 pg (ref 26.0–34.0)
MCHC: 34 g/dL (ref 30.0–36.0)
MCV: 83.8 fL (ref 80.0–100.0)
Platelets: 218 10*3/uL (ref 150–400)
RBC: 2.91 MIL/uL — ABNORMAL LOW (ref 3.87–5.11)
RDW: 12.5 % (ref 11.5–15.5)
WBC: 8 10*3/uL (ref 4.0–10.5)
nRBC: 0 % (ref 0.0–0.2)

## 2021-10-23 LAB — MAGNESIUM: Magnesium: 2.4 mg/dL (ref 1.7–2.4)

## 2021-10-23 LAB — GLUCOSE, CAPILLARY
Glucose-Capillary: 203 mg/dL — ABNORMAL HIGH (ref 70–99)
Glucose-Capillary: 109 mg/dL — ABNORMAL HIGH (ref 70–99)
Glucose-Capillary: 197 mg/dL — ABNORMAL HIGH (ref 70–99)
Glucose-Capillary: 173 mg/dL — ABNORMAL HIGH (ref 70–99)

## 2021-10-23 MED ORDER — EPOETIN ALFA 4000 UNIT/ML IJ SOLN: 3000.0000 [IU] | INTRAMUSCULAR | Status: DC

## 2021-10-23 NOTE — Progress Notes (Signed)
Pre HD RN assessment 

## 2021-10-23 NOTE — Progress Notes (Signed)
Post hd rn assessment 

## 2021-10-23 NOTE — Progress Notes (Signed)
Pt 2.5 hour HD treatment completed w/ no complications. Pt alert, no c/o, stable, report to primary RN. Start: 0905 End: 1137 546ml fluid removed 45L BVP No HD meds ordered

## 2021-10-23 NOTE — Progress Notes (Signed)
HD orders changed from BFR 400 to 300 to accomodate 2nd HD treatment per Sherlyn Hay, NP.

## 2021-10-23 NOTE — Progress Notes (Signed)
PROGRESS NOTE    SHANTIA SANFORD  OZD:[id number] DOB: 1945-02-07 DOA: 10/11/2021 PCP: Idelle Crouch, MD  118A/118A-AA  LOS: 9 days   Brief hospital course:   Assessment & Plan: [name] is a 76 y.o. 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    Patient states that her blood pressure has not been controlled well recently.  She had elevated blood pressure up to SBP 200 yesterday.  Her cardiologist added amlodipine 5 mg to current regimen (including ramipril, HCTZ and metoprolol).     Hypertensive urgency Initially blood pressure 203/68.  BP meds increased but BP still not controlled.  Likely due to renal artery stenosis. --cont home amlodipine at increased 10 mg daily --cont Lopressor at increased 100 mg BID --cont oral hydralazine 100 mg 3 times daily --cont clonidine 0.2 mg at increased TID   Bilateral Renal artery stenosis  --10/9--s/p left renal artery stent placement --10/10-- CT abdomen pelvis done without contrast shows perinephric stranding likley due to  mild infarction post procedure causing abdominal pain. -- Discussed with Dr. Lucky Cowboy, recommends continue PRN pain meds and supportive care  Acute renal failure superimposed on stage 3a chronic kidney disease ([place]) Possibly due to dehydration and continuation for ramipril and HCTZ.  And later due to renal infarct during stent placement. --nephro consulted, started on temp dialysis on 10/14 Plan: --hold MIVF for now --Hold ramipril and HCTZ --iHD per nephro  Metabolic acidosis --due to kidney failure --cont oral bicarb (new)   Respiratory distress 2/2 pulm edema from fluid overload --CXR 10/12 showed pulm edema, 2/2 fluid overload from MIVF given for AKI --IV lasix 40 mg x1 on 10/12  Coronary artery disease Chest pain and history of CAD: Her chest pain has  resolved.  Troponin negative x2. -Blood pressure control as above -As needed nitroglycerin  -Continue aspirin, Lipitor, Toprol  Hyperlipemia - Lipitor   Type II diabetes mellitus with renal manifestations (HCC) Recent A1c 6.0, well controlled.  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  Went for 2nd session of HD.     Objective: Vitals:   10/23/21 1148 10/23/21 1210 10/23/21 1218 10/23/21 1536  BP: (!) 170/54 (!) 179/58 (!) 179/58 (!) 168/59  Pulse: 74 75 76 69  Resp:   17 17  Temp:   98.3 F (36.8 C) 98.5 F (36.9 C)  TempSrc:    Oral  SpO2:   98% 96%  Weight:      Height:        Intake/Output Summary (Last 24 hours) at 10/23/2021 1812 Last data filed at 10/23/2021 1148 Gross per 24 hour  Intake --  Output 1000 ml  Net -1000 ml   Filed Weights   10/11/21 1209 10/12/21 1355  10/16/21 1140  Weight: 84.8 kg 85.1 kg 85.1 kg    Examination:   Constitutional: NAD, AAOx3 HEENT: conjunctivae and lids normal, EOMI CV: No cyanosis.   RESP: normal respiratory effort, on RA Neuro: II - XII grossly intact.   Psych: depressed mood and affect.  Appropriate judgement and reason   Data Reviewed: I have personally reviewed labs and imaging studies  Time spent: 25 minutes  Enzo Bi, MD [facility] If 7PM-7AM, please contact  night-coverage 10/23/2021, 6:12 PM

## 2021-10-23 NOTE — Progress Notes (Addendum)
[facility]  ROUNDING NOTE   Subjective:   Patient was seen today on the first floor. Patient main complaint in today visit was she was not feeling good.  Patient had nonspecific complaints of just not feeling well. I discussed the case with patient's nursing team who told me that patient was refusing her antihypertensives.  I then requested patient to please take her medications   Objective:  Vital signs in last 24 hours:  Temp:  [97.8 F (36.6 C)-98.9 F (37.2 C)] 98.5 F (36.9 C) (10/16 1536) Pulse Rate:  [61-76] 69 (10/16 1536) Resp:  [13-18] 17 (10/16 1536) BP: (158-185)/(49-61) 168/59 (10/16 1536) SpO2:  [94 %-99 %] 96 % (10/16 1536)  Weight change:  Filed Weights   10/11/21 1209 10/12/21 1355 10/16/21 1140  Weight: 84.8 kg 85.1 kg 85.1 kg    Intake/Output: I/O last 3 completed shifts: In: 180 [P.O.:180] Out: 1400 [Urine:400; Other:1000]   Intake/Output this shift:  No intake/output data recorded.  Physical Exam: General: NAD  Head: Normocephalic, atraumatic. Moist oral mucosal membranes  Eyes: Anicteric  Lungs:  Moderate respiratory distress, decreased breath sounds at bases  Heart: Regular rate and rhythm  Abdomen:  Soft, nontender  Extremities:  1+ peripheral edema.  Neurologic: Nonfocal, moving all four extremities  Skin: No lesions  Access: Temporary catheter in situ    Basic Metabolic Panel: Recent Labs  Lab 10/19/21 0452 10/20/21 0540 10/21/21 0526 10/21/21 1900 10/22/21 0642 10/23/21 0434  NA 132* 133* 128* 131* 128* 129*  K 4.8 4.6 4.6 3.8 4.2 4.3  CL 106 104 100 96* 96* 94*  CO2 16* 18* 17* 24 22 24   GLUCOSE 166* 156* 169* 129* 184* 169*  BUN 52* 61* 70* 46* 51* 59*  CREATININE 4.21* 4.93* 5.37* 3.38* 4.71* 5.18*  CALCIUM 8.5* 8.8* 8.4* 8.2* 8.3* 8.4*  MG 2.0 2.2 2.2  --  2.0 2.4  PHOS  --   --   --  2.7  --   --     Liver Function Tests: Recent Labs  Lab 10/21/21 1900  ALBUMIN 3.1*   No results for input(s):  "LIPASE", "AMYLASE" in the last 168 hours. No results for input(s): "AMMONIA" in the last 168 hours.  CBC: Recent Labs  Lab 10/20/21 0540 10/21/21 0526 10/21/21 1900 10/22/21 0642 10/23/21 0434  WBC 8.6 8.2 12.0* 9.2 8.0  HGB 9.0* 8.4* 8.4* 8.8* 8.3*  HCT 26.7* 24.5* 24.4* 25.7* 24.4*  MCV 84.8 82.5 81.6 82.1 83.8  PLT 190 198 181 214 218    Cardiac Enzymes: No results for input(s): "CKTOTAL", "CKMB", "CKMBINDEX", "TROPONINI" in the last 168 hours.  BNP: Invalid input(s): "POCBNP"  CBG: Recent Labs  Lab 10/22/21 1705 10/22/21 2049 10/23/21 0801 10/23/21 1220 10/23/21 1633  GLUCAP 250* 216* 203* 109* 197*    Microbiology: Results for orders placed or performed during the hospital encounter of 03/01/21  MRSA Next Gen by PCR, Nasal     Status: None   Collection Time: 03/01/21  4:14 PM   Specimen: Nasal Mucosa; Nasal Swab  Result Value Ref Range Status   MRSA by PCR Next Gen NOT DETECTED NOT DETECTED Final    Comment: (NOTE) The GeneXpert MRSA Assay (FDA approved for NASAL specimens only), is one component of a comprehensive MRSA colonization surveillance program. It is not intended to diagnose MRSA infection nor to guide or monitor treatment for MRSA infections. Test performance is not FDA approved in patients less than 53 years old. Performed at [employer]  [facility], [address] [place],  [address]     Coagulation Studies: No results for input(s): "LABPROT", "INR" in the last 72 hours.   Urinalysis: No results for input(s): "COLORURINE", "LABSPEC", "PHURINE", "GLUCOSEU", "HGBUR", "BILIRUBINUR", "KETONESUR", "PROTEINUR", "UROBILINOGEN", "NITRITE", "LEUKOCYTESUR" in the last 72 hours.  Invalid input(s): "APPERANCEUR"    Imaging: No results found.   Medications:       amLODipine  10 mg Oral Daily   aspirin EC  81 mg Oral Daily   atorvastatin  40 mg Oral QHS   Chlorhexidine Gluconate Cloth  6 each Topical Q0600   cloNIDine  0.2 mg  Oral TID   clopidogrel  75 mg Oral Daily   [START ON 10/24/2021] epoetin (EPOGEN/PROCRIT) injection  3,000 Units Intravenous Q T,Th,Sa-HD   famotidine  20 mg Oral QHS   heparin  5,000 Units Subcutaneous Q8H   hydrALAZINE  100 mg Oral TID   insulin aspart  0-5 Units Subcutaneous QHS   insulin aspart  0-9 Units Subcutaneous TID WC   metoprolol tartrate  100 mg Oral BID   patiromer  16.8 g Oral Once   sodium bicarbonate  1,300 mg Oral BID   acetaminophen, docusate sodium, hydrALAZINE, loratadine, ondansetron (ZOFRAN) IV, ondansetron (ZOFRAN) IV, polyethylene glycol  Assessment/ Plan:  Ms. ALANEA WOOLRIDGE is a 76 y.o.  female  with past medical conditions including diabetes, CAD, hypertension, bilateral carotid artery stenosis, abdominal aorta and iliac artery aneurysm, hyperlipidemia, and chronic kidney disease stage IIIa, who was admitted to [facility] on 10[facility]11/2021 for Hypertensive crisis [I16.9] Hypertensive urgency [I16.0] Hypertension, unspecified type [I10] Acute renal failure ([place]) [N17.9]   Acute kidney injury on chronic kidney disease stage IIIa. 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 Baseline creatinine appears to be 1.1 with GFR 58 on 06/29/2021.  AKI in the setting of malignant hypertension possibly secondary to renal artery stenosis. CT angio abdomen pelvis from February 13, 2021 shows suspected significant narrowing in both renal arteries along with large amount of calcified atherosclerotic plaque within the abdominal aorta. Appreciate vascular surgery placing left renal artery stent On October 16 2021.  Vascular plans to attempt intervention in right renal artery and 4 to 6 weeks to minimize renal effects.        Creatinine Continues to worsen.  Patient creatinine was at around 1.3 on February 2023 and is now at 5.4.  There is a component of renal infarction       Patient had her first dialysis session yesterday on October 21, 2021       Patient creatinine continues to rise        Patient was not dialyzed yesterday/on October 22, 2021       We will dialyze patient today        We will discuss with the vascular team about need for tunneled catheter  Lab Results  Component Value Date   CREATININE 5.18 (H) 10/23/2021   CREATININE 4.71 (H) 10/22/2021   CREATININE 3.38 (H) 10/21/2021    Intake/Output Summary (Last 24 hours) at 10/23/2021 1907 Last data filed at 10/23/2021 1845 Gross per 24 hour  Intake --  Output 1400 ml  Net -1400 ml   2. Hypertensive urgency with chronic kidney disease.  Blood pressure on ED arrival 203/68 Blood pressure is uncontrolled There is a component of nonadherence Agree with cardiologist/primary team increasing the clonidine dose. Dialysis should help   3. Anemia of chronic kidney disease  Lab Results  Component Value Date  HGB 8.3 (L) 10/23/2021    Patient hemoglobin remains low I asked for anemia work-up and it showed  Latest Reference Range & Units 10/22/21 12:31  Iron 28 - 170 ug/dL 29  UIBC ug/dL 223  TIBC 250 - 450 ug/dL 252  Saturation Ratios 10.4 - 31.8 % 12  Ferritin 11 - 307 ng/mL 200  Folate >5.9 ng/mL 13.7  Vitamin B12 180 - 914 pg/mL 369   We will start patient on Epogen during dialysis  4.  Hyperkalemia.  Potassium stable   5. Chronic metabolic acidosis Patient bicarb is low  6. Hyponatremia Patient has low sodium secondary to AKI on CKD/inability to get rid of free water   Plan We will dialyze patient Today    LOS: 9 Maxwell Martorano s [facility] 10/16/20237:07 PM

## 2021-10-23 NOTE — Progress Notes (Signed)
Pre hd patient verbalizes "I feel like I am going to die because of my blood pressure." Pt denies wanting to speak with hospital clergy. Sherlyn Hay, NP present and aware.

## 2021-10-24 DIAGNOSIS — I160 Hypertensive urgency: Secondary | ICD-10-CM | POA: Diagnosis not present

## 2021-10-24 LAB — CBC
HCT: 24.3 % — ABNORMAL LOW (ref 36.0–46.0)
Hemoglobin: 8.2 g/dL — ABNORMAL LOW (ref 12.0–15.0)
MCH: 29 pg (ref 26.0–34.0)
MCHC: 33.7 g/dL (ref 30.0–36.0)
MCV: 85.9 fL (ref 80.0–100.0)
Platelets: 220 10*3/uL (ref 150–400)
RBC: 2.83 MIL/uL — ABNORMAL LOW (ref 3.87–5.11)
RDW: 12.9 % (ref 11.5–15.5)
WBC: 8 10*3/uL (ref 4.0–10.5)
nRBC: 0 % (ref 0.0–0.2)

## 2021-10-24 LAB — HEPATITIS B SURFACE ANTIBODY, QUANTITATIVE: Hep B S AB Quant (Post): 143.7 m[IU]/mL (ref >9.9)

## 2021-10-24 LAB — BASIC METABOLIC PANEL
Anion gap: 12 (ref 5–15)
BUN: 39 mg/dL — ABNORMAL HIGH (ref 8–23)
CO2: 25 mmol/L (ref 22–32)
Calcium: 8.5 mg/dL — ABNORMAL LOW (ref 8.9–10.3)
Chloride: 95 mmol/L — ABNORMAL LOW (ref 98–111)
Creatinine, Ser: 3.71 mg/dL — ABNORMAL HIGH (ref 0.44–1.00)
GFR, Estimated: 12 mL/min — ABNORMAL LOW (ref >60)
Glucose, Bld: 150 mg/dL — ABNORMAL HIGH (ref 70–99)
Potassium: 4.3 mmol/L (ref 3.5–5.1)
Sodium: 132 mmol/L — ABNORMAL LOW (ref 135–145)

## 2021-10-24 LAB — GLUCOSE, CAPILLARY
Glucose-Capillary: 160 mg/dL — ABNORMAL HIGH (ref 70–99)
Glucose-Capillary: 174 mg/dL — ABNORMAL HIGH (ref 70–99)
Glucose-Capillary: 205 mg/dL — ABNORMAL HIGH (ref 70–99)
Glucose-Capillary: 201 mg/dL — ABNORMAL HIGH (ref 70–99)

## 2021-10-24 LAB — MAGNESIUM: Magnesium: 1.9 mg/dL (ref 1.7–2.4)

## 2021-10-24 MED ORDER — NIFEDIPINE ER 60 MG PO TB24
60.0000 mg | ORAL_TABLET | Freq: Every day | ORAL | Status: DC
  Administered 2021-10-24 – 2021-10-30 (×7): 60 mg via ORAL
  Filled 2021-10-24 (×7): qty 1

## 2021-10-24 MED ORDER — EPOETIN ALFA 4000 UNIT/ML IJ SOLN
INTRAMUSCULAR | Status: AC
  Filled 2021-10-24: qty 1

## 2021-10-24 MED ORDER — EPOETIN ALFA 10000 UNIT/ML IJ SOLN
4000.0000 [IU] | INTRAMUSCULAR | Status: DC
  Filled 2021-10-24: qty 1

## 2021-10-24 MED ORDER — HEPARIN SODIUM (PORCINE) 1000 UNIT/ML IJ SOLN
INTRAMUSCULAR | Status: AC
  Filled 2021-10-24: qty 10

## 2021-10-24 NOTE — Progress Notes (Signed)
PROGRESS NOTE    Dawn Ford  EHU:[id number] DOB: 18-Mar-1945 DOA: 10/11/2021 PCP: Idelle Crouch, MD  [id number]  LOS: 10 days   Brief hospital course:   Assessment & Plan: Dawn Ford is a 76 y.o. 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    Patient states that her blood pressure has not been controlled well recently.  She had elevated blood pressure up to SBP 200.  Her cardiologist added amlodipine 5 mg to home regimen (including ramipril, HCTZ and metoprolol).     Hypertensive urgency Initially blood pressure 203/68.  BP meds increased but BP still not controlled.  Likely due to renal artery stenosis. --switch amlodipine to Nifedipine 60 mg daily, per nephro rec. --cont Lopressor at increased 100 mg BID --cont oral hydralazine 100 mg 3 times daily --cont clonidine 0.2 mg at increased TID   Bilateral Renal artery stenosis  --10/9--s/p left renal artery stent placement --10/10-- CT abdomen pelvis done without contrast shows perinephric stranding likley due to  mild infarction post procedure causing abdominal pain. -- Discussed with Dr. Lucky Cowboy, recommends continue PRN pain meds and supportive care  Acute renal failure superimposed on stage 3a chronic kidney disease ([name]) Possibly due to dehydration and continuation for ramipril and HCTZ.  And later due to renal infarct during stent placement. --nephro consulted, started on temp dialysis on 10/14 Plan: --hold MIVF for now --Hold ramipril and HCTZ --iHD per nephro --nephro to decide whether/when pt should get PermCath.  Metabolic acidosis --due to kidney failure --cont oral bicarb (new)   Respiratory distress 2/2 pulm edema from fluid overload --CXR 10/12 showed pulm edema, 2/2 fluid overload from MIVF given for AKI --IV lasix 40 mg x1 on 10/12  Coronary artery  disease Chest pain and history of CAD: Her chest pain has resolved.  Troponin negative x2. -Blood pressure control as above -As needed nitroglycerin  -Continue aspirin, Lipitor, Toprol  Hyperlipemia - Lipitor   Type II diabetes mellitus with renal manifestations (HCC) Recent A1c 6.0, well controlled.  Patient was on Victoza, Actos and Amaryl PTA. 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  Nephro needs to decide if pt needs to be discharge on dialysis and then set up outpatient dialysis.   Subjective and Interval History:  Pt reported having some short of breath when she talks more.  Had another dialysis session today.   Objective: Vitals:   10/24/21 1148 10/24/21 1151 10/24/21 1229 10/24/21 1636  BP: (!) 179/68 (!) 180/67 (!) 191/65 (!) 164/50  Pulse: 76 76 79 70  Resp: 18 20 16 16   Temp: 98.4 F (36.9 C)  98 F (36.7 C) 98.4 F (36.9 C)  TempSrc: Oral     SpO2: 100% 98% 97% 97%  Weight:      Height:        Intake/Output Summary (Last 24 hours) at 10/24/2021 1814 Last data filed  at 10/24/2021 1151 Gross per 24 hour  Intake --  Output 900 ml  Net -900 ml   Filed Weights   10/12/21 1355 10/16/21 1140 10/24/21 0846  Weight: 85.1 kg 85.1 kg 85.2 kg    Examination:   Constitutional: NAD, AAOx3, sitting in recliner HEENT: conjunctivae and lids normal, EOMI CV: No cyanosis.   RESP: normal respiratory effort Neuro: II - XII grossly intact.   Psych: depressed mood and affect.   Appropriate judgement and reason   Data Reviewed: I have personally reviewed labs and imaging studies  Time spent: 25 minutes  Dawn Bi, MD [facility] If 7PM-7AM, please contact night-coverage 10/24/2021, 6:14 PM

## 2021-10-24 NOTE — Progress Notes (Signed)
PT did 3 hrs of HD Tolerated well with no signs of distress  UF = 500    10/24/21 1151  Vitals  BP (!) 180/67  MAP (mmHg) 99  BP Location Right Arm  BP Method Automatic  Patient Position (if appropriate) Lying  Pulse Rate 76  Pulse Rate Source Monitor  ECG Heart Rate 77  Resp 20  Post Treatment  Dialyzer Clearance Clear  Duration of HD Treatment -hour(s) 3 hour(s)  Hemodialysis Intake (mL) 0 mL  Liters Processed 72  Fluid Removed 500 mL  Tolerated HD Treatment Yes  Post-Hemodialysis Comments no concerns or complaints at this time. pt vitals are stable. RN give report to floor nurse. awaiting transport.  Note  Observations POST TX VITALS  Hemodialysis Catheter Left Femoral vein Triple lumen Temporary (Non-Tunneled)  Placement Date/Time: 10/21/21 1538   Placed prior to admission: No  Time Out: Correct patient;Correct site;Correct procedure  Maximum sterile barrier precautions: Hand hygiene;Cap;Mask;Sterile gown;Sterile gloves;Large sterile sheet  Site Prep: Chlorh...  Site Condition No complications  Blue Lumen Status Heparin locked;Dead end cap in place  Red Lumen Status Heparin locked;Dead end cap in place  Catheter fill solution Heparin 1000 units/ml  Catheter fill volume (Arterial) 1.4 cc  Catheter fill volume (Venous) 1.4  Dressing Type Transparent  Dressing Status Clean, Dry, Intact  Drainage Description None  Post treatment catheter status Capped and Clamped

## 2021-10-24 NOTE — Progress Notes (Addendum)
[facility]  ROUNDING NOTE   Subjective:   Patient was seen today on dialysis unit. Patient tolerating treatment well Patient informed that she was feeling better than yesterday    Objective:  Vital signs in last 24 hours:  Temp:  [98 F (36.7 C)-99 F (37.2 C)] 98.4 F (36.9 C) (10/17 1636) Pulse Rate:  [63-79] 70 (10/17 1636) Resp:  [15-25] 16 (10/17 1636) BP: (164-196)/(47-88) 164/50 (10/17 1636) SpO2:  [93 %-100 %] 97 % (10/17 1636) Weight:  [85.2 kg] 85.2 kg (10/17 0846)  Weight change:  Filed Weights   10/12/21 1355 10/16/21 1140 10/24/21 0846  Weight: 85.1 kg 85.1 kg 85.2 kg    Intake/Output: I/O last 3 completed shifts: In: -  Out: 1400 [Urine:400; Other:1000]   Intake/Output this shift:  Total I/O In: -  Out: 500 [Other:500]  Physical Exam: General: NAD  Head: Normocephalic, atraumatic. Moist oral mucosal membranes  Eyes: Anicteric  Lungs:  Moderate respiratory distress, decreased breath sounds at bases  Heart: Regular rate and rhythm  Abdomen:  Soft, nontender  Extremities:  1+ peripheral edema.  Neurologic: Nonfocal, moving all four extremities  Skin: No lesions  Access: Temporary catheter in situ    Basic Metabolic Panel: Recent Labs  Lab 10/20/21 0540 10/21/21 0526 10/21/21 1900 10/22/21 0642 10/23/21 0434 10/24/21 0513  NA 133* 128* 131* 128* 129* 132*  K 4.6 4.6 3.8 4.2 4.3 4.3  CL 104 100 96* 96* 94* 95*  CO2 18* 17* 24 22 24 25   GLUCOSE 156* 169* 129* 184* 169* 150*  BUN 61* 70* 46* 51* 59* 39*  CREATININE 4.93* 5.37* 3.38* 4.71* 5.18* 3.71*  CALCIUM 8.8* 8.4* 8.2* 8.3* 8.4* 8.5*  MG 2.2 2.2  --  2.0 2.4 1.9  PHOS  --   --  2.7  --   --   --     Liver Function Tests: Recent Labs  Lab 10/21/21 1900  ALBUMIN 3.1*   No results for input(s): "LIPASE", "AMYLASE" in the last 168 hours. No results for input(s): "AMMONIA" in the last 168 hours.  CBC: Recent Labs  Lab 10/21/21 0526 10/21/21 1900 10/22/21 0642  10/23/21 0434 10/24/21 0513  WBC 8.2 12.0* 9.2 8.0 8.0  HGB 8.4* 8.4* 8.8* 8.3* 8.2*  HCT 24.5* 24.4* 25.7* 24.4* 24.3*  MCV 82.5 81.6 82.1 83.8 85.9  PLT 198 181 214 218 220    Cardiac Enzymes: No results for input(s): "CKTOTAL", "CKMB", "CKMBINDEX", "TROPONINI" in the last 168 hours.  BNP: Invalid input(s): "POCBNP"  CBG: Recent Labs  Lab 10/23/21 1633 10/23/21 2039 10/24/21 0801 10/24/21 1226 10/24/21 1632  GLUCAP 197* 173* 160* 174* 205*    Microbiology: Results for orders placed or performed during the hospital encounter of 03/01/21  MRSA Next Gen by PCR, Nasal     Status: None   Collection Time: 03/01/21  4:14 PM   Specimen: Nasal Mucosa; Nasal Swab  Result Value Ref Range Status   MRSA by PCR Next Gen NOT DETECTED NOT DETECTED Final    Comment: (NOTE) The GeneXpert MRSA Assay (FDA approved for NASAL specimens only), is one component of a comprehensive MRSA colonization surveillance program. It is not intended to diagnose MRSA infection nor to guide or monitor treatment for MRSA infections. Test performance is not FDA approved in patients less than 22 years old. Performed at [facility], [address] [place], [place] [address]     Coagulation Studies: No results for input(s): "LABPROT", "INR" in the last  72 hours.   Urinalysis: No results for input(s): "COLORURINE", "LABSPEC", "PHURINE", "GLUCOSEU", "HGBUR", "BILIRUBINUR", "KETONESUR", "PROTEINUR", "UROBILINOGEN", "NITRITE", "LEUKOCYTESUR" in the last 72 hours.  Invalid input(s): "APPERANCEUR"    Imaging: No results found.   Medications:       aspirin EC  81 mg Oral Daily   atorvastatin  40 mg Oral QHS   Chlorhexidine Gluconate Cloth  6 each Topical Q0600   cloNIDine  0.2 mg Oral TID   clopidogrel  75 mg Oral Daily   epoetin (EPOGEN/PROCRIT) injection  4,000 Units Intravenous Q T,Th,Sa-HD   famotidine  20 mg Oral QHS   heparin  5,000 Units Subcutaneous Q8H   heparin sodium  (porcine)       hydrALAZINE  100 mg Oral TID   insulin aspart  0-5 Units Subcutaneous QHS   insulin aspart  0-9 Units Subcutaneous TID WC   metoprolol tartrate  100 mg Oral BID   NIFEdipine  60 mg Oral Daily   patiromer  16.8 g Oral Once   sodium bicarbonate  1,300 mg Oral BID   acetaminophen, docusate sodium, heparin sodium (porcine), hydrALAZINE, loratadine, ondansetron (ZOFRAN) IV, ondansetron (ZOFRAN) IV, polyethylene glycol  Assessment/ Plan:  [name] is a 76 y.o.  female  with past medical conditions including diabetes, CAD, hypertension, bilateral carotid artery stenosis, abdominal aorta and iliac artery aneurysm, hyperlipidemia, and chronic kidney disease stage IIIa, who was admitted to [facility] on 10/11/2021 for Hypertensive crisis [I16.9] Hypertensive urgency [I16.0] Hypertension, unspecified type [I10] Acute renal failure ([place]) [N17.9]   Acute kidney injury on chronic kidney disease stage IIIa. 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 Baseline creatinine appears to be 1.1 with GFR 58 on 06/29/2021.  AKI in the setting of malignant hypertension possibly secondary to renal artery stenosis. CT angio abdomen pelvis from February 13, 2021 shows suspected significant narrowing in both renal arteries along with large amount of calcified atherosclerotic plaque within the abdominal aorta. Appreciate vascular surgery placing left renal artery stent On October 16 2021.  Vascular plans to attempt intervention in right renal artery and 4 to 6 weeks to minimize renal effects.        Creatinine Continues to worsen.  Patient creatinine was at around 1.3 on February 2023 and is now at 5.4.  There is a component of renal infarction       Patient had her first dialysis session yesterday on October 21, 2021       We will dialyze patient today        We will discuss with the vascular team about need for tunneled catheter  Lab Results  Component Value Date   CREATININE 3.71 (H)  10/24/2021   CREATININE 5.18 (H) 10/23/2021   CREATININE 4.71 (H) 10/22/2021    Intake/Output Summary (Last 24 hours) at 10/24/2021 1731 Last data filed at 10/24/2021 1151 Gross per 24 hour  Intake --  Output 900 ml  Net -900 ml   2. Hypertensive urgency with chronic kidney disease.  Blood pressure on ED arrival 203/68 Patient blood pressure is uncontrolled We will change patient's amlodipine to nifedipine Fluid removal during dialysis should help as well   3. Anemia of chronic kidney disease  Lab Results  Component Value Date   HGB 8.2 (L) 10/24/2021    Patient hemoglobin remains low I asked for anemia work-up and it showed  Latest Reference Range & Units 10/22/21 12:31  Iron 28 - 170 ug/dL 29  UIBC ug/dL 223  TIBC  250 - 450 ug/dL 252  Saturation Ratios 10.4 - 31.8 % 12  Ferritin 11 - 307 ng/mL 200  Folate >5.9 ng/mL 13.7  Vitamin B12 180 - 914 pg/mL 369   We will start patient on Epogen during dialysis  4.  Hyperkalemia.  Potassium stable   5. Chronic metabolic acidosis Patient bicarb is low  6. Hyponatremia Patient has low sodium secondary to AKI on CKD/inability to get rid of free water   Plan  We will dialyze patient Today   Addendum Patient was seen on dialysis Patient tolerating treatment well    LOS: 10 Ziana Heyliger s Azavier Creson 10/17/20235:31 PM

## 2021-10-25 ENCOUNTER — Inpatient Hospital Stay: Payer: Medicare Other

## 2021-10-25 DIAGNOSIS — I160 Hypertensive urgency: Secondary | ICD-10-CM | POA: Diagnosis not present

## 2021-10-25 LAB — GLUCOSE, CAPILLARY
Glucose-Capillary: 168 mg/dL — ABNORMAL HIGH (ref 70–99)
Glucose-Capillary: 201 mg/dL — ABNORMAL HIGH (ref 70–99)
Glucose-Capillary: 113 mg/dL — ABNORMAL HIGH (ref 70–99)
Glucose-Capillary: 226 mg/dL — ABNORMAL HIGH (ref 70–99)

## 2021-10-25 LAB — BASIC METABOLIC PANEL
Anion gap: 11 (ref 5–15)
BUN: 29 mg/dL — ABNORMAL HIGH (ref 8–23)
CO2: 27 mmol/L (ref 22–32)
Calcium: 8.2 mg/dL — ABNORMAL LOW (ref 8.9–10.3)
Chloride: 92 mmol/L — ABNORMAL LOW (ref 98–111)
Creatinine, Ser: 3.09 mg/dL — ABNORMAL HIGH (ref 0.44–1.00)
GFR, Estimated: 15 mL/min — ABNORMAL LOW (ref >60)
Glucose, Bld: 176 mg/dL — ABNORMAL HIGH (ref 70–99)
Potassium: 4.1 mmol/L (ref 3.5–5.1)
Sodium: 130 mmol/L — ABNORMAL LOW (ref 135–145)

## 2021-10-25 LAB — MRSA NEXT GEN BY PCR, NASAL: MRSA by PCR Next Gen: NOT DETECTED

## 2021-10-25 LAB — CBC
HCT: 24.5 % — ABNORMAL LOW (ref 36.0–46.0)
Hemoglobin: 8.2 g/dL — ABNORMAL LOW (ref 12.0–15.0)
MCH: 28.1 pg (ref 26.0–34.0)
MCHC: 33.5 g/dL (ref 30.0–36.0)
MCV: 83.9 fL (ref 80.0–100.0)
Platelets: 224 10*3/uL (ref 150–400)
RBC: 2.92 MIL/uL — ABNORMAL LOW (ref 3.87–5.11)
RDW: 12.6 % (ref 11.5–15.5)
WBC: 9.5 10*3/uL (ref 4.0–10.5)
nRBC: 0 % (ref 0.0–0.2)

## 2021-10-25 LAB — MAGNESIUM: Magnesium: 1.8 mg/dL (ref 1.7–2.4)

## 2021-10-25 MED ORDER — FUROSEMIDE 10 MG/ML IJ SOLN: 40.0000 mg | Freq: Once | INTRAMUSCULAR | Status: AC

## 2021-10-25 MED ORDER — FUROSEMIDE 10 MG/ML IJ SOLN
INTRAMUSCULAR | Status: AC
  Filled 2021-10-25: qty 4

## 2021-10-25 MED ORDER — CEFAZOLIN SODIUM-DEXTROSE 1-4 GM/50ML-% IV SOLN
INTRAVENOUS | Status: AC
  Filled 2021-10-25: qty 50

## 2021-10-25 MED ORDER — BISACODYL 10 MG RE SUPP
10.0000 mg | Freq: Every day | RECTAL | Status: DC
  Administered 2021-10-25: 10 mg via RECTAL
  Filled 2021-10-25 (×2): qty 1

## 2021-10-25 MED ORDER — SODIUM CHLORIDE 0.9 % IV SOLN: INTRAVENOUS | Status: DC

## 2021-10-25 MED ORDER — FUROSEMIDE 10 MG/ML IJ SOLN
40.0000 mg | Freq: Once | INTRAMUSCULAR | Status: AC
  Administered 2021-10-25: 40 mg via INTRAVENOUS

## 2021-10-25 MED ORDER — CEFAZOLIN SODIUM-DEXTROSE 1-4 GM/50ML-% IV SOLN: 1.0000 g | INTRAVENOUS | Status: DC

## 2021-10-25 MED ORDER — CLONIDINE HCL 0.1 MG PO TABS
0.3000 mg | ORAL_TABLET | Freq: Three times a day (TID) | ORAL | Status: DC
  Administered 2021-10-25 – 2021-10-30 (×13): 0.3 mg via ORAL
  Filled 2021-10-25 (×13): qty 3

## 2021-10-25 MED ORDER — FLEET ENEMA 7-19 GM/118ML RE ENEM: 1.0000 | ENEMA | Freq: Every day | RECTAL | Status: DC | PRN

## 2021-10-25 NOTE — Progress Notes (Signed)
PT placed on NRB per Dr. Posey Pronto.  PT disimpacted by Tye Maryland RN per Dr. Posey Pronto as well. Mild to moderate amount of red blood seen coming from rectum, Dr. Posey Pronto made aware.  PT will be moved to ICU as soon as possible. Respiratory and Xray have been called to come STAT

## 2021-10-25 NOTE — Progress Notes (Signed)
MD Posey Pronto aware of no urine output after lasix administration.  Respiratory called for transport with BiPap up to ICU 2

## 2021-10-25 NOTE — Progress Notes (Signed)
PT did 3.25 hrs of HD, tolerated well with no signs of distress  UF = 2000 ml  After HD, pt was removed off bipap and tolerated well with O2sat of 97% HD cath heplocked and capped Report given to floor RN.    10/25/21 1730  Vitals  Temp 98.4 F (36.9 C)  Temp Source Oral  BP (!) 170/62  MAP (mmHg) 91  BP Location Right Arm  BP Method Automatic  Patient Position (if appropriate) Lying  Pulse Rate 66  Pulse Rate Source Monitor  ECG Heart Rate 66  Resp 14  Oxygen Therapy  SpO2 98 %  O2 Device Bi-PAP  During Treatment Monitoring  Intra-Hemodialysis Comments Tx completed;Tolerated well  Post Treatment  Dialyzer Clearance Clear  Duration of HD Treatment -hour(s) 3.25 hour(s)  Hemodialysis Intake (mL) 0 mL  Liters Processed 78  Fluid Removed 2000 mL  Tolerated HD Treatment Yes  Post-Hemodialysis Comments Tolerated well, no issue  Hemodialysis Catheter Left Femoral vein Triple lumen Temporary (Non-Tunneled)  Placement Date/Time: 10/21/21 1538   Placed prior to admission: No  Time Out: Correct patient;Correct site;Correct procedure  Maximum sterile barrier precautions: Hand hygiene;Cap;Mask;Sterile gown;Sterile gloves;Large sterile sheet  Site Prep: Chlorh...  Site Condition No complications  Blue Lumen Status Dead end cap in place;Heparin locked  Red Lumen Status Heparin locked;Dead end cap in place  Catheter fill solution Heparin 1000 units/ml  Catheter fill volume (Arterial) 1.4 cc  Catheter fill volume (Venous) 1.4  Dressing Type Transparent  Dressing Status Clean, Dry, Intact  Post treatment catheter status Capped and Clamped

## 2021-10-25 NOTE — Progress Notes (Signed)
Received notification from primary team that patient in special procedures with shortness of breath requiring NRB. Patient found to have crackles and wheeze. Primary team has ordered furosemide 40mg  IV and bipap. I will order dialysis treatment with UF. Patient will be transferred to ICU.

## 2021-10-25 NOTE — Progress Notes (Addendum)
[facility] NAME: Dawn Ford    MR#:  [id number]  DATE OF BIRTH:  01/27/1945  SUBJECTIVE:   Patient seen in specialist recovery. Preprocedure having increased shortness of breath and requirement non-rebreather. Profusely sweating. Patient tachypnea. Denies chest pain. Trying to have a bowel movement. No family at bedside.   VITALS:  Blood pressure (!) 163/62, pulse 62, temperature 98.8 F (37.1 C), resp. rate 18, height 5\' 6"  (1.676 m), weight 85.2 kg, SpO2 98 %.  PHYSICAL EXAMINATION:   GENERAL:  76 y.o.-year-old patient lying in the bed with acute distress.  LUNGS:decreased breath sounds bilaterally, rales +no wheezing NRB+ CARDIOVASCULAR: S1, S2 normal. No murmurs,  tachycardia ABDOMEN: Soft, nontender, nondistended. Bowel sounds present.  EXTREMITIES: No  edema b/l.    NEUROLOGIC: nonfocal  patient is alert and awake SKIN: No obvious rash, lesion, or ulcer.   LABORATORY PANEL:  CBC Recent Labs  Lab 10/25/21 0416  WBC 9.5  HGB 8.2*  HCT 24.5*  PLT 224    Chemistries  Recent Labs  Lab 10/25/21 0416  NA 130*  K 4.1  CL 92*  CO2 27  GLUCOSE 176*  BUN 29*  CREATININE 3.09*  CALCIUM 8.2*  MG 1.8   Cardiac Enzymes No results for input(s): "TROPONINI" in the last 168 hours. RADIOLOGY:  DG Chest Port 1 View  Result Date: 10/25/2021 CLINICAL DATA:  Shortness of breath EXAM: PORTABLE CHEST 1 VIEW COMPARISON:  Chest x-ray dated October 14th 2023 FINDINGS: Visualized cardiac and mediastinal contours are within normal limits. Increased bilateral interstitial opacities. Small bilateral pleural effusions and bibasilar atelectasis, increased when compared with the prior exam. No evidence of pneumothorax. IMPRESSION: 1. Worsened pulmonary edema. 2. Increased size of small bilateral pleural effusions. Electronically Signed   By: Yetta Glassman M.D.   On: 10/25/2021 10:58    Assessment and Plan HAGAN MALTZ is a 76  y.o. 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     Acute Respiratory distress 2/2 pulm edema from fluid overload --CXR 10/18 showed pulm edema, 2/2 fluid overload from MIVF given for AKI --IV lasix 40 mg x1 on 10/18 --placed on NRB --pt to get urgent HD --she had previous episode on 10/12 --HD perm cath placement on hold for now --transfer to step down --bipap prn  Hypertensive urgency Initially blood pressure 203/68.  BP meds increased but BP still not controlled.  Likely due to renal artery stenosis. --switch amlodipine to Nifedipine 60 mg daily, per nephro rec. --cont Lopressor at increased 100 mg BID --cont oral hydralazine 100 mg 3 times daily --cont clonidine 0.2 mg at increased TID --elevated today due to above   Bilateral Renal artery stenosis  --10/9--s/p left renal artery stent placement --10/10-- CT abdomen pelvis done without contrast shows perinephric stranding likley due to  mild infarction post procedure causing abdominal pain. -- Discussed with Dr. Lucky Cowboy, recommends continue PRN pain meds and supportive care   Acute renal failure superimposed on stage 3a chronic kidney disease ([place]) Possibly due to dehydration and continuation for ramipril and HCTZ.  And later due to renal infarct during stent placement. --nephro consulted, started on temp dialysis on 10/14 --Hold ramipril and HCTZ --iHD per nephro --pt will get PermCath once stable from resp standpoint   Metabolic acidosis --due to kidney failure --cont oral bicarb (  new)    Coronary artery disease Chest pain and history of CAD: Her chest pain has resolved.  Troponin negative x2. -Blood pressure control as above -As needed nitroglycerin  -Continue aspirin, Lipitor, Toprol   Hyperlipemia - Lipitor   Type II diabetes mellitus with renal manifestations  (HCC) Recent A1c 6.0, well controlled.  Patient was on Victoza, Actos and Amaryl PTA. 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  >50% time spent on counselling and coordination of care  Note: This dictation was prepared with Dragon dictation along with smaller phrase technology. Any transcriptional errors that result from this process are unintentional.  Fritzi Mandes M.D    [facility]   CC: Primary care physician; Idelle Crouch, MD

## 2021-10-25 NOTE — Progress Notes (Signed)
PT brought to specials recovery. Almost immediately upon pt entering room , pt was noted by this RN to have labored breathing, tachypnea and accessory muscle use and cough. PT states this just started very recently, pt unable to complete full sentence. Paged Dr. Posey Pronto, see new orders/MAR. Dr. Posey Pronto coming to evaluate pt.

## 2021-10-25 NOTE — Progress Notes (Addendum)
[facility]  ROUNDING NOTE   Subjective:    Patient was seen today on first floor Patient condition worsened during the day. Patient was to go for a tunneled catheter placement.  Patient later became short of breath.  Patient was transferred to the ICU Patient was urgently dialyzed    Objective:  Vital signs in last 24 hours:  Temp:  [96.8 F (36 C)-98.8 F (37.1 C)] 98.4 F (36.9 C) (10/18 1730) Pulse Rate:  [60-79] 63 (10/18 1900) Resp:  [11-30] 13 (10/18 1900) BP: (144-193)/(48-121) 145/48 (10/18 1900) SpO2:  [92 %-99 %] 98 % (10/18 1900) FiO2 (%):  [40 %] 40 % (10/18 1053)  Weight change:  Filed Weights   10/12/21 1355 10/16/21 1140 10/24/21 0846  Weight: 85.1 kg 85.1 kg 85.2 kg    Intake/Output: I/O last 3 completed shifts: In: 200 [P.O.:200] Out: 2500 [Other:2500]   Intake/Output this shift:  No intake/output data recorded.  Physical Exam: General: NAD  Head: Normocephalic, atraumatic. Moist oral mucosal membranes  Eyes: Anicteric  Lungs:  Moderate respiratory distress, decreased breath sounds at bases  Heart: Regular rate and rhythm  Abdomen:  Soft, nontender  Extremities:  1+ peripheral edema.  Neurologic: Nonfocal, moving all four extremities  Skin: No lesions  Access: Temporary catheter in situ    Basic Metabolic Panel: Recent Labs  Lab 10/21/21 0526 10/21/21 1900 10/22/21 0642 10/23/21 0434 10/24/21 0513 10/25/21 0416  NA 128* 131* 128* 129* 132* 130*  K 4.6 3.8 4.2 4.3 4.3 4.1  CL 100 96* 96* 94* 95* 92*  CO2 17* 24 22 24 25 27   GLUCOSE 169* 129* 184* 169* 150* 176*  BUN 70* 46* 51* 59* 39* 29*  CREATININE 5.37* 3.38* 4.71* 5.18* 3.71* 3.09*  CALCIUM 8.4* 8.2* 8.3* 8.4* 8.5* 8.2*  MG 2.2  --  2.0 2.4 1.9 1.8  PHOS  --  2.7  --   --   --   --     Liver Function Tests: Recent Labs  Lab 10/21/21 1900  ALBUMIN 3.1*   No results for input(s): "LIPASE", "AMYLASE" in the last 168 hours. No results for input(s):  "AMMONIA" in the last 168 hours.  CBC: Recent Labs  Lab 10/21/21 1900 10/22/21 0642 10/23/21 0434 10/24/21 0513 10/25/21 0416  WBC 12.0* 9.2 8.0 8.0 9.5  HGB 8.4* 8.8* 8.3* 8.2* 8.2*  HCT 24.4* 25.7* 24.4* 24.3* 24.5*  MCV 81.6 82.1 83.8 85.9 83.9  PLT 181 214 218 220 224    Cardiac Enzymes: No results for input(s): "CKTOTAL", "CKMB", "CKMBINDEX", "TROPONINI" in the last 168 hours.  BNP: Invalid input(s): "POCBNP"  CBG: Recent Labs  Lab 10/24/21 1632 10/24/21 2052 10/25/21 0805 10/25/21 1123 10/25/21 1638  GLUCAP 205* 201* 168* 201* 113*    Microbiology: Results for orders placed or performed during the hospital encounter of 10/11/21  MRSA Next Gen by PCR, Nasal     Status: None   Collection Time: 10/25/21  5:52 PM   Specimen: Nasal Mucosa; Nasal Swab  Result Value Ref Range Status   MRSA by PCR Next Gen NOT DETECTED NOT DETECTED Final    Comment: (NOTE) The GeneXpert MRSA Assay (FDA approved for NASAL specimens only), is one component of a comprehensive MRSA colonization surveillance program. It is not intended to diagnose MRSA infection nor to guide or monitor treatment for MRSA infections. Test performance is not FDA approved in patients less than 71 years old. Performed at [facility], [address] [place],  [place] [address]     Coagulation Studies: No results for input(s): "LABPROT", "INR" in the last 72 hours.   Urinalysis: No results for input(s): "COLORURINE", "LABSPEC", "PHURINE", "GLUCOSEU", "HGBUR", "BILIRUBINUR", "KETONESUR", "PROTEINUR", "UROBILINOGEN", "NITRITE", "LEUKOCYTESUR" in the last 72 hours.  Invalid input(s): "APPERANCEUR"    Imaging: DG Chest Port 1 View  Result Date: 10/25/2021 CLINICAL DATA:  Shortness of breath EXAM: PORTABLE CHEST 1 VIEW COMPARISON:  Chest x-ray dated October 14th 2023 FINDINGS: Visualized cardiac and mediastinal contours are within normal limits. Increased bilateral interstitial opacities.  Small bilateral pleural effusions and bibasilar atelectasis, increased when compared with the prior exam. No evidence of pneumothorax. IMPRESSION: 1. Worsened pulmonary edema. 2. Increased size of small bilateral pleural effusions. Electronically Signed   By: Yetta Glassman M.D.   On: 10/25/2021 10:58     Medications:       aspirin EC  81 mg Oral Daily   atorvastatin  40 mg Oral QHS   bisacodyl  10 mg Rectal Daily   Chlorhexidine Gluconate Cloth  6 each Topical Q0600   cloNIDine  0.3 mg Oral TID   clopidogrel  75 mg Oral Daily   epoetin (EPOGEN/PROCRIT) injection  4,000 Units Intravenous Q T,Th,Sa-HD   famotidine  20 mg Oral QHS   heparin  5,000 Units Subcutaneous Q8H   hydrALAZINE  100 mg Oral TID   insulin aspart  0-5 Units Subcutaneous QHS   insulin aspart  0-9 Units Subcutaneous TID WC   metoprolol tartrate  100 mg Oral BID   NIFEdipine  60 mg Oral Daily   sodium bicarbonate  1,300 mg Oral BID   acetaminophen, docusate sodium, hydrALAZINE, loratadine, ondansetron (ZOFRAN) IV, ondansetron (ZOFRAN) IV, polyethylene glycol, sodium phosphate  Assessment/ Plan:  Dawn Ford is a 76 y.o.  female  with past medical conditions including diabetes, CAD, hypertension, bilateral carotid artery stenosis, abdominal aorta and iliac artery aneurysm, hyperlipidemia, and chronic kidney disease stage IIIa, who was admitted to [facility] on 10/11/2021 for Hypertensive crisis [I16.9] Hypertensive urgency [I16.0] Hypertension, unspecified type [I10] Acute renal failure ([place]) [N17.9]   Acute kidney injury on chronic kidney disease stage IIIa. 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 Baseline creatinine appears to be 1.1 with GFR 58 on 06/29/2021.  AKI in the setting of malignant hypertension possibly secondary to renal artery stenosis. CT angio abdomen pelvis from February 13, 2021 shows suspected significant narrowing in both renal arteries along with large amount of calcified  atherosclerotic plaque within the abdominal aorta. Appreciate vascular surgery placing left renal artery stent On October 16 2021.  Vascular plans to attempt intervention in right renal artery and 4 to 6 weeks to minimize renal effects.        Creatinine Continues to worsen.  Patient creatinine was at around 1.3 on February 2023 and is now at 5.4.  There is a component of renal infarction       Patient had her first dialysis session yesterday on October 21, 2021        Patient was last dialyzed yesterday,        We will discuss with the vascular team about need for tunneled catheter   Addendum Patient condition during the day Worsened Patient was reassessed Patient required BiPAP Patient chest x-ray from October 18 showed patient had worsening edema Patient was transferred to ICU Patient was emergently dialyzed  Lab Results  Component Value Date   CREATININE 3.09 (H) 10/25/2021   CREATININE 3.71 (H) 10/24/2021  CREATININE 5.18 (H) 10/23/2021    Intake/Output Summary (Last 24 hours) at 10/25/2021 1945 Last data filed at 10/25/2021 1800 Gross per 24 hour  Intake 200 ml  Output 2000 ml  Net -1800 ml   2. Hypertensive urgency with chronic kidney disease.  Blood pressure on ED arrival 203/68 Patient blood pressure remains uncontrolled Patient's pulse is more than 70 We will increase the dose of clonidine from 0.25 mg p.o. 3 times daily to 0.3 mg p.o. 3 times daily We will follow   3. Anemia of chronic kidney disease  Lab Results  Component Value Date   HGB 8.2 (L) 10/25/2021    Patient hemoglobin remains low I asked for anemia work-up and it showed  Latest Reference Range & Units 10/22/21 12:31  Iron 28 - 170 ug/dL 29  UIBC ug/dL 223  TIBC 250 - 450 ug/dL 252  Saturation Ratios 10.4 - 31.8 % 12  Ferritin 11 - 307 ng/mL 200  Folate >5.9 ng/mL 13.7  Vitamin B12 180 - 914 pg/mL 369   patient on Epogen during dialysis  4.  Hyperkalemia.  Potassium stable   5. Chronic  metabolic acidosis Patient bicarb is low  6. Hyponatremia Patient has low sodium secondary to AKI on CKD/inability to get rid of free water  7.Respiratory distress Patient required BiPAP Patient  dialyzed emergently   Plan  Initially the plan was to ask for tunnel catheter and to increase patient's clonidine dose   Addendum Patient condition later worsened Patient needed emergent dialysis   I spent   30    minutes of critical care time. Critical care time was spent personally by me for these activities-development of treatment plan with the patient/patient family, discussion with the patient's primary team, discussion with the patient ICU team, evaluation of patient response to treatment/CRRT, examination of patient, ordering and performing treatments intervention, ordering and reviewing the lab data, and continuing evaluation of patient's condition.     LOS: 11 Dawn Ford s Bren Steers 10/18/20237:45 PM

## 2021-10-26 ENCOUNTER — Encounter
Admission: EM | Disposition: A | Discharge status: Home Health | Payer: Self-pay | Source: Home / Self Care | Attending: Internal Medicine

## 2021-10-26 DIAGNOSIS — N186 End stage renal disease: Secondary | ICD-10-CM

## 2021-10-26 DIAGNOSIS — Z992 Dependence on renal dialysis: Secondary | ICD-10-CM | POA: Diagnosis not present

## 2021-10-26 DIAGNOSIS — I160 Hypertensive urgency: Secondary | ICD-10-CM | POA: Diagnosis not present

## 2021-10-26 HISTORY — PX: DIALYSIS/PERMA CATHETER INSERTION: CATH118288

## 2021-10-26 LAB — BASIC METABOLIC PANEL
Anion gap: 8 (ref 5–15)
BUN: 24 mg/dL — ABNORMAL HIGH (ref 8–23)
CO2: 29 mmol/L (ref 22–32)
Calcium: 8.1 mg/dL — ABNORMAL LOW (ref 8.9–10.3)
Chloride: 97 mmol/L — ABNORMAL LOW (ref 98–111)
Creatinine, Ser: 3.16 mg/dL — ABNORMAL HIGH (ref 0.44–1.00)
GFR, Estimated: 14 mL/min — ABNORMAL LOW (ref >60)
Glucose, Bld: 165 mg/dL — ABNORMAL HIGH (ref 70–99)
Potassium: 4.1 mmol/L (ref 3.5–5.1)
Sodium: 134 mmol/L — ABNORMAL LOW (ref 135–145)

## 2021-10-26 LAB — GLUCOSE, CAPILLARY
Glucose-Capillary: 170 mg/dL — ABNORMAL HIGH (ref 70–99)
Glucose-Capillary: 158 mg/dL — ABNORMAL HIGH (ref 70–99)
Glucose-Capillary: 140 mg/dL — ABNORMAL HIGH (ref 70–99)
Glucose-Capillary: 227 mg/dL — ABNORMAL HIGH (ref 70–99)
Glucose-Capillary: 217 mg/dL — ABNORMAL HIGH (ref 70–99)

## 2021-10-26 SURGERY — DIALYSIS/PERMA CATHETER INSERTION
Anesthesia: Moderate Sedation

## 2021-10-26 MED ORDER — ALPRAZOLAM 0.25 MG PO TABS
0.2500 mg | ORAL_TABLET | Freq: Two times a day (BID) | ORAL | Status: DC | PRN
  Administered 2021-10-26: 0.25 mg via ORAL
  Filled 2021-10-26: qty 1

## 2021-10-26 MED ORDER — FENTANYL CITRATE (PF) 100 MCG/2ML IJ SOLN
INTRAMUSCULAR | Status: DC | PRN
  Administered 2021-10-26: 50 ug via INTRAVENOUS

## 2021-10-26 MED ORDER — CEFAZOLIN SODIUM-DEXTROSE 1-4 GM/50ML-% IV SOLN
INTRAVENOUS | Status: AC
  Administered 2021-10-26: 1 g via INTRAVENOUS
  Filled 2021-10-26: qty 50

## 2021-10-26 MED ORDER — FENTANYL CITRATE (PF) 100 MCG/2ML IJ SOLN
INTRAMUSCULAR | Status: AC
  Filled 2021-10-26: qty 2

## 2021-10-26 MED ORDER — MIDAZOLAM HCL 2 MG/2ML IJ SOLN
INTRAMUSCULAR | Status: DC | PRN
  Administered 2021-10-26 (×2): 1 mg via INTRAVENOUS

## 2021-10-26 MED ORDER — MIDAZOLAM HCL 5 MG/5ML IJ SOLN
INTRAMUSCULAR | Status: AC
  Filled 2021-10-26: qty 5

## 2021-10-26 MED ORDER — HEPARIN SODIUM (PORCINE) 10000 UNIT/ML IJ SOLN
INTRAMUSCULAR | Status: AC
  Filled 2021-10-26: qty 1

## 2021-10-26 SURGICAL SUPPLY — 6 items
CATH PALIN MAXID VT KIT 19CM (CATHETERS) IMPLANT
COVER PROBE ULTRASOUND 5X96 (MISCELLANEOUS) IMPLANT
DERMABOND ADVANCED .7 DNX12 (GAUZE/BANDAGES/DRESSINGS) IMPLANT
PACK ANGIOGRAPHY (CUSTOM PROCEDURE TRAY) IMPLANT
SUT MNCRL AB 4-0 PS2 18 (SUTURE) IMPLANT
SUT PROLENE 0 CT 1 30 (SUTURE) IMPLANT

## 2021-10-26 NOTE — Progress Notes (Signed)
Pt c/o being unable to void. Bladder scan reads 163 cc in bladder. Pt strained to void and perm cath started to ooze outside the dressing. Blood cleaned up and gauze applied under the dressing to check for new oozing. MD made aware. Per pt she is not constipated. Feels a warm sensation up her back when she tries to void and mouth becomes dry. Pad under pt is dry. Pt called RN into room again for new oozing but no new oozing is seen. Xanax ordered and will continue to monitor.

## 2021-10-26 NOTE — Op Note (Signed)
OPERATIVE NOTE    PRE-OPERATIVE DIAGNOSIS: 1. ESRD   POST-OPERATIVE DIAGNOSIS: same as above  PROCEDURE: Ultrasound guidance for vascular access to the right internal jugular vein Fluoroscopic guidance for placement of catheter Placement of a 19 cm tip to cuff tunneled hemodialysis catheter via the right internal jugular vein  SURGEON: [name], MD  ANESTHESIA:  Local with Moderate conscious sedation for approximately 15 minutes using 2 mg of Versed and 50 mcg of Fentanyl  ESTIMATED BLOOD LOSS: 10 cc  FLUORO TIME: less than one minute  CONTRAST: none  FINDING(S): 1.  Patent right internal jugular vein  SPECIMEN(S):  None  INDICATIONS:   Kristen L Brackeen is a 76 y.o.female who presents with renal failure.  The patient needs long term dialysis access for their ESRD, and a Permcath is necessary.  Risks and benefits are discussed and informed consent is obtained.    DESCRIPTION: After obtaining full informed written consent, the patient was brought back to the vascular suited. The patient's right neck and chest were sterilely prepped and draped in a sterile surgical field was created. Moderate conscious sedation was administered during a face to face encounter with the patient throughout the procedure with my supervision of the RN administering medicines and monitoring the patient's vital signs, pulse oximetry, telemetry and mental status throughout from the start of the procedure until the patient was taken to the recovery room.  The right internal jugular vein was visualized with ultrasound and found to be patent. It was then accessed under direct ultrasound guidance and a permanent image was recorded. A wire was placed. After skin nick and dilatation, the peel-away sheath was placed over the wire. I then turned my attention to an area under the clavicle. Approximately 1-2 fingerbreadths below the clavicle a small counterincision was created and tunneled from the subclavicular incision  to the access site. Using fluoroscopic guidance, a 19 centimeter tip to cuff tunneled hemodialysis catheter was selected, and tunneled from the subclavicular incision to the access site. It was then placed through the peel-away sheath and the peel-away sheath was removed. Using fluoroscopic guidance the catheter tips were parked in the right atrium. The appropriate distal connectors were placed. It withdrew blood well and flushed easily with heparinized saline and a concentrated heparin solution was then placed. It was secured to the chest wall with 2 Prolene sutures. The access incision was closed single 4-0 Monocryl. A 4-0 Monocryl pursestring suture was placed around the exit site. Sterile dressings were placed. The patient tolerated the procedure well and was taken to the recovery room in stable condition.  COMPLICATIONS: None  CONDITION: Stable  [name], MD 10/26/2021 9:44 AM   This note was created with Dragon Medical transcription system. Any errors in dictation are purely unintentional.

## 2021-10-26 NOTE — Progress Notes (Signed)
10/24/21 0840 10/24/21 0900 10/24/21 0930  Vitals  Temp  --   --   --   Temp Source  --   --   --   BP (!) 174/60 (!) 177/62 (!) 196/84  MAP (mmHg) 92 95 115  BP Location Right Arm Right Arm Right Arm  BP Method Automatic Automatic Automatic  Patient Position (if appropriate) Lying Lying Lying  Pulse Rate 68 69 75  Pulse Rate Source Monitor Monitor Monitor  ECG Heart Rate 68 69 77  Resp 19 (!) 22 (!) 25  During Treatment Monitoring  Blood Flow Rate (mL/min) 400 mL/min 400 mL/min 400 mL/min  Arterial Pressure (mmHg) -140 mmHg -140 mmHg -140 mmHg  Venous Pressure (mmHg) 180 mmHg 180 mmHg 180 mmHg  TMP (mmHg) 0 mmHg 0 mmHg 0 mmHg  Ultrafiltration Rate (mL/min) 433 mL/min 433 mL/min 433 mL/min  Dialysate Flow Rate (mL/min) 300 ml/min 300 ml/min 300 ml/min  HD Safety Checks Performed Yes Yes Yes  Intra-Hemodialysis Comments Tx initiated Progressing as prescribed Progressing as prescribed  Dialysis Fluid Bolus Normal Saline  --   --   Bolus Amount (mL)  --   --   --   Dialysate Change  --   --   --     10/24/21 1000 10/24/21 1030 10/24/21 1100  Vitals  Temp  --   --   --   Temp Source  --   --   --   BP (!) 186/61 (!) 187/60 (!) 184/63  MAP (mmHg) 98 96 98  BP Location Right Arm Right Arm Right Arm  BP Method Automatic Automatic Automatic  Patient Position (if appropriate) Lying Lying Lying  Pulse Rate 75 74 70  Pulse Rate Source Monitor Monitor Monitor  ECG Heart Rate 76 74 70  Resp (!) 24 20 17   During Treatment Monitoring  Blood Flow Rate (mL/min) 400 mL/min 400 mL/min 400 mL/min  Arterial Pressure (mmHg) -140 mmHg -140 mmHg -140 mmHg  Venous Pressure (mmHg) 190 mmHg 190 mmHg 180 mmHg  TMP (mmHg) -1 mmHg 0 mmHg -2 mmHg  Ultrafiltration Rate (mL/min) 433 mL/min 433 mL/min 262 mL/min  Dialysate Flow Rate (mL/min) 300 ml/min 300 ml/min 300 ml/min  HD Safety Checks Performed Yes Yes Yes  Intra-Hemodialysis Comments Progressing as prescribed Progressing as prescribed  Progressing as prescribed  Dialysis Fluid Bolus  --   --   --   Bolus Amount (mL)  --   --   --   Dialysate Change  --   --   --     10/24/21 1130 10/24/21 1148  Vitals  Temp  --  98.4 F (36.9 C)  Temp Source  --  Oral  BP (!) 174/88 (!) 179/68  MAP (mmHg) 101 100  BP Location Right Arm Right Arm  BP Method Automatic Automatic  Patient Position (if appropriate) Lying Lying  Pulse Rate 70 76  Pulse Rate Source Monitor Monitor  ECG Heart Rate 70 75  Resp 15 18  During Treatment Monitoring  Blood Flow Rate (mL/min) 400 mL/min  --   Arterial Pressure (mmHg) -140 mmHg  --   Venous Pressure (mmHg) 180 mmHg  --   TMP (mmHg) -1 mmHg  --   Ultrafiltration Rate (mL/min) 262 mL/min  --   Dialysate Flow Rate (mL/min) 300 ml/min  --   HD Safety Checks Performed Yes Yes  Intra-Hemodialysis Comments Progressing as prescribed Tx completed;Tolerated well  Dialysis Fluid Bolus  --  Normal Saline  Bolus  Amount (mL)  --  300 mL  Dialysate Change  --  3K;2.5 Ca

## 2021-10-26 NOTE — Progress Notes (Signed)
Working on outpatient hemodialysis placement.

## 2021-10-26 NOTE — Progress Notes (Signed)
   10/23/21 0905 10/23/21 0930 10/23/21 1000  Vitals  Temp  --   --   --   Temp Source  --   --   --   BP (!) 171/52 (!) 161/55 (!) 170/49  MAP (mmHg) 85 84 85  BP Location Left Arm Left Arm Left Arm  BP Method Automatic Automatic Automatic  Patient Position (if appropriate) Lying Lying Lying  Pulse Rate 66 61 65  Pulse Rate Source Monitor Monitor Monitor  ECG Heart Rate 66 61 64  Resp 13 15 14   During Treatment Monitoring  Blood Flow Rate (mL/min) 300 mL/min 300 mL/min 300 mL/min  Arterial Pressure (mmHg) -80 mmHg -90 mmHg -90 mmHg  Venous Pressure (mmHg) 160 mmHg 140 mmHg 140 mmHg  TMP (mmHg) 11 mmHg 11 mmHg 12 mmHg  Ultrafiltration Rate (mL/min) 480 mL/min 480 mL/min 480 mL/min  Dialysate Flow Rate (mL/min) 300 ml/min 300 ml/min 300 ml/min  HD Safety Checks Performed Yes Yes Yes  Intra-Hemodialysis Comments Tx initiated Progressing as prescribed Progressing as prescribed  Dialysis Fluid Bolus Normal Saline  --   --     10/23/21 1030 10/23/21 1100 10/23/21 1130  Vitals  Temp  --   --   --   Temp Source  --   --   --   BP (!) 175/55 (!) 176/61 (!) 174/57  MAP (mmHg) 91 95 90  BP Location Left Arm Left Arm Left Arm  BP Method Automatic Automatic Automatic  Patient Position (if appropriate) Lying Lying Lying  Pulse Rate 65 73 71  Pulse Rate Source Monitor Monitor Monitor  ECG Heart Rate 65 72 71  Resp 15 16 14   During Treatment Monitoring  Blood Flow Rate (mL/min) 300 mL/min 300 mL/min 300 mL/min  Arterial Pressure (mmHg) -90 mmHg -90 mmHg -90 mmHg  Venous Pressure (mmHg) 140 mmHg 150 mmHg 150 mmHg  TMP (mmHg) 12 mmHg 11 mmHg 12 mmHg  Ultrafiltration Rate (mL/min) 480 mL/min 402 mL/min 402 mL/min  Dialysate Flow Rate (mL/min) 300 ml/min 300 ml/min 300 ml/min  HD Safety Checks Performed Yes Yes Yes  Intra-Hemodialysis Comments Progressing as prescribed (Pt resting, no c/o, vss, ufr 90, access visible, safety maintained) Progressing as prescribed Progressing as prescribed   Dialysis Fluid Bolus  --   --   --     10/23/21 1137 10/23/21 1146  Vitals  Temp 97.9 F (36.6 C)  --   Temp Source Oral  --   BP (!) 182/57 (!) 179/57  MAP (mmHg) 93 93  BP Location Left Arm Left Arm  BP Method Automatic Automatic  Patient Position (if appropriate) Lying Lying  Pulse Rate 71 74  Pulse Rate Source Monitor Monitor  ECG Heart Rate 71 74  Resp 15 16  During Treatment Monitoring  Blood Flow Rate (mL/min)  --   --   Arterial Pressure (mmHg)  --   --   Venous Pressure (mmHg)  --   --   TMP (mmHg)  --   --   Ultrafiltration Rate (mL/min)  --   --   Dialysate Flow Rate (mL/min)  --   --   HD Safety Checks Performed Yes  --   Intra-Hemodialysis Comments Tolerated well;Tx completed Tx completed  Dialysis Fluid Bolus  --   --

## 2021-10-26 NOTE — Interval H&P Note (Signed)
History and Physical Interval Note:  10/26/2021 8:24 AM  Dawn Ford  has presented today for surgery, with the diagnosis of ESRD.  The various methods of treatment have been discussed with the patient and family. After consideration of risks, benefits and other options for treatment, the patient has consented to  Procedure(s): DIALYSIS/PERMA CATHETER INSERTION (N/A) as a surgical intervention.  The patient's history has been reviewed, patient examined, no change in status, stable for surgery.  I have reviewed the patient's chart and labs.  Questions were answered to the patient's satisfaction.     [name]

## 2021-10-26 NOTE — Progress Notes (Signed)
   10/25/21 1600 10/25/21 1615 10/25/21 1630  Vitals  BP (!) 165/60 (!) 171/65 (!) 171/66  MAP (mmHg) 91 97 94  BP Location Right Arm Right Arm Right Arm  BP Method Automatic Automatic Automatic  Patient Position (if appropriate) Lying Lying Lying  Pulse Rate 64 64 65  Pulse Rate Source Monitor Monitor Monitor  ECG Heart Rate 65 65 66  Resp 13  --  13  During Treatment Monitoring  Blood Flow Rate (mL/min) 400 mL/min 400 mL/min 400 mL/min  Arterial Pressure (mmHg) -160 mmHg -160 mmHg -160 mmHg  Venous Pressure (mmHg) 210 mmHg 210 mmHg 210 mmHg  TMP (mmHg) 5 mmHg 4 mmHg 4 mmHg  Ultrafiltration Rate (mL/min) 874 mL/min 874 mL/min 874 mL/min  Dialysate Flow Rate (mL/min) 300 ml/min 300 ml/min 300 ml/min  HD Safety Checks Performed Yes Yes Yes  Intra-Hemodialysis Comments Progressing as prescribed Progressing as prescribed Progressing as prescribed    10/25/21 1645 10/25/21 1700 10/25/21 1715  Vitals  BP (!) 170/61 (!) 167/65 (!) 161/71  MAP (mmHg) 93 96 96  BP Location Right Arm Right Arm Right Arm  BP Method Automatic Automatic Automatic  Patient Position (if appropriate) Lying Lying Lying  Pulse Rate 65 67 66  Pulse Rate Source Monitor Monitor Monitor  ECG Heart Rate 66 65 66  Resp 14 13 12   During Treatment Monitoring  Blood Flow Rate (mL/min) 400 mL/min 400 mL/min 400 mL/min  Arterial Pressure (mmHg) -160 mmHg -160 mmHg -160 mmHg  Venous Pressure (mmHg) 210 mmHg 210 mmHg 210 mmHg  TMP (mmHg) 4 mmHg 4 mmHg 4 mmHg  Ultrafiltration Rate (mL/min) 874 mL/min 874 mL/min 874 mL/min  Dialysate Flow Rate (mL/min) 300 ml/min 300 ml/min 300 ml/min  HD Safety Checks Performed Yes Yes Yes  Intra-Hemodialysis Comments Progressing as prescribed Progressing as prescribed Progressing as prescribed

## 2021-10-26 NOTE — Progress Notes (Addendum)
[facility]  ROUNDING NOTE   Subjective:    Patient required transfer to ICU/placement of BiPAP/emergent dialysis yesterday Patient is now stable     Objective:  Vital signs in last 24 hours:  Temp:  [96.8 F (36 C)-98.4 F (36.9 C)] 98.4 F (36.9 C) (10/19 0200) Pulse Rate:  [60-79] 65 (10/19 0600) Resp:  [11-30] 12 (10/19 0600) BP: (140-193)/(48-119) 150/58 (10/19 0600) SpO2:  [96 %-99 %] 97 % (10/19 0700) FiO2 (%):  [28 %-40 %] 28 % (10/19 0600)  Weight change:  Filed Weights   10/12/21 1355 10/16/21 1140 10/24/21 0846  Weight: 85.1 kg 85.1 kg 85.2 kg    Intake/Output: I/O last 3 completed shifts: In: 700 [P.O.:700] Out: 2400 [Urine:400; Other:2000]   Intake/Output this shift:  No intake/output data recorded.  Physical Exam: General: NAD  Head: Normocephalic, atraumatic. Moist oral mucosal membranes  Eyes: Anicteric  Lungs:  Moderate respiratory distress, decreased breath sounds at bases  Heart: Regular rate and rhythm  Abdomen:  Soft, nontender  Extremities:  1+ peripheral edema.  Neurologic: Nonfocal, moving all four extremities  Skin: No lesions  Access: Temporary catheter in situ    Basic Metabolic Panel: Recent Labs  Lab 10/21/21 0526 10/21/21 1900 10/22/21 0642 10/23/21 0434 10/24/21 0513 10/25/21 0416 10/26/21 0416  NA 128* 131* 128* 129* 132* 130* 134*  K 4.6 3.8 4.2 4.3 4.3 4.1 4.1  CL 100 96* 96* 94* 95* 92* 97*  CO2 17* 24 22 24 25 27 29   GLUCOSE 169* 129* 184* 169* 150* 176* 165*  BUN 70* 46* 51* 59* 39* 29* 24*  CREATININE 5.37* 3.38* 4.71* 5.18* 3.71* 3.09* 3.16*  CALCIUM 8.4* 8.2* 8.3* 8.4* 8.5* 8.2* 8.1*  MG 2.2  --  2.0 2.4 1.9 1.8  --   PHOS  --  2.7  --   --   --   --   --     Liver Function Tests: Recent Labs  Lab 10/21/21 1900  ALBUMIN 3.1*   No results for input(s): "LIPASE", "AMYLASE" in the last 168 hours. No results for input(s): "AMMONIA" in the last 168 hours.  CBC: Recent Labs  Lab  10/21/21 1900 10/22/21 0642 10/23/21 0434 10/24/21 0513 10/25/21 0416  WBC 12.0* 9.2 8.0 8.0 9.5  HGB 8.4* 8.8* 8.3* 8.2* 8.2*  HCT 24.4* 25.7* 24.4* 24.3* 24.5*  MCV 81.6 82.1 83.8 85.9 83.9  PLT 181 214 218 220 224    Cardiac Enzymes: No results for input(s): "CKTOTAL", "CKMB", "CKMBINDEX", "TROPONINI" in the last 168 hours.  BNP: Invalid input(s): "POCBNP"  CBG: Recent Labs  Lab 10/25/21 0805 10/25/21 1123 10/25/21 1638 10/25/21 2123 10/26/21 0737  GLUCAP 168* 201* 113* 71* 170*    Microbiology: Results for orders placed or performed during the hospital encounter of 10/11/21  MRSA Next Gen by PCR, Nasal     Status: None   Collection Time: 10/25/21  5:52 PM   Specimen: Nasal Mucosa; Nasal Swab  Result Value Ref Range Status   MRSA by PCR Next Gen NOT DETECTED NOT DETECTED Final    Comment: (NOTE) The GeneXpert MRSA Assay (FDA approved for NASAL specimens only), is one component of a comprehensive MRSA colonization surveillance program. It is not intended to diagnose MRSA infection nor to guide or monitor treatment for MRSA infections. Test performance is not FDA approved in patients less than 68 years old. Performed at [facility], [address], [place],  [address]     Coagulation Studies:  No results for input(s): "LABPROT", "INR" in the last 72 hours.   Urinalysis: No results for input(s): "COLORURINE", "LABSPEC", "PHURINE", "GLUCOSEU", "HGBUR", "BILIRUBINUR", "KETONESUR", "PROTEINUR", "UROBILINOGEN", "NITRITE", "LEUKOCYTESUR" in the last 72 hours.  Invalid input(s): "APPERANCEUR"    Imaging: DG Chest Port 1 View  Result Date: 10/25/2021 CLINICAL DATA:  Shortness of breath EXAM: PORTABLE CHEST 1 VIEW COMPARISON:  Chest x-ray dated October 14th 2023 FINDINGS: Visualized cardiac and mediastinal contours are within normal limits. Increased bilateral interstitial opacities. Small bilateral pleural effusions and bibasilar atelectasis,  increased when compared with the prior exam. No evidence of pneumothorax. IMPRESSION: 1. Worsened pulmonary edema. 2. Increased size of small bilateral pleural effusions. Electronically Signed   By: Yetta Glassman M.D.   On: 10/25/2021 10:58     Medications:       aspirin EC  81 mg Oral Daily   atorvastatin  40 mg Oral QHS   bisacodyl  10 mg Rectal Daily   Chlorhexidine Gluconate Cloth  6 each Topical Q0600   cloNIDine  0.3 mg Oral TID   clopidogrel  75 mg Oral Daily   epoetin (EPOGEN/PROCRIT) injection  4,000 Units Intravenous Q T,Th,Sa-HD   famotidine  20 mg Oral QHS   heparin  5,000 Units Subcutaneous Q8H   hydrALAZINE  100 mg Oral TID   insulin aspart  0-5 Units Subcutaneous QHS   insulin aspart  0-9 Units Subcutaneous TID WC   metoprolol tartrate  100 mg Oral BID   NIFEdipine  60 mg Oral Daily   sodium bicarbonate  1,300 mg Oral BID   acetaminophen, docusate sodium, hydrALAZINE, loratadine, ondansetron (ZOFRAN) IV, ondansetron (ZOFRAN) IV, polyethylene glycol, sodium phosphate  Assessment/ Plan:  [name] is a 76 y.o.  female  with past medical conditions including diabetes, CAD, hypertension, bilateral carotid artery stenosis, abdominal aorta and iliac artery aneurysm, hyperlipidemia, and chronic kidney disease stage IIIa, who was admitted to [facility] on 10/11/2021 for Hypertensive crisis [I16.9] Hypertensive urgency [I16.0] Hypertension, unspecified type [I10] Acute renal failure ([place]) [N17.9]   Acute kidney injury on chronic kidney disease stage IIIa. 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 Baseline creatinine appears to be 1.1 with GFR 58 on 06/29/2021.  AKI in the setting of malignant hypertension possibly secondary to renal artery stenosis. CT angio abdomen pelvis from February 13, 2021 shows suspected significant narrowing in both renal arteries along with large amount of calcified atherosclerotic plaque within the abdominal aorta. Appreciate vascular  surgery placing left renal artery stent On October 16 2021.  Vascular plans to attempt intervention in right renal artery and 4 to 6 weeks to minimize renal effects.        Creatinine Continues to worsen.  Patient creatinine was at around 1.3 on February 2023 and is now at 5.4.  There is a component of renal infarction       Patient had her first dialysis session yesterday on October 21, 2021       Patient required emergent dialysis yesterday       Patient to have tunneled catheter placement today   Lab Results  Component Value Date   CREATININE 3.16 (H) 10/26/2021   CREATININE 3.09 (H) 10/25/2021   CREATININE 3.71 (H) 10/24/2021    Intake/Output Summary (Last 24 hours) at 10/26/2021 0748 Last data filed at 10/26/2021 0600 Gross per 24 hour  Intake 700 ml  Output 2400 ml  Net -1700 ml   2. Hypertensive urgency with chronic kidney disease.  Blood pressure  on ED arrival 203/68 Patient blood pressure is better controlled     3. Anemia of chronic kidney disease  Lab Results  Component Value Date   HGB 8.2 (L) 10/25/2021    Patient hemoglobin remains low I asked for anemia work-up and it showed  Latest Reference Range & Units 10/22/21 12:31  Iron 28 - 170 ug/dL 29  UIBC ug/dL 223  TIBC 250 - 450 ug/dL 252  Saturation Ratios 10.4 - 31.8 % 12  Ferritin 11 - 307 ng/mL 200  Folate >5.9 ng/mL 13.7  Vitamin B12 180 - 914 pg/mL 369   patient on Epogen during dialysis  4.  Hyperkalemia.  Potassium stable   5. Chronic metabolic acidosis Patient bicarb is now better after patient has been initiated on dialysis  6. Hyponatremia Patient has low sodium secondary to AKI on CKD/inability to get rid of free water  7.Respiratory distress Patient required BiPAP Patient  dialyzed emergently Patient is now better   Plan  Patient is to have tunneled catheter placement today Will most likely dialyze patient tomorrow morning    LOS: 12 Jazyiah Yiu s Ikey Omary 10/19/20237:48 AM

## 2021-10-26 NOTE — Progress Notes (Signed)
Dawn Ford NAME: Dawn Ford    MR#:  [id number]  DATE OF BIRTH:  1945-03-19  SUBJECTIVE:  patient feeling a lot better. Sats more than 92% on 2 L. He looked really flat. Denies any pain. No family at bedside. Discussed about dialysis permanent catheter placement with patient today.   VITALS:  Blood pressure (!) 154/57, pulse 67, temperature 97.6 F (36.4 C), temperature source Axillary, resp. rate 14, height 5\' 6"  (1.676 m), weight 85.2 kg, SpO2 95 %.  PHYSICAL EXAMINATION:   GENERAL:  76 y.o.-year-old patient lying in the bed with acute distress.  LUNGS:decreased breath sounds bilaterally, no rales +no wheezing CARDIOVASCULAR: S1, S2 normal. No murmurs,  tachycardia+ ABDOMEN: Soft, nontender, nondistended. Bowel sounds present.  EXTREMITIES: No  edema b/l.    NEUROLOGIC: nonfocal  patient is alert and awake SKIN: No obvious rash, lesion, or ulcer.   LABORATORY PANEL:  CBC Recent Labs  Lab 10/25/21 0416  WBC 9.5  HGB 8.2*  HCT 24.5*  PLT 224     Chemistries  Recent Labs  Lab 10/25/21 0416 10/26/21 0416  NA 130* 134*  K 4.1 4.1  CL 92* 97*  CO2 27 29  GLUCOSE 176* 165*  BUN 29* 24*  CREATININE 3.09* 3.16*  CALCIUM 8.2* 8.1*  MG 1.8  --     Cardiac Enzymes No results for input(s): "TROPONINI" in the last 168 hours. RADIOLOGY:  PERIPHERAL VASCULAR CATHETERIZATION  Result Date: 10/26/2021 See surgical note for result.  DG Chest Port 1 View  Result Date: 10/25/2021 CLINICAL DATA:  Shortness of breath EXAM: PORTABLE CHEST 1 VIEW COMPARISON:  Chest x-ray dated October 14th 2023 FINDINGS: Visualized cardiac and mediastinal contours are within normal limits. Increased bilateral interstitial opacities. Small bilateral pleural effusions and bibasilar atelectasis, increased when compared with the prior exam. No evidence of pneumothorax. IMPRESSION: 1. Worsened pulmonary edema. 2. Increased size of small bilateral  pleural effusions. Electronically Signed   By: Yetta Glassman M.D.   On: 10/25/2021 10:58    Assessment and Plan Dawn Ford is a 76 y.o. 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     Acute Respiratory distress 2/2 pulm edema from fluid overload --CXR 10/18 showed pulm edema, 2/2 fluid overload from MIVF given for AKI --IV lasix 40 mg x1 on 10/18 --placed on NRB --pt to get urgent HD --she had previous episode on 10/12 -- underwent ultrafiltration of 2 L yesterday with dialysis. Now on 2 L nasal cannula oxygen. Breathing much comfortably. --Patient to get permanent dialysis catheter.  Hypertensive urgency -- Likely due to renal artery stenosis. --switch amlodipine to Nifedipine 60 mg daily, per nephro rec. --cont Lopressor at increased 100 mg BID --cont oral hydralazine 100 mg 3 times daily --cont clonidine 0.2 mg at increased TID --bp now better   Bilateral Renal artery stenosis  --10/9--s/p left renal artery stent placement --10/10-- CT abdomen pelvis done without contrast shows perinephric stranding likley due to  mild infarction post procedure causing abdominal pain. -- Discussed with Dr. Lucky Cowboy, recommends continue PRN pain meds and supportive care   Acute renal failure superimposed on stage 3a chronic kidney disease ([place]) Possibly due to dehydration and continuation for ramipril and HCTZ.  And later due to renal infarct during stent placement. --nephro consulted, started on temp dialysis on 10/14 --  Hold ramipril and HCTZ --iHD per nephro --pt will get PermCath once stable from resp standpoint   Metabolic acidosis --due to kidney failure --cont oral bicarb (new)    Coronary artery disease Chest pain and history of CAD: Her chest pain has resolved.  Troponin negative x2. -Blood pressure control as above -As needed  nitroglycerin  -Continue aspirin, Lipitor, Toprol   Hyperlipemia - Lipitor   Type II diabetes mellitus with renal manifestations (HCC) Recent A1c 6.0, well controlled.  Patient was on Victoza, Actos and Amaryl PTA. 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 Depending on that outpt HD will be set up. TOC for d/c planning     DVT prophylaxis: Heparin SQ Code Status: Full code  Family Communication: spoke with brother  Level of care: Med-Surg Dispo:   The patient is from: home EDD unknown    P TOTAL TIME TAKING CARE OF THIS PATIENT:45 minutes.  >50% time spent on counselling and coordination of care  Note: This dictation was prepared with Dragon dictation along with smaller phrase technology. Any transcriptional errors that result from this process are unintentional.  Fritzi Mandes M.D    [facility]   CC: Primary care physician; Idelle Crouch, MD

## 2021-10-26 NOTE — Progress Notes (Signed)
   10/25/21 1411 10/25/21 1415 10/25/21 1430  Vitals  BP  --  (!) 174/74 (!) 175/65  MAP (mmHg)  --  103 96  BP Location  --  Right Arm Right Arm  BP Method  --  Automatic Automatic  Patient Position (if appropriate)  --  Lying Lying  Pulse Rate 62 68 67  Pulse Rate Source  --  Monitor Monitor  ECG Heart Rate 62 68 67  Resp 13 13 14   During Treatment Monitoring  Blood Flow Rate (mL/min) 400 mL/min 400 mL/min 400 mL/min  Arterial Pressure (mmHg) -140 mmHg -150 mmHg -150 mmHg  Venous Pressure (mmHg) 220 mmHg 210 mmHg 200 mmHg  TMP (mmHg) 2 mmHg 4 mmHg 4 mmHg  Ultrafiltration Rate (mL/min) 708 mL/min 874 mL/min 874 mL/min  Dialysate Flow Rate (mL/min) 300 ml/min 300 ml/min 300 ml/min  HD Safety Checks Performed Yes Yes Yes  Intra-Hemodialysis Comments Tx initiated Progressing as prescribed Progressing as prescribed    10/25/21 1445 10/25/21 1500 10/25/21 1515  Vitals  BP (!) 173/62 (!) 163/59 (!) 167/63  MAP (mmHg) 93 90 92  BP Location Right Arm Right Arm Right Arm  BP Method Automatic Automatic Automatic  Patient Position (if appropriate) Lying Lying Lying  Pulse Rate 68 66 61  Pulse Rate Source Monitor Monitor Monitor  ECG Heart Rate 67 65 62  Resp 15 14 13   During Treatment Monitoring  Blood Flow Rate (mL/min) 400 mL/min 400 mL/min 400 mL/min  Arterial Pressure (mmHg) -160 mmHg -160 mmHg -150 mmHg  Venous Pressure (mmHg) 210 mmHg 210 mmHg 210 mmHg  TMP (mmHg) 4 mmHg 4 mmHg 4 mmHg  Ultrafiltration Rate (mL/min) 874 mL/min 874 mL/min 874 mL/min  Dialysate Flow Rate (mL/min) 300 ml/min 300 ml/min 300 ml/min  HD Safety Checks Performed Yes Yes Yes  Intra-Hemodialysis Comments Progressing as prescribed Progressing as prescribed Progressing as prescribed    10/25/21 1530  Vitals  BP (!) 171/65  MAP (mmHg) 95  BP Location Right Arm  BP Method Automatic  Patient Position (if appropriate) Lying  Pulse Rate 63  Pulse Rate Source Monitor  ECG Heart Rate 64  Resp 15  During  Treatment Monitoring  Blood Flow Rate (mL/min) 400 mL/min  Arterial Pressure (mmHg) -160 mmHg  Venous Pressure (mmHg) 210 mmHg  TMP (mmHg) 4 mmHg  Ultrafiltration Rate (mL/min) 874 mL/min  Dialysate Flow Rate (mL/min) 300 ml/min  HD Safety Checks Performed Yes  Intra-Hemodialysis Comments Progressing as prescribed

## 2021-10-27 ENCOUNTER — Encounter: Payer: Self-pay | Admitting: Vascular Surgery

## 2021-10-27 DIAGNOSIS — I160 Hypertensive urgency: Secondary | ICD-10-CM | POA: Diagnosis not present

## 2021-10-27 LAB — BASIC METABOLIC PANEL
Anion gap: 14 (ref 5–15)
BUN: 38 mg/dL — ABNORMAL HIGH (ref 8–23)
CO2: 27 mmol/L (ref 22–32)
Calcium: 8.3 mg/dL — ABNORMAL LOW (ref 8.9–10.3)
Chloride: 88 mmol/L — ABNORMAL LOW (ref 98–111)
Creatinine, Ser: 4.08 mg/dL — ABNORMAL HIGH (ref 0.44–1.00)
GFR, Estimated: 11 mL/min — ABNORMAL LOW (ref >60)
Glucose, Bld: 190 mg/dL — ABNORMAL HIGH (ref 70–99)
Potassium: 4 mmol/L (ref 3.5–5.1)
Sodium: 129 mmol/L — ABNORMAL LOW (ref 135–145)

## 2021-10-27 LAB — GLUCOSE, CAPILLARY
Glucose-Capillary: 197 mg/dL — ABNORMAL HIGH (ref 70–99)
Glucose-Capillary: 173 mg/dL — ABNORMAL HIGH (ref 70–99)
Glucose-Capillary: 180 mg/dL — ABNORMAL HIGH (ref 70–99)
Glucose-Capillary: 264 mg/dL — ABNORMAL HIGH (ref 70–99)

## 2021-10-27 MED ORDER — CARVEDILOL 25 MG PO TABS
25.0000 mg | ORAL_TABLET | Freq: Two times a day (BID) | ORAL | Status: DC
  Administered 2021-10-27 – 2021-10-30 (×6): 25 mg via ORAL
  Filled 2021-10-27 (×6): qty 1

## 2021-10-27 MED ORDER — HEPARIN SODIUM (PORCINE) 1000 UNIT/ML IJ SOLN
INTRAMUSCULAR | Status: AC
  Filled 2021-10-27: qty 10

## 2021-10-27 NOTE — Plan of Care (Signed)

## 2021-10-27 NOTE — Progress Notes (Signed)
Hemodialysis Note  Received patient in bed to unit. Alert and oriented. Informed consent signed and in chart.   Treatment initiated:0817 Treatment completed:1155  Patient tolerated treatment well. Transported back to the room alert, without acute distress. Report given to patient's RN.  Access used: RIJ Catheter  Access issues:None   Total UF removed:1500 ml Medications given: None  Post HD VS: Stable  Post HD weight: 85 kg   Patient expresses concern over condition post treatment, concerned that she would not be able to return to activities she previously enjoyed. Patient was encouraged to by this writer and NP to continue to push forward, participate in PT, and remain with positive outlook. Patient appreciative of encouragement.   Forrest Moron, RN [facility]

## 2021-10-27 NOTE — Progress Notes (Signed)
PT Cancellation Note  Patient Details Name: Dawn Ford MRN: [id number] DOB: 1945-05-08   Cancelled Treatment:    Reason Eval/Treat Not Completed: Patient at procedure or test/unavailable.  PT consult received.  Pt currently at dialysis.  Will re-attempt PT evaluation at a later date/time.  Leitha Bleak, PT 10/27/21, 9:36 AM

## 2021-10-27 NOTE — Plan of Care (Signed)
  Problem: Metabolic: Goal: Ability to maintain appropriate glucose levels will improve Outcome: Progressing   Problem: Education: Goal: Knowledge of General Education information will improve Description: Including pain rating scale, medication(s)/side effects and non-pharmacologic comfort measures Outcome: Progressing   Problem: Clinical Measurements: Goal: Will remain free from infection Outcome: Progressing   Problem: Clinical Measurements: Goal: Diagnostic test results will improve Outcome: Progressing

## 2021-10-27 NOTE — Progress Notes (Signed)
Temporary HD catheter was not in place upon return from dialysis. Pt currently has a gauze and tegaderm on left groin. Upon some review, it was charted catheter was removed 9/19 at 1212 by RN.

## 2021-10-27 NOTE — Progress Notes (Addendum)
[facility]  ROUNDING NOTE   Subjective:    Patient seen and evaluated during dialysis    HEMODIALYSIS FLOWSHEET:  Blood Flow Rate (mL/min): 400 mL/min Arterial Pressure (mmHg): -160 mmHg Venous Pressure (mmHg): 160 mmHg TMP (mmHg): 1 mmHg Ultrafiltration Rate (mL/min): 645 mL/min Dialysate Flow Rate (mL/min): 300 ml/min Dialysis Fluid Bolus: Normal Saline Bolus Amount (mL): 300 mL  Seated in chair for treatment, tolerated well. Remains discouraged and depressed  No physical complaints at this time   Objective:  Vital signs in last 24 hours:  Temp:  [97.9 F (36.6 C)-98.8 F (37.1 C)] 98.3 F (36.8 C) (10/20 1302) Pulse Rate:  [61-79] 67 (10/20 1302) Resp:  [12-19] 16 (10/20 1302) BP: (103-180)/(46-101) 167/57 (10/20 1302) SpO2:  [92 %-99 %] 92 % (10/20 1302) Weight:  [85 kg-86.9 kg] 85 kg (10/20 1142)  Weight change:  Filed Weights   10/24/21 0846 10/27/21 0812 10/27/21 1142  Weight: 85.2 kg 86.9 kg 85 kg    Intake/Output: I/O last 3 completed shifts: In: 2229 [P.O.:1460] Out: 1000 [Urine:1000]   Intake/Output this shift:  Total I/O In: -  Out: 1500 [Other:1500]  Physical Exam: General: NAD  Head: Normocephalic, atraumatic. Moist oral mucosal membranes  Eyes: Anicteric  Lungs:  Moderate respiratory distress, decreased breath sounds at bases  Heart: Regular rate and rhythm  Abdomen:  Soft, nontender  Extremities:  1+ peripheral edema.  Neurologic: Nonfocal, moving all four extremities  Skin: No lesions  Access: Tunelled HD catheter in situ    Basic Metabolic Panel: Recent Labs  Lab 10/21/21 0526 10/21/21 1900 10/22/21 0642 10/23/21 0434 10/24/21 0513 10/25/21 0416 10/26/21 0416 10/27/21 0532  NA 128* 131* 128* 129* 132* 130* 134* 129*  K 4.6 3.8 4.2 4.3 4.3 4.1 4.1 4.0  CL 100 96* 96* 94* 95* 92* 97* 88*  CO2 17* 24 22 24 25 27 29 27   GLUCOSE 169* 129* 184* 169* 150* 176* 165* 190*  BUN 70* 46* 51* 59* 39* 29* 24* 38*   CREATININE 5.37* 3.38* 4.71* 5.18* 3.71* 3.09* 3.16* 4.08*  CALCIUM 8.4* 8.2* 8.3* 8.4* 8.5* 8.2* 8.1* 8.3*  MG 2.2  --  2.0 2.4 1.9 1.8  --   --   PHOS  --  2.7  --   --   --   --   --   --      Liver Function Tests: Recent Labs  Lab 10/21/21 1900  ALBUMIN 3.1*    No results for input(s): "LIPASE", "AMYLASE" in the last 168 hours. No results for input(s): "AMMONIA" in the last 168 hours.  CBC: Recent Labs  Lab 10/21/21 1900 10/22/21 0642 10/23/21 0434 10/24/21 0513 10/25/21 0416  WBC 12.0* 9.2 8.0 8.0 9.5  HGB 8.4* 8.8* 8.3* 8.2* 8.2*  HCT 24.4* 25.7* 24.4* 24.3* 24.5*  MCV 81.6 82.1 83.8 85.9 83.9  PLT 181 214 218 220 224     Cardiac Enzymes: No results for input(s): "CKTOTAL", "CKMB", "CKMBINDEX", "TROPONINI" in the last 168 hours.  BNP: Invalid input(s): "POCBNP"  CBG: Recent Labs  Lab 10/26/21 0846 10/26/21 1118 10/26/21 1644 10/26/21 2144 10/27/21 0734  GLUCAP 158* 140* 227* 217* 197*     Microbiology: Results for orders placed or performed during the hospital encounter of 10/11/21  MRSA Next Gen by PCR, Nasal     Status: None   Collection Time: 10/25/21  5:52 PM   Specimen: Nasal Mucosa; Nasal Swab  Result Value Ref Range Status   MRSA by PCR  Next Gen NOT DETECTED NOT DETECTED Final    Comment: (NOTE) The GeneXpert MRSA Assay (FDA approved for NASAL specimens only), is one component of a comprehensive MRSA colonization surveillance program. It is not intended to diagnose MRSA infection nor to guide or monitor treatment for MRSA infections. Test performance is not FDA approved in patients less than 68 years old. Performed at [facility], [address] [place], [place] [address]     Coagulation Studies: No results for input(s): "LABPROT", "INR" in the last 72 hours.   Urinalysis: No results for input(s): "COLORURINE", "LABSPEC", "PHURINE", "GLUCOSEU", "HGBUR", "BILIRUBINUR", "KETONESUR", "PROTEINUR", "UROBILINOGEN",  "NITRITE", "LEUKOCYTESUR" in the last 72 hours.  Invalid input(s): "APPERANCEUR"    Imaging: PERIPHERAL VASCULAR CATHETERIZATION  Result Date: 10/26/2021 See surgical note for result.    Medications:       aspirin EC  81 mg Oral Daily   atorvastatin  40 mg Oral QHS   bisacodyl  10 mg Rectal Daily   carvedilol  25 mg Oral BID WC   Chlorhexidine Gluconate Cloth  6 each Topical Q0600   cloNIDine  0.3 mg Oral TID   clopidogrel  75 mg Oral Daily   epoetin (EPOGEN/PROCRIT) injection  4,000 Units Intravenous Q T,Th,Sa-HD   famotidine  20 mg Oral QHS   heparin  5,000 Units Subcutaneous Q8H   heparin sodium (porcine)       hydrALAZINE  100 mg Oral TID   insulin aspart  0-5 Units Subcutaneous QHS   insulin aspart  0-9 Units Subcutaneous TID WC   NIFEdipine  60 mg Oral Daily   acetaminophen, ALPRAZolam, docusate sodium, heparin sodium (porcine), hydrALAZINE, loratadine, ondansetron (ZOFRAN) IV, ondansetron (ZOFRAN) IV, polyethylene glycol, sodium phosphate  Assessment/ Plan:  Ms. LEXINGTON DEVINE is a 76 y.o.  female  with past medical conditions including diabetes, CAD, hypertension, bilateral carotid artery stenosis, abdominal aorta and iliac artery aneurysm, hyperlipidemia, and chronic kidney disease stage IIIa, who was admitted to [facility] on 10/11/2021 for Hypertensive crisis [I16.9] Hypertensive urgency [I16.0] Hypertension, unspecified type [I10] Acute renal failure ([place]) [N17.9]   Acute kidney injury on chronic kidney disease stage IIIa. 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 Baseline creatinine appears to be 1.1 with GFR 58 on 06/29/2021.  AKI in the setting of malignant hypertension possibly secondary to renal artery stenosis. CT angio abdomen pelvis from February 13, 2021 shows suspected significant narrowing in both renal arteries along with large amount of calcified atherosclerotic plaque within the abdominal aorta. Appreciate vascular surgery placing left renal artery  stent On October 16 2021.  Vascular plans to attempt intervention in right renal artery and 4 to 6 weeks to minimize renal effects.        Renal function remains decreased. Receiving dialysis today, UF goal 1.5 as tolerated. Urine output recorded of 693ml on day shift yesterday. Will offer dialysis treatment tomorrow. Order placed to remove HD temp cath.    Lab Results  Component Value Date   CREATININE 4.08 (H) 10/27/2021   CREATININE 3.16 (H) 10/26/2021   CREATININE 3.09 (H) 10/25/2021    Intake/Output Summary (Last 24 hours) at 10/27/2021 1304 Last data filed at 10/27/2021 1142 Gross per 24 hour  Intake 480 ml  Output 1500 ml  Net -1020 ml    2. Hypertensive urgency with chronic kidney disease.  Blood pressure on ED arrival 203/68 Patient blood pressure stable     3. Anemia of chronic kidney disease  Lab Results  Component Value Date  HGB 8.2 (L) 10/25/2021    Patient hemoglobin remains low I asked for anemia work-up and it showed  Latest Reference Range & Units 10/22/21 12:31  Iron 28 - 170 ug/dL 29  UIBC ug/dL 223  TIBC 250 - 450 ug/dL 252  Saturation Ratios 10.4 - 31.8 % 12  Ferritin 11 - 307 ng/mL 200  Folate >5.9 ng/mL 13.7  Vitamin B12 180 - 914 pg/mL 369   Hgb remains below target.   4.  Hyperkalemia.  Potassium stable   5. Chronic metabolic acidosis Improved with dialysis  6. Hyponatremia Sodium 129, will correct with increased oral intake and dialysis.   7.Respiratory distress Weaned to room air   Plan  Patient is to have tunneled catheter placement today Will most likely dialyze patient tomorrow morning    LOS: [place] 10/20/20231:04 PM    I saw and evaluated the patient with Colon Flattery, NP.  I personally formulated the plan of care.  I agree with the findings and plan as documented in the note except as noted .

## 2021-10-27 NOTE — Progress Notes (Signed)
PROGRESS NOTE    Dawn Ford  ALP:[id number] DOB: September 01, 1945 DOA: 10/11/2021 PCP: Idelle Crouch, MD  110A/110A-AA  LOS: 13 days   Brief hospital course:   Assessment & Plan: Dawn Ford is a 76 y.o. 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    Patient states that her blood pressure has not been controlled well recently.  She had elevated blood pressure up to SBP 200.  Her cardiologist added amlodipine 5 mg to home regimen (including ramipril, HCTZ and metoprolol).     Respiratory distress 2/2 pulm edema from fluid overload --CXR 10/12 showed pulm edema, 2/2 fluid overload from MIVF given for AKI --IV lasix 40 mg x1 on 10/12 and 10/18. --dialysis for fluid removal  Hypertensive urgency Initially blood pressure 203/68.  BP meds increased but BP still not controlled.  Likely due to renal artery stenosis. --cont Nifedipine 60 mg daily (new) --switch from Lopressor to coreg 25 mg BID for more BP control --cont oral hydralazine 100 mg 3 times daily --cont clonidine 0.2 mg at increased TID   Bilateral Renal artery stenosis  --10/9--s/p left renal artery stent placement --10/10-- CT abdomen pelvis done without contrast shows perinephric stranding likley due to  mild infarction post procedure causing abdominal pain. -- Discussed with Dr. Lucky Cowboy, recommends continue PRN pain meds and supportive care  Acute renal failure superimposed on stage 3a chronic kidney disease (Dawn Ford) Now ESRD Possibly due to dehydration and continuation for ramipril and HCTZ.  And later due to renal infarct during stent placement. --nephro consulted, started on temp dialysis on 10/14, PermCath placed on 10/19 Plan: --Hold ramipril and HCTZ --iHD per nephro --need to set up outpatient dialysis  Metabolic acidosis --due to kidney failure --received NaBicarb  with resolution.  NaBicarb since d/c'ed.  Coronary artery disease Chest pain and history of CAD:  Her chest pain has resolved.  Troponin negative x2. -Blood pressure control as above -As needed nitroglycerin  -Continue aspirin, Lipitor, Toprol  Hyperlipemia - Lipitor   Type II diabetes mellitus with renal manifestations (HCC) Recent A1c 6.0, well controlled.  Patient was on Victoza, Actos and Amaryl PTA. 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  Pt had a lot of questions about outpatient dialysis.  Waiting on PT eval.    Objective: Vitals:   10/27/21 1130 10/27/21 1142 10/27/21 1201 10/27/21 1302  BP: (!) 170/59 (!) 168/55 (!) 180/66 (!) 167/57  Pulse: 67 67 79 67  Resp: 14 13 18 16   Temp:    98.3 F (36.8 C)  TempSrc:      SpO2: 95% 95% 95% 92%  Weight:  85 kg    Height:        Intake/Output Summary (Last 24 hours) at 10/27/2021 1522 Last data filed at  10/27/2021 1319 Gross per 24 hour  Intake 480 ml  Output 1800 ml  Net -1320 ml   Filed Weights   10/24/21 0846 10/27/21 0812 10/27/21 1142  Weight: 85.2 kg 86.9 kg 85 kg    Examination:   Constitutional: NAD, AAOx3 HEENT: conjunctivae and lids normal, EOMI CV: No cyanosis.   RESP: normal respiratory effort Neuro: II - XII grossly intact.   Psych: flat mood and affect.     Data Reviewed: I have personally  reviewed labs and imaging studies  Time spent: 25 minutes  Enzo Bi, MD [facility] If 7PM-7AM, please contact night-coverage 10/27/2021, 3:22 PM

## 2021-10-28 ENCOUNTER — Inpatient Hospital Stay: Payer: Medicare Other

## 2021-10-28 DIAGNOSIS — I160 Hypertensive urgency: Secondary | ICD-10-CM | POA: Diagnosis not present

## 2021-10-28 LAB — CBC
HCT: 25 % — ABNORMAL LOW (ref 36.0–46.0)
Hemoglobin: 8.3 g/dL — ABNORMAL LOW (ref 12.0–15.0)
MCH: 28.2 pg (ref 26.0–34.0)
MCHC: 33.2 g/dL (ref 30.0–36.0)
MCV: 85 fL (ref 80.0–100.0)
Platelets: 225 10*3/uL (ref 150–400)
RBC: 2.94 MIL/uL — ABNORMAL LOW (ref 3.87–5.11)
RDW: 12.6 % (ref 11.5–15.5)
WBC: 13.7 10*3/uL — ABNORMAL HIGH (ref 4.0–10.5)
nRBC: 0 % (ref 0.0–0.2)

## 2021-10-28 LAB — MAGNESIUM: Magnesium: 1.8 mg/dL (ref 1.7–2.4)

## 2021-10-28 LAB — BASIC METABOLIC PANEL
Anion gap: 10 (ref 5–15)
BUN: 25 mg/dL — ABNORMAL HIGH (ref 8–23)
CO2: 26 mmol/L (ref 22–32)
Calcium: 8.4 mg/dL — ABNORMAL LOW (ref 8.9–10.3)
Chloride: 96 mmol/L — ABNORMAL LOW (ref 98–111)
Creatinine, Ser: 3.2 mg/dL — ABNORMAL HIGH (ref 0.44–1.00)
GFR, Estimated: 14 mL/min — ABNORMAL LOW (ref >60)
Glucose, Bld: 205 mg/dL — ABNORMAL HIGH (ref 70–99)
Potassium: 3.9 mmol/L (ref 3.5–5.1)
Sodium: 132 mmol/L — ABNORMAL LOW (ref 135–145)

## 2021-10-28 LAB — GLUCOSE, CAPILLARY
Glucose-Capillary: 203 mg/dL — ABNORMAL HIGH (ref 70–99)
Glucose-Capillary: 208 mg/dL — ABNORMAL HIGH (ref 70–99)
Glucose-Capillary: 105 mg/dL — ABNORMAL HIGH (ref 70–99)
Glucose-Capillary: 252 mg/dL — ABNORMAL HIGH (ref 70–99)

## 2021-10-28 MED ORDER — HEPARIN SODIUM (PORCINE) 1000 UNIT/ML IJ SOLN
INTRAMUSCULAR | Status: AC
  Filled 2021-10-28: qty 10

## 2021-10-28 MED ORDER — FUROSEMIDE 10 MG/ML IJ SOLN
80.0000 mg | Freq: Once | INTRAMUSCULAR | Status: AC
  Administered 2021-10-28: 80 mg via INTRAVENOUS
  Filled 2021-10-28: qty 8

## 2021-10-28 MED ORDER — EPOETIN ALFA 4000 UNIT/ML IJ SOLN
INTRAMUSCULAR | Status: AC
  Administered 2021-10-28: 4000 [IU]
  Filled 2021-10-28: qty 1

## 2021-10-28 NOTE — Progress Notes (Signed)
Post hd rn assessment 

## 2021-10-28 NOTE — Progress Notes (Signed)
Catheter lines reversed d/t high AP. BFR 400.

## 2021-10-28 NOTE — Progress Notes (Signed)
[facility]  PROGRESS NOTE   Subjective:   Patient is now comfortable.  Seen on dialysis. Right permacath works well.  Objective:  Vital signs: Blood pressure (!) 180/57, pulse 73, temperature 98.4 F (36.9 C), temperature source Oral, resp. rate 14, height 5\' 6"  (1.676 m), weight 85 kg, SpO2 96 %. No intake or output data in the 24 hours ending 10/28/21 1419 Filed Weights   10/24/21 0846 10/27/21 0812 10/27/21 1142  Weight: 85.2 kg 86.9 kg 85 kg     Physical Exam: General:  No acute distress  Head:  Normocephalic, atraumatic. Moist oral mucosal membranes  Eyes:  Anicteric  Neck:  Supple  Lungs:   Clear to auscultation, normal effort  Heart:  S1S2 no rubs  Abdomen:   Soft, nontender, bowel sounds present  Extremities:  peripheral edema.  Neurologic:  Awake, alert, following commands  Skin:  No lesions  Access:     Basic Metabolic Panel: Recent Labs  Lab 10/21/21 1900 10/22/21 0642 10/23/21 0434 10/24/21 0513 10/25/21 0416 10/26/21 0416 10/27/21 0532 10/28/21 0504  NA 131* 128* 129* 132* 130* 134* 129* 132*  K 3.8 4.2 4.3 4.3 4.1 4.1 4.0 3.9  CL 96* 96* 94* 95* 92* 97* 88* 96*  CO2 24 22 24 25 27 29 27 26   GLUCOSE 129* 184* 169* 150* 176* 165* 190* 205*  BUN 46* 51* 59* 39* 29* 24* 38* 25*  CREATININE 3.38* 4.71* 5.18* 3.71* 3.09* 3.16* 4.08* 3.20*  CALCIUM 8.2* 8.3* 8.4* 8.5* 8.2* 8.1* 8.3* 8.4*  MG  --  2.0 2.4 1.9 1.8  --   --  1.8  PHOS 2.7  --   --   --   --   --   --   --     CBC: Recent Labs  Lab 10/22/21 0642 10/23/21 0434 10/24/21 0513 10/25/21 0416 10/28/21 0504  WBC 9.2 8.0 8.0 9.5 13.7*  HGB 8.8* 8.3* 8.2* 8.2* 8.3*  HCT 25.7* 24.4* 24.3* 24.5* 25.0*  MCV 82.1 83.8 85.9 83.9 85.0  PLT 214 218 220 224 225     Urinalysis: No results for input(s): "COLORURINE", "LABSPEC", "PHURINE", "GLUCOSEU", "HGBUR", "BILIRUBINUR", "KETONESUR", "PROTEINUR", "UROBILINOGEN", "NITRITE", "LEUKOCYTESUR" in the last 72 hours.  Invalid  input(s): "APPERANCEUR"    Imaging: No results found.   Medications:     aspirin EC  81 mg Oral Daily   atorvastatin  40 mg Oral QHS   bisacodyl  10 mg Rectal Daily   carvedilol  25 mg Oral BID WC   Chlorhexidine Gluconate Cloth  6 each Topical Q0600   cloNIDine  0.3 mg Oral TID   clopidogrel  75 mg Oral Daily   epoetin alfa       epoetin (EPOGEN/PROCRIT) injection  4,000 Units Intravenous Q T,Th,Sa-HD   famotidine  20 mg Oral QHS   heparin  5,000 Units Subcutaneous Q8H   heparin sodium (porcine)       hydrALAZINE  100 mg Oral TID   insulin aspart  0-5 Units Subcutaneous QHS   insulin aspart  0-9 Units Subcutaneous TID WC   NIFEdipine  60 mg Oral Daily    Assessment/ Plan:     Principal Problem:   Hypertensive urgency Active Problems:   Bilateral carotid artery stenosis   Iliac artery aneurysm (HCC)   Acute renal failure superimposed on stage 3a chronic kidney disease (HCC)   Coronary artery disease   Hyperlipemia   Type II diabetes mellitus with renal manifestations ([place])  Chest pain   Ectatic abdominal aorta (HCC)   Acute renal failure ([place])  [name] is a 76 y.o.  female  with past medical conditions including diabetes, CAD, hypertension, bilateral carotid artery stenosis, abdominal aorta and iliac artery aneurysm, hyperlipidemia, and chronic kidney disease stage IIIa, who was admitted to [facility] on 10/11/2021 for Hypertensive crisis [I16.9] Hypertensive urgency [I16.0] Hypertension, unspecified type [I10]  #1: Acute kidney injury on chronic kidney disease:.  She was initiated on dialysis presently tolerating well.  Right chest wall permacath works well.  #2: Bilateral renal artery stenosis: Patient s/p left renal artery stent.  Possible intervention to the right renal artery also in the future.  #3: Hypertension: She is presently on nifedipine, hydralazine, clonidine and carvedilol.  Advised on importance of 2 g salt restricted diet.  Possible renal artery  intervention in the next few weeks.  #4: Anemia: We will continue to monitor closely.  We will continue to monitor closely.   LOS: [place], [facility] 10/21/20232:19 PM

## 2021-10-28 NOTE — Progress Notes (Signed)
Pt c/o SOB, oxygen saturation  noted to be 87% on RA. Increased to a total of 2 L/[place] to maintain O2 saturaton of 93%. Sharion Settler NP messaged above  information.

## 2021-10-28 NOTE — Progress Notes (Signed)
PROGRESS NOTE    Dawn Ford  LOV:[id number] DOB: March 31, 1945 DOA: 10/11/2021 PCP: Idelle Crouch, MD  110A/110A-AA  LOS: 14 days   Brief hospital course:   Assessment & Plan: Dawn Ford is a 76 y.o. 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    Patient states that her blood pressure has not been controlled well recently.  She had elevated blood pressure up to SBP 200.  Her cardiologist added amlodipine 5 mg to home regimen (including ramipril, HCTZ and metoprolol).     Respiratory distress 2/2 pulm edema from fluid overload --CXR 10/12 showed pulm edema, 2/2 fluid overload from MIVF given for AKI --IV lasix 40 mg x1 on 10/12 and 10/18. --dialysis for fluid removal  Hypertensive urgency Initially blood pressure 203/68.  BP meds increased but BP still not controlled.  Likely due to renal artery stenosis. --cont Nifedipine 60 mg daily (new) --cont coreg 25 mg BID (replaced Lopressor) --cont oral hydralazine 100 mg 3 times daily --cont clonidine 0.2 mg at increased TID --IV hydralazine PRN   Bilateral Renal artery stenosis  --10/9--s/p left renal artery stent placement --10/10-- CT abdomen pelvis done without contrast shows perinephric stranding likley due to  mild infarction post procedure causing abdominal pain. -- Discussed with Dr. Lucky Cowboy, recommends continue PRN pain meds and supportive care  Acute renal failure superimposed on stage 3a chronic kidney disease ([place]) Now ESRD Possibly due to dehydration and continuation for ramipril and HCTZ.  And later due to renal infarct during stent placement. --nephro consulted, started on temp dialysis on 10/14, PermCath placed on 10/19 Plan: --iHD per nephro --need to set up outpatient dialysis  Metabolic acidosis --due to kidney failure --received NaBicarb with resolution.   NaBicarb since d/c'ed.  Coronary artery disease Chest pain and history of CAD:  Her chest pain has resolved.  Troponin negative x2. -Blood pressure control as above -As needed nitroglycerin  -Continue aspirin, Lipitor, Toprol  Hyperlipemia - Lipitor   Type II diabetes mellitus with renal manifestations (HCC) Recent A1c 6.0, well controlled.  Patient was on Victoza, Actos and Amaryl PTA. --SSI while inpatient    H/o Right common iliac artery aneurysm  with increased growth and risk for lethal rupture; atherosclerotic occlusive disease of the right iliac arteries with rest pain; small abdominal aortic aneurysm s/p coil embolization right iliiac artery and stent placement right common iliac artery-- feb 2023    DVT prophylaxis: Heparin SQ Code Status: Full code  Family Communication:  Level of care: Telemetry Medical Dispo:   The patient is from: home Anticipated d/c is to: to be determined after PT eval Anticipated d/c date is: once outpatient dialysis is set up   Subjective and Interval History:  Pt had respiratory distress and hypoxia early this morning.  Went for extra dialysis session today.   Objective: Vitals:   10/28/21 1608 10/28/21 1616 10/28/21 1652 10/28/21 1722  BP: (!) 197/65 (!) 186/60 (!) 187/59 (!) 187/59  Pulse: 96 84    Resp: 17 17    Temp: 97.8 F (36.6 C) 98 F (36.7 C)    TempSrc:      SpO2: 91% 97%  98%  Weight:      Height:        Intake/Output Summary (Last 24 hours) at 10/28/2021 2028 Last data filed at 10/28/2021 1540 Gross per 24 hour  Intake --  Output 500 ml  Net -500 ml   Filed Weights   10/27/21 0812 10/27/21 1142 10/28/21 1548  Weight: 86.9 kg 85 kg 81.4 kg    Examination:   Constitutional: NAD, AAOx3 HEENT: conjunctivae and lids normal, EOMI CV: No cyanosis.   RESP: normal respiratory effort Neuro: II - XII grossly intact.   Psych: depressed mood and affect.     Data Reviewed: I have personally reviewed labs and  imaging studies  Time spent: 25 minutes  Dawn Bi, MD [facility] If 7PM-7AM, please contact night-coverage 10/28/2021, 8:28 PM

## 2021-10-28 NOTE — Significant Event (Signed)
[id number] patient called out stating she was SOB.  RN assessment noted increase in respirations, and bilateral breath sounds with increase wetness.  VS note dto be 230/68, 99% on 4 Lnc , RR 36 , and HR 102. 0450 RRT called. AC and ICU CN at bedside. MEWS Red 0452 Respiratory in room . Sharion Settler  NP and ICU charge nurse discussing next steps.  Patient to have HD today.   AC or ICU RN will speak to dialysis team to determine how soon HD can be started.

## 2021-10-28 NOTE — Progress Notes (Signed)
2000 patient alert abl;e to make all needs known dangling at side of bed doing leg exercises 2017 patient complains of SOB SP02 97% on RA patient education on to much exercise patient now laying down in bed with feet elevated education on fluids will provide ice chips patient reports feeling better in less than 3 minutes

## 2021-10-28 NOTE — Progress Notes (Signed)
Pt delay to HD d/t procedure.

## 2021-10-28 NOTE — Plan of Care (Signed)

## 2021-10-28 NOTE — Evaluation (Signed)
Physical Therapy Evaluation Patient Details Name: Dawn Ford MRN: [id number] DOB: 1945/05/17 Today's Date: 10/28/2021  History of Present Illness  Dawn Ford is a 76 y.o. female with medical history significant of HTN,  HLD, DM, CAD, NSTEMI, bilateral carotid artery stenosis, CKD-3A, cctatic abdominal aorta and iliac artery aneurysm, who presents with chest pain from respiratory distress due to fluid overload, hypertensive urgency, bilateral renal artery stenosis,  acute renal failure superimposed on stage 3 chronic kidney disease, and metabolic acidosis.  Clinical Impression  Pt presents A&Ox4 seated in bed with niece present and agreeable to PT. She exhibits decreased activity tolerance, mobility, and dynamic balance with session limited due to fatigue. Session was conducted after long day of dialysis, so this is likely contributing to pt fatigue. She needs to use BUE support with use of 2WW while ambulating which is an increase in her level of support from her baseline where she does not use an AD. Pt was hemodynamically stable throughout bout of walking with lowest SpO2 reading at 91% and HR remaining around 110. She also did not report any symptoms such as shortness of breath or chest pain. Pt will benefit from further skilled PT to improve dynamic balance and activity tolerance to remain living independently. PT recommends discharge home with additional HHPT.      Recommendations for follow up therapy are one component of a multi-disciplinary discharge planning process, led by the attending physician.  Recommendations may be updated based on patient status, additional functional criteria and insurance authorization.  Follow Up Recommendations Home health PT      Assistance Recommended at Discharge PRN  Patient can return home with the following  Assist for transportation;Assistance with cooking/housework    Equipment Recommendations Rolling walker (2 wheels)  Recommendations for  Other Services  OT consult    Functional Status Assessment Patient has had a recent decline in their functional status and demonstrates the ability to make significant improvements in function in a reasonable and predictable amount of time.     Precautions / Restrictions Precautions Precautions: None Restrictions Weight Bearing Restrictions: No      Mobility  Bed Mobility Overal bed mobility: Independent                  Transfers Overall transfer level: Modified independent Equipment used: Rolling walker (2 wheels)               General transfer comment: Use of BUE support using 2WW    Ambulation/Gait Ambulation/Gait assistance: Modified independent (Device/Increase time) Gait Distance (Feet): 200 Feet Assistive device: Rolling walker (2 wheels) Gait Pattern/deviations: WFL(Within Functional Limits), Step-through pattern Gait velocity: Decreased gait speed   Pre-gait activities: Marching in place    Stairs            Wheelchair Mobility    Modified Rankin (Stroke Patients Only)       Balance Overall balance assessment: Modified Independent                                           Pertinent Vitals/Pain Pain Assessment Pain Assessment: No/denies pain    Home Living Family/patient expects to be discharged to:: Private residence Living Arrangements: Alone Available Help at Discharge: Friend(s);Family;Neighbor;Available PRN/intermittently Type of Home: Apartment Home Access: Elevator       Home Layout: One level Home Equipment: Cane - single point;Grab bars - tub/shower  Additional Comments: Does not use AD at baseline    Prior Function Prior Level of Function : Independent/Modified Independent             Mobility Comments: Does not use AD at baseline       Hand Dominance        Extremity/Trunk Assessment   Upper Extremity Assessment Upper Extremity Assessment: Defer to OT evaluation    Lower  Extremity Assessment Lower Extremity Assessment: Overall WFL for tasks assessed    Cervical / Trunk Assessment Cervical / Trunk Assessment: Normal  Communication   Communication: No difficulties  Cognition Arousal/Alertness: Awake/alert Behavior During Therapy: WFL for tasks assessed/performed Overall Cognitive Status: Within Functional Limits for tasks assessed                                          General Comments      Exercises     Assessment/Plan    PT Assessment Patient needs continued PT services  PT Problem List Decreased activity tolerance;Decreased mobility       PT Treatment Interventions Balance training;Gait training;Therapeutic exercise;Therapeutic activities    PT Goals (Current goals can be found in the Care Plan section)  Acute Rehab PT Goals Patient Stated Goal: To return home safely PT Goal Formulation: With patient/family Time For Goal Achievement: 11/11/21 Potential to Achieve Goals: Good    Frequency Min 2X/week     Co-evaluation               AM-PAC PT "6 Clicks" Mobility  Outcome Measure Help needed turning from your back to your side while in a flat bed without using bedrails?: None Help needed moving from lying on your back to sitting on the side of a flat bed without using bedrails?: None Help needed moving to and from a bed to a chair (including a wheelchair)?: A Little Help needed standing up from a chair using your arms (e.g., wheelchair or bedside chair)?: A Little Help needed to walk in hospital room?: A Little Help needed climbing 3-5 steps with a railing? : A Lot 6 Click Score: 19    End of Session Equipment Utilized During Treatment: Gait belt Activity Tolerance: Patient tolerated treatment well Patient left: in bed;with bed alarm set;with family/visitor present;with call bell/phone within reach Nurse Communication: Mobility status PT Visit Diagnosis: Unsteadiness on feet (R26.81);Difficulty in  walking, not elsewhere classified (R26.2);Other abnormalities of gait and mobility (R26.89)    Time: 3009-2330 PT Time Calculation (min) (ACUTE ONLY): 30 min   Charges:   PT Evaluation $PT Eval Low Complexity: 1 Low PT Treatments $Therapeutic Activity: 8-22 mins       Bradly Chris PT, DPT  10/28/2021, 5:31 PM

## 2021-10-28 NOTE — Progress Notes (Signed)
Pre hd rn assessment 

## 2021-10-28 NOTE — Progress Notes (Signed)
BFR decreased to 350 d/t high ap alarms.

## 2021-10-28 NOTE — Progress Notes (Signed)
Several transport delays for patient to HD. Floor and primary RN aware. Transport requested again.

## 2021-10-29 DIAGNOSIS — I160 Hypertensive urgency: Secondary | ICD-10-CM | POA: Diagnosis not present

## 2021-10-29 LAB — CBC
HCT: 21.6 % — ABNORMAL LOW (ref 36.0–46.0)
Hemoglobin: 7.3 g/dL — ABNORMAL LOW (ref 12.0–15.0)
MCH: 29.6 pg (ref 26.0–34.0)
MCHC: 33.8 g/dL (ref 30.0–36.0)
MCV: 87.4 fL (ref 80.0–100.0)
Platelets: 195 10*3/uL (ref 150–400)
RBC: 2.47 MIL/uL — ABNORMAL LOW (ref 3.87–5.11)
RDW: 12.7 % (ref 11.5–15.5)
WBC: 10.3 10*3/uL (ref 4.0–10.5)
nRBC: 0 % (ref 0.0–0.2)

## 2021-10-29 LAB — MAGNESIUM: Magnesium: 1.6 mg/dL — ABNORMAL LOW (ref 1.7–2.4)

## 2021-10-29 LAB — BASIC METABOLIC PANEL
Anion gap: 9 (ref 5–15)
BUN: 22 mg/dL (ref 8–23)
CO2: 27 mmol/L (ref 22–32)
Calcium: 8.3 mg/dL — ABNORMAL LOW (ref 8.9–10.3)
Chloride: 98 mmol/L (ref 98–111)
Creatinine, Ser: 2.53 mg/dL — ABNORMAL HIGH (ref 0.44–1.00)
GFR, Estimated: 19 mL/min — ABNORMAL LOW (ref >60)
Glucose, Bld: 176 mg/dL — ABNORMAL HIGH (ref 70–99)
Potassium: 3.9 mmol/L (ref 3.5–5.1)
Sodium: 134 mmol/L — ABNORMAL LOW (ref 135–145)

## 2021-10-29 LAB — GLUCOSE, CAPILLARY
Glucose-Capillary: 292 mg/dL — ABNORMAL HIGH (ref 70–99)
Glucose-Capillary: 273 mg/dL — ABNORMAL HIGH (ref 70–99)
Glucose-Capillary: 296 mg/dL — ABNORMAL HIGH (ref 70–99)

## 2021-10-29 MED ORDER — PIOGLITAZONE HCL 15 MG PO TABS
7.5000 mg | ORAL_TABLET | Freq: Every day | ORAL | Status: DC
  Administered 2021-10-29 – 2021-10-30 (×2): 7.5 mg via ORAL
  Filled 2021-10-29 (×2): qty 0.5

## 2021-10-29 MED ORDER — LIRAGLUTIDE 18 MG/3ML ~~LOC~~ SOPN: 1.8000 mg | PEN_INJECTOR | Freq: Every evening | SUBCUTANEOUS | Status: DC

## 2021-10-29 MED ORDER — NYSTATIN 100000 UNIT/ML MT SUSP
5.0000 mL | Freq: Four times a day (QID) | OROMUCOSAL | Status: DC
  Administered 2021-10-29 – 2021-10-30 (×3): 500000 [IU] via ORAL
  Filled 2021-10-29 (×3): qty 5

## 2021-10-29 MED ORDER — INSULIN ASPART 100 UNIT/ML IJ SOLN
0.0000 [IU] | Freq: Three times a day (TID) | INTRAMUSCULAR | Status: DC
  Administered 2021-10-29 – 2021-10-30 (×3): 8 [IU] via SUBCUTANEOUS
  Administered 2021-10-30: 3 [IU] via SUBCUTANEOUS
  Administered 2021-10-30: 5 [IU] via SUBCUTANEOUS
  Filled 2021-10-29 (×6): qty 1

## 2021-10-29 MED ORDER — MAGNESIUM OXIDE -MG SUPPLEMENT 400 (240 MG) MG PO TABS
400.0000 mg | ORAL_TABLET | Freq: Two times a day (BID) | ORAL | Status: AC
  Administered 2021-10-29 (×2): 400 mg via ORAL
  Filled 2021-10-29 (×2): qty 1

## 2021-10-29 MED ORDER — BISACODYL 10 MG RE SUPP: 10.0000 mg | Freq: Every day | RECTAL | Status: DC | PRN

## 2021-10-29 MED ORDER — MAGNESIUM SULFATE 4 GM/100ML IV SOLN
4.0000 g | Freq: Once | INTRAVENOUS | Status: AC
  Administered 2021-10-29: 4 g via INTRAVENOUS
  Filled 2021-10-29: qty 100

## 2021-10-29 MED ORDER — GLIMEPIRIDE 2 MG PO TABS
2.0000 mg | ORAL_TABLET | Freq: Two times a day (BID) | ORAL | Status: DC
  Administered 2021-10-29 – 2021-10-30 (×4): 2 mg via ORAL
  Filled 2021-10-29 (×4): qty 1

## 2021-10-29 NOTE — Plan of Care (Signed)
  Problem: Education: Goal: Ability to describe self-care measures that may prevent or decrease complications (Diabetes Survival Skills Education) will improve Outcome: Not Progressing Goal: Individualized Educational Video(s) Outcome: Not Progressing   Problem: Coping: Goal: Ability to adjust to condition or change in health will improve Outcome: Progressing   Problem: Fluid Volume: Goal: Ability to maintain a balanced intake and output will improve Outcome: Progressing   Problem: Health Behavior/Discharge Planning: Goal: Ability to identify and utilize available resources and services will improve Outcome: Progressing Goal: Ability to manage health-related needs will improve Outcome: Progressing   Problem: Metabolic: Goal: Ability to maintain appropriate glucose levels will improve Outcome: Progressing   Problem: Nutritional: Goal: Maintenance of adequate nutrition will improve Outcome: Progressing Goal: Progress toward achieving an optimal weight will improve Outcome: Not Progressing Note: R/t HD needs patient has BLE swelling    Problem: Skin Integrity: Goal: Risk for impaired skin integrity will decrease Outcome: Progressing   Problem: Tissue Perfusion: Goal: Adequacy of tissue perfusion will improve Outcome: Progressing   Problem: Education: Goal: Knowledge of General Education information will improve Description: Including pain rating scale, medication(s)/side effects and non-pharmacologic comfort measures Outcome: Progressing   Problem: Health Behavior/Discharge Planning: Goal: Ability to manage health-related needs will improve Outcome: Progressing   Problem: Clinical Measurements: Goal: Ability to maintain clinical measurements within normal limits will improve Outcome: Progressing Goal: Will remain free from infection Outcome: Progressing Goal: Diagnostic test results will improve Outcome: Progressing Goal: Respiratory complications will  improve Outcome: Progressing Goal: Cardiovascular complication will be avoided Outcome: Progressing   Problem: Activity: Goal: Risk for activity intolerance will decrease Outcome: Progressing   Problem: Nutrition: Goal: Adequate nutrition will be maintained Outcome: Progressing   Problem: Coping: Goal: Level of anxiety will decrease Outcome: Progressing   Problem: Elimination: Goal: Will not experience complications related to bowel motility Outcome: Progressing Goal: Will not experience complications related to urinary retention Outcome: Progressing   Problem: Pain Managment: Goal: General experience of comfort will improve Outcome: Progressing   Problem: Safety: Goal: Ability to remain free from injury will improve Outcome: Progressing   Problem: Skin Integrity: Goal: Risk for impaired skin integrity will decrease Outcome: Progressing

## 2021-10-29 NOTE — Progress Notes (Signed)
[facility]  PROGRESS NOTE   Subjective:   Out of bed to chair.  Feels much better than yesterday.  Had stable dialysis yesterday.  Objective:  Vital signs: Blood pressure (!) 157/53, pulse 83, temperature 98.8 F (37.1 C), temperature source Oral, resp. rate 17, height 5\' 6"  (1.676 m), weight 81.4 kg, SpO2 93 %.  Intake/Output Summary (Last 24 hours) at 10/29/2021 1140 Last data filed at 10/28/2021 1540 Gross per 24 hour  Intake --  Output 500 ml  Net -500 ml   Filed Weights   10/27/21 0812 10/27/21 1142 10/28/21 1548  Weight: 86.9 kg 85 kg 81.4 kg     Physical Exam: General:  No acute distress  Head:  Normocephalic, atraumatic. Moist oral mucosal membranes  Eyes:  Anicteric  Neck:  Supple  Lungs:   Clear to auscultation, normal effort  Heart:  S1S2 no rubs  Abdomen:   Soft, nontender, bowel sounds present  Extremities:  peripheral edema.  Neurologic:  Awake, alert, following commands  Skin:  No lesions  Access:     Basic Metabolic Panel: Recent Labs  Lab 10/23/21 0434 10/24/21 0513 10/25/21 0416 10/26/21 0416 10/27/21 0532 10/28/21 0504 10/29/21 0444  NA 129* 132* 130* 134* 129* 132* 134*  K 4.3 4.3 4.1 4.1 4.0 3.9 3.9  CL 94* 95* 92* 97* 88* 96* 98  CO2 24 25 27 29 27 26 27   GLUCOSE 169* 150* 176* 165* 190* 205* 176*  BUN 59* 39* 29* 24* 38* 25* 22  CREATININE 5.18* 3.71* 3.09* 3.16* 4.08* 3.20* 2.53*  CALCIUM 8.4* 8.5* 8.2* 8.1* 8.3* 8.4* 8.3*  MG 2.4 1.9 1.8  --   --  1.8 1.6*    CBC: Recent Labs  Lab 10/23/21 0434 10/24/21 0513 10/25/21 0416 10/28/21 0504 10/29/21 0444  WBC 8.0 8.0 9.5 13.7* 10.3  HGB 8.3* 8.2* 8.2* 8.3* 7.3*  HCT 24.4* 24.3* 24.5* 25.0* 21.6*  MCV 83.8 85.9 83.9 85.0 87.4  PLT 218 220 224 225 195     Urinalysis: No results for input(s): "COLORURINE", "LABSPEC", "PHURINE", "GLUCOSEU", "HGBUR", "BILIRUBINUR", "KETONESUR", "PROTEINUR", "UROBILINOGEN", "NITRITE", "LEUKOCYTESUR" in the last 72  hours.  Invalid input(s): "APPERANCEUR"    Imaging: No results found.   Medications:     aspirin EC  81 mg Oral Daily   atorvastatin  40 mg Oral QHS   carvedilol  25 mg Oral BID WC   Chlorhexidine Gluconate Cloth  6 each Topical Q0600   cloNIDine  0.3 mg Oral TID   clopidogrel  75 mg Oral Daily   epoetin (EPOGEN/PROCRIT) injection  4,000 Units Intravenous Q T,Th,Sa-HD   famotidine  20 mg Oral QHS   glimepiride  2 mg Oral BID WC   heparin  5,000 Units Subcutaneous Q8H   hydrALAZINE  100 mg Oral TID   insulin aspart  0-15 Units Subcutaneous TID WC   insulin aspart  0-5 Units Subcutaneous QHS   magnesium oxide  400 mg Oral BID   NIFEdipine  60 mg Oral Daily   pioglitazone  7.5 mg Oral Daily    Assessment/ Plan:     Principal Problem:   Hypertensive urgency Active Problems:   Bilateral carotid artery stenosis   Iliac artery aneurysm (HCC)   Acute renal failure superimposed on stage 3a chronic kidney disease (HCC)   Coronary artery disease   Hyperlipemia   Type II diabetes mellitus with renal manifestations ([place])   Chest pain   Ectatic abdominal aorta (HCC)   Acute renal  failure [facility])  Ms. MYCHELLE KENDRA is a 76 y.o.  female  with past medical conditions including diabetes, CAD, hypertension, bilateral carotid artery stenosis, abdominal aorta and iliac artery aneurysm, hyperlipidemia, and chronic kidney disease stage IIIa, who was admitted to [facility] on 10/11/2021 for Hypertensive crisis [I16.9] Hypertensive urgency [I16.0] Hypertension, unspecified type [I10]   #1: Acute kidney injury on chronic kidney disease:.  She was initiated on dialysis presently tolerating well.  Had 2 L fluid removal yesterday.  Right chest wall permacath works well.   #2: Bilateral renal artery stenosis: Patient s/p left renal artery stent.  Possible intervention to the right renal artery also in the future.   #3: Hypertension: She is presently on nifedipine, hydralazine, clonidine and carvedilol.   Advised on importance of 2 g salt restricted diet.  Possible renal artery intervention in the next few weeks.   #4: Anemia: We will continue to monitor closely.  Continue to appointment.  #5: Diabetes: Continue present care.   We will continue to monitor closely.   LOS: [place] 10/22/202311:40 AM

## 2021-10-29 NOTE — Plan of Care (Addendum)
  Problem: Education: Goal: Ability to describe self-care measures that may prevent or decrease complications (Diabetes Survival Skills Education) will improve Outcome: Progressing Goal: Individualized Educational Video(s) Outcome: Progressing   Problem: Coping: Goal: Ability to adjust to condition or change in health will improve Outcome: Progressing   Problem: Fluid Volume: Goal: Ability to maintain a balanced intake and output will improve Outcome: Not Progressing   Problem: Health Behavior/Discharge Planning: Goal: Ability to identify and utilize available resources and services will improve Outcome: Progressing Goal: Ability to manage health-related needs will improve Outcome: Progressing   Problem: Metabolic: Goal: Ability to maintain appropriate glucose levels will improve Outcome: Not Progressing Note: R/T steroids increase in glucose   Problem: Nutritional: Goal: Maintenance of adequate nutrition will improve Outcome: Progressing Goal: Progress toward achieving an optimal weight will improve Outcome: Progressing   Problem: Skin Integrity: Goal: Risk for impaired skin integrity will decrease Outcome: Progressing   Problem: Education: Goal: Knowledge of General Education information will improve Description: Including pain rating scale, medication(s)/side effects and non-pharmacologic comfort measures Outcome: Progressing   Problem: Health Behavior/Discharge Planning: Goal: Ability to manage health-related needs will improve Outcome: Progressing   Problem: Clinical Measurements: Goal: Ability to maintain clinical measurements within normal limits will improve Outcome: Progressing Goal: Will remain free from infection Outcome: Not Progressing Note: R/T invasive lines (HD cath)  Goal: Diagnostic test results will improve Outcome: Progressing Goal: Respiratory complications will improve Outcome: Progressing Goal: Cardiovascular complication will be  avoided Outcome: Progressing   Problem: Activity: Goal: Risk for activity intolerance will decrease Outcome: Progressing   Problem: Nutrition: Goal: Adequate nutrition will be maintained Outcome: Progressing   Problem: Coping: Goal: Level of anxiety will decrease Outcome: Progressing   Problem: Elimination: Goal: Will not experience complications related to bowel motility Outcome: Progressing Goal: Will not experience complications related to urinary retention Outcome: Progressing   Problem: Pain Managment: Goal: General experience of comfort will improve Outcome: Progressing   Problem: Safety: Goal: Ability to remain free from injury will improve Outcome: Not Progressing Note: R/T patient needing assistance with ADL and to and from bathroom [facility])   Problem: Skin Integrity: Goal: Risk for impaired skin integrity will decrease Outcome: Progressing

## 2021-10-29 NOTE — Progress Notes (Addendum)
1910 patient alert on BSC ambulating in room with walker no assistance gate steady patient states that she has not had a BM in a few days but doesn't want fleets or suppository PRN MIRALAX and colace given to patient.

## 2021-10-29 NOTE — Progress Notes (Signed)
[place] [facility] [place] NAME: Dawn Ford    MR#:  [id number]  DATE OF BIRTH:  10-02-1945  SUBJECTIVE:  patient feeling a lot better.  Patient tolerating dialysis well. Sitting at the edge of the bed and eating. Worked with physical therapy home PT recommended.  VITALS:  Blood pressure (!) 157/53, pulse 83, temperature 98.8 F (37.1 C), temperature source Oral, resp. rate 17, height 5\' 6"  (1.676 m), weight 81.4 kg, SpO2 93 %.  PHYSICAL EXAMINATION:   GENERAL:  76 y.o.-year-old patient lying in the bed with acute distress.  LUNGS:decreased breath sounds bilaterally, no rales +no wheezing CARDIOVASCULAR: S1, S2 normal. No murmurs,  tachycardia+ ABDOMEN: Soft, nontender, nondistended. Bowel sounds present.  EXTREMITIES: No  edema b/l.    NEUROLOGIC: nonfocal  patient is alert and awake SKIN: No obvious rash, lesion, or ulcer.   LABORATORY PANEL:  CBC Recent Labs  Lab 10/29/21 0444  WBC 10.3  HGB 7.3*  HCT 21.6*  PLT 195     Chemistries  Recent Labs  Lab 10/29/21 0444  NA 134*  K 3.9  CL 98  CO2 27  GLUCOSE 176*  BUN 22  CREATININE 2.53*  CALCIUM 8.3*  MG 1.6*    Cardiac Enzymes No results for input(s): "TROPONINI" in the last 168 hours. RADIOLOGY:  No results found.  Assessment and Plan Dawn Ford is a 76 y.o. 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    Acute Respiratory distress 2/2 pulm edema from fluid overload --CXR 10/18 showed pulm edema, 2/2 fluid overload from MIVF given for AKI --IV lasix 40 mg x1 on 10/18 --placed on NRB --pt to get urgent HD --she had previous episode on 10/12 -- underwent ultrafiltration of 2 L yesterday with dialysis. Now on 2 L nasal cannula oxygen. Breathing much comfortably. --s/p  permanent dialysis catheter on  10/19  Hypertensive urgency -- Likely due to renal artery stenosis. --switch amlodipine to Nifedipine 60 mg daily, per nephro rec. --cont Lopressor at increased 100 mg BID --cont oral hydralazine 100 mg 3 times daily --cont clonidine 0.2 mg at increased TID --bp now better   Bilateral Renal artery stenosis  --10/9--s/p left renal artery stent placement --10/10-- CT abdomen pelvis done without contrast shows perinephric stranding likley due to  mild infarction post procedure causing abdominal pain. -- Discussed with Dr. Lucky Cowboy, recommends continue PRN pain meds and supportive care   Acute renal failure superimposed on stage 3a chronic kidney disease ([place]) Anemia of chronic disease Possibly due to dehydration and continuation for ramipril and HCTZ.  And later due to renal infarct during stent placement. --nephro consulted, started on temp dialysis on 10/14 --Hold ramipril and HCTZ --pt s/p PermCath placement 10/19--out pt chair time awaited   Metabolic acidosis --due to kidney failure -- resolved  Coronary artery disease Chest pain and history of CAD: Her chest pain has resolved.  -- Troponin negative x2.  --Blood pressure control as above -As needed nitroglycerin  -Continue aspirin, Lipitor, Toprol   Hyperlipemia - Lipitor   Type II diabetes mellitus with renal manifestations (HCC) Recent A1c 6.0, well controlled.  Patient was on Victoza, Actos and Amaryl PTA. 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  Will discharge once outpatient dialysis chair time is obtained.  DVT prophylaxis: Heparin SQ Code Status: Full code  Family Communication: none today Level of care:  Med-Surgs Dispo:  home with [facility]   TOTAL TIME TAKING CARE OF THIS PATIENT:25 minutes.  >50% time spent on counselling and coordination of care  Note: This dictation was prepared with Dragon dictation along with smaller phrase technology. Any transcriptional errors that result from this process are unintentional.  Fritzi Mandes M.D    [facility]   CC: Primary care physician; Idelle Crouch, MD

## 2021-10-30 DIAGNOSIS — I160 Hypertensive urgency: Secondary | ICD-10-CM | POA: Diagnosis not present

## 2021-10-30 LAB — GLUCOSE, CAPILLARY
Glucose-Capillary: 179 mg/dL — ABNORMAL HIGH (ref 70–99)
Glucose-Capillary: 263 mg/dL — ABNORMAL HIGH (ref 70–99)
Glucose-Capillary: 203 mg/dL — ABNORMAL HIGH (ref 70–99)

## 2021-10-30 MED ORDER — FUROSEMIDE 40 MG PO TABS
40.0000 mg | ORAL_TABLET | Freq: Every day | ORAL | Status: DC
  Administered 2021-10-30: 40 mg via ORAL
  Filled 2021-10-30: qty 1

## 2021-10-30 MED ORDER — NIFEDIPINE ER 60 MG PO TB24: 60.0000 mg | ORAL_TABLET | Freq: Every day | ORAL | 2 refills | Status: AC

## 2021-10-30 MED ORDER — CARVEDILOL 25 MG PO TABS: 25.0000 mg | ORAL_TABLET | Freq: Two times a day (BID) | ORAL | 2 refills | Status: AC

## 2021-10-30 MED ORDER — FUROSEMIDE 40 MG PO TABS: 40.0000 mg | ORAL_TABLET | Freq: Every day | ORAL | 2 refills | Status: DC

## 2021-10-30 MED ORDER — HYDRALAZINE HCL 100 MG PO TABS: 100.0000 mg | ORAL_TABLET | Freq: Three times a day (TID) | ORAL | 2 refills | Status: DC

## 2021-10-30 MED ORDER — CLONIDINE HCL 0.1 MG PO TABS: 0.2000 mg | ORAL_TABLET | Freq: Two times a day (BID) | ORAL | Status: DC

## 2021-10-30 MED ORDER — CLOPIDOGREL BISULFATE 75 MG PO TABS: 75.0000 mg | ORAL_TABLET | Freq: Every day | ORAL | 2 refills | Status: AC

## 2021-10-30 MED ORDER — CLONIDINE HCL 0.2 MG PO TABS: 0.2000 mg | ORAL_TABLET | Freq: Two times a day (BID) | ORAL | 11 refills | Status: DC

## 2021-10-30 NOTE — Progress Notes (Addendum)
Nutrition Education Note  RD consulted for Renal Education.   Case discussed with RN; requesting education prior to discharge.  RD consulted for Renal Education. RD provided "Food Pyramid for Healthy Eating with Kidney Disease handout. Reviewed food groups and provided written recommended serving sizes specifically determined for patient's current nutritional status.   Explained why diet restrictions are needed and provided lists of foods to limit/avoid that are high potassium, sodium, and phosphorus. Provided specific recommendations on safer alternatives of these foods. Strongly encouraged compliance of this diet.   Discussed importance of protein intake at each meal and snack. Provided examples of how to maximize protein intake throughout the day. Discussed need for fluid restriction with dialysis, importance of minimizing weight gain between HD treatments, and renal-friendly beverage options.  Encouraged pt to discuss specific diet questions/concerns with RD at HD outpatient facility. Teach back method used.  Expect fair compliance.  Current diet order is renal with 1.2 L fluid restriction, patient is consuming approximately n/a% of meals at this time. Labs and medications reviewed. No further nutrition interventions warranted at this time. RD contact information provided. If additional nutrition issues arise, please re-consult RD.  Loistine Chance, RD, LDN, [place] Registered Dietitian II Certified Diabetes Care and Education Specialist Please refer to [facility] for RD and/or RD on-call/weekend/after hours pager

## 2021-10-30 NOTE — Discharge Summary (Signed)
Physician Discharge Summary   Patient: Dawn Ford MRN: [id number] DOB: Apr 26, 1945  Admit date:     10/11/2021  Discharge date: 10/30/21  Discharge Physician: Fritzi Mandes   PCP: Idelle Crouch, MD   Recommendations at discharge:    patient follows with Dr. Doy Hutching pain 1 to 2 weeks  follow-up with Dr. Lucky Cowboy in 2 to 3 weeks for renal artery stenosis follow-up with Dr. Candiss Norse nephrology follow your outpatient dialysis schedule Tuesday Thursday Saturday    Discharge Diagnoses: Principal Problem:   Hypertensive urgency Active Problems:   Coronary artery disease   Chest pain   Acute renal failure superimposed on stage 3a chronic kidney disease ([place])   Bilateral carotid artery stenosis   Hyperlipemia   Type II diabetes mellitus with renal manifestations ([place])   Ectatic abdominal aorta (HCC)   Iliac artery aneurysm (HCC)   Acute renal failure [facility])  Hospital Course:  Dawn Ford is a 76 y.o. 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    Acute Respiratory distress 2/2 pulm edema from fluid overload --CXR 10/18 showed pulm edema, 2/2 fluid overload  -- underwent ultrafiltration of 2 L yesterday with dialysis. Now on 2 L nasal cannula oxygen. Breathing much comfortably. --s/p  permanent dialysis catheter on 10/19 --pt on RA --per Dr Candiss Norse lasix 40 mg po daily   Hypertensive urgency -- Likely due to renal artery stenosis. --per Nephrology switch amlodipine to Nifedipine 60 mg daily,hydralazine 100 mg 3 times daily,clonidine 0.2 mg bid --bp now better   Bilateral Renal artery stenosis  --10/9--s/p left renal artery stent placement --10/10-- CT abdomen pelvis done without contrast shows perinephric stranding likley due to  mild infarction post procedure causing abdominal pain. -- Discussed with Dr. Lucky Cowboy, recommends continue PRN  pain meds and supportive care -- patient will follow-up with Dr. Lucky Cowboy as outpatient for stent placement on the right renal artery   Acute renal failure superimposed on stage 3a chronic kidney disease ([place]) Anemia of chronic disease Possibly due to dehydration and continuation for ramipril and HCTZ.  And later due to renal infarct during stent placement. --nephro consulted, started on temp dialysis on 10/14 --Hold ramipril and HCTZ --pt s/p PermCath placement 10/19--out pt chair time awaited --pt getting epogen at HD   Metabolic acidosis --due to kidney failure -- resolved   Coronary artery disease Chest pain and history of CAD: Her chest pain has resolved.  -- Troponin negative x2.  --Blood pressure control as above -As needed nitroglycerin  -Continue aspirin, Lipitor, Toprol   Hyperlipemia - Lipitor   Type II diabetes mellitus with renal manifestations (HCC) Recent A1c 6.0, well controlled.  Patient was on Victoza, Actos and Amaryl PTA. --SSI while inpatient    H/o Right common iliac artery aneurysm  with increased growth and risk for lethal rupture; atherosclerotic occlusive disease of the right iliac arteries with rest pain; small abdominal aortic aneurysm s/p coil embolization right iliiac artery and stent placement right common iliac artery-- feb 2023   PT recommends home health PT. Will discharge patient to home today. Okay from nephrology standpoint. She will resume dialysis Tuesday Thursday Saturday on the chair time that will be called to her by dialysis coordinator.    DVT prophylaxis: Heparin SQ Code Status: Full code  Family Communication: none today Level of care: Med-Surgs Dispo:  home with [facility]  Consultants: vascular surgery, nephrology Procedures performed: left renal artery stent placement, HD catheter placement Disposition: Home health Diet recommendation:  Discharge Diet Orders (From admission, onward)     Start     Ordered   10/30/21 0000  Diet  - low sodium heart healthy        10/30/21 1311           Renal diet DISCHARGE MEDICATION: Allergies as of 10/30/2021   No Known Allergies      Medication List     STOP taking these medications    amLODipine 5 MG tablet Commonly known as: NORVASC   hydrochlorothiazide 25 MG tablet Commonly known as: HYDRODIURIL   metoprolol tartrate 50 MG tablet Commonly known as: LOPRESSOR   ramipril 10 MG capsule Commonly known as: ALTACE       TAKE these medications    aspirin EC 81 MG tablet Take 81 mg by mouth daily.   atorvastatin 40 MG tablet Commonly known as: LIPITOR Take 40 mg by mouth every evening.   carvedilol 25 MG tablet Commonly known as: COREG Take 1 tablet (25 mg total) by mouth 2 (two) times daily with a meal.   cloNIDine 0.2 MG tablet Commonly known as: CATAPRES Take 1 tablet (0.2 mg total) by mouth 2 (two) times daily.   clopidogrel 75 MG tablet Commonly known as: PLAVIX Take 1 tablet (75 mg total) by mouth daily. Start taking on: October 31, 2021   famotidine 40 MG tablet Commonly known as: PEPCID Take 40 mg by mouth at bedtime.   fexofenadine 180 MG tablet Commonly known as: ALLEGRA Take 180 mg by mouth daily as needed for allergies.   furosemide 40 MG tablet Commonly known as: LASIX Take 1 tablet (40 mg total) by mouth daily. Start taking on: October 31, 2021   glimepiride 4 MG tablet Commonly known as: AMARYL Take 2 mg by mouth 2 (two) times daily.   hydrALAZINE 100 MG tablet Commonly known as: APRESOLINE Take 1 tablet (100 mg total) by mouth 3 (three) times daily.   liraglutide 18 MG/3ML Sopn Commonly known as: VICTOZA Inject 1.8 mg into the skin every evening.   NIFEdipine 60 MG 24 hr tablet Commonly known as: ADALAT CC Take 1 tablet (60 mg total) by mouth daily. Start taking on: October 31, 2021   pioglitazone 15 MG tablet Commonly known as: ACTOS Take 7.5 mg by mouth daily.        Follow-up Information      Idelle Crouch, MD. Go on 11/06/2021.   Specialty: Internal Medicine Why: @1 :30pm Contact information: [place] [address] [phone]         Algernon Huxley, MD. Go on 11/21/2021.   Specialties: Vascular Surgery, Radiology, Interventional Cardiology Why: @1 :00pm Contact information: [place] [address] [phone]         Murlean Iba, MD. Schedule an appointment as soon as possible for a visit in 2 week(s).   Specialty: Nephrology Why: CKD-V on HD Contact information: [place] [address] [phone]                Discharge Exam: Danley Danker Weights   10/27/21 2202 10/27/21 1142 10/28/21 1548  Weight: 86.9 kg 85 kg 81.4 kg     Condition at discharge: fair  The results of significant diagnostics from this hospitalization (including imaging, microbiology, ancillary and laboratory) are listed below for reference.   Imaging Studies: PERIPHERAL  VASCULAR CATHETERIZATION  Result Date: 10/26/2021 See surgical note for result.  DG Chest Port 1 View  Result Date: 10/25/2021 CLINICAL DATA:  Shortness of breath EXAM: PORTABLE CHEST 1 VIEW COMPARISON:  Chest x-ray dated October 14th 2023 FINDINGS: Visualized cardiac and mediastinal contours are within normal limits. Increased bilateral interstitial opacities. Small bilateral pleural effusions and bibasilar atelectasis, increased when compared with the prior exam. No evidence of pneumothorax. IMPRESSION: 1. Worsened pulmonary edema. 2. Increased size of small bilateral pleural effusions. Electronically Signed   By: Yetta Glassman M.D.   On: 10/25/2021 10:58   DG Chest 1 View  Result Date: 10/21/2021 CLINICAL DATA:  Shortness of breath EXAM: CHEST  1 VIEW COMPARISON:  10/19/2021 FINDINGS: Stable heart size. Aortic atherosclerosis. Mild pulmonary vascular congestion with improving interstitial edema. Trace bilateral  pleural effusions. No pneumothorax. IMPRESSION: Improving interstitial edema. Electronically Signed   By: Davina Poke D.O.   On: 10/21/2021 15:19   DG Chest Port 1 View  Result Date: 10/19/2021 CLINICAL DATA:  Shortness of breath EXAM: PORTABLE CHEST 1 VIEW COMPARISON:  Previous studies including the examination of 10/11/2021 FINDINGS: Transverse diameter of heart is slightly increased. Central pulmonary vessels are more prominent. Increased interstitial markings are seen in parahilar regions and lower lung fields. Small bilateral pleural effusions are seen. There is no pneumothorax. IMPRESSION: There is prominence of central pulmonary vessels along with interval increase in interstitial markings suggesting CHF with pulmonary edema. Possibility of underlying pneumonia is not excluded. Interval appearance of small bilateral pleural effusions. Electronically Signed   By: Elmer Picker M.D.   On: 10/19/2021 09:42   CT ABDOMEN PELVIS WO CONTRAST  Result Date: 10/17/2021 CLINICAL DATA:  Abdominal pain, acute, nonlocalized EXAM: CT ABDOMEN AND PELVIS WITHOUT CONTRAST TECHNIQUE: Multidetector CT imaging of the abdomen and pelvis was performed following the standard protocol without IV contrast. RADIATION DOSE REDUCTION: This exam was performed according to the departmental dose-optimization program which includes automated exposure control, adjustment of the mA and/or kV according to patient size and/or use of iterative reconstruction technique. COMPARISON:  CT 02/13/2021 FINDINGS: Lower chest: Small bilateral pleural effusions with adjacent atelectasis. Hepatobiliary: No focal liver abnormality is seen. Vicarious excretion of contrast in the gallbladder. No cholecystitis. Pancreas: Unremarkable. No pancreatic ductal dilatation or surrounding inflammatory changes. Spleen: Normal in size without focal abnormality. Adrenals/Urinary Tract: Adrenal glands are unremarkable. No hydronephrosis. There are  hyperdense renal medullary pyramids bilaterally. The left kidney appears hypoattenuating in comparison to the right kidney, and is with new perinephric stranding. The bladder is markedly distended. Stomach/Bowel: Tiny hiatal hernia. There is no evidence of bowel obstruction.Normal appendix. Scattered colonic diverticula. Vascular/Lymphatic: New left renal artery stent. Unchanged ectatic infrarenal abdominal aorta measuring up to 2.6 cm. Unchanged right common iliac artery aneurysm or penetrating ulcer measuring up to 2.9 cm, with new traversing right common iliac artery stent. Reproductive: Unremarkable. Other: Subcutaneous stranding in the lower abdomen anteriorly likely related to medication injection. Musculoskeletal: No acute osseous abnormality. No suspicious osseous lesion. Multilevel degenerative changes of the spine. IMPRESSION: Interval left renal artery stent placement, with new left renal parenchymal hypoattenuation and significant adjacent perinephric stranding. Findings are nonspecific but concerning for left renal infarction versus an infectious/inflammatory process. No hydronephrosis. Retained contrast material in the right kidney and in the medullary pyramids bilaterally, consistent with poor renal function. Unchanged ectatic infrarenal aorta measuring up to 2.6 cm. Unchanged right common iliac artery aneurysm or penetrating ulcer with new iliac artery stent in place. Small bilateral  pleural effusions with adjacent atelectasis. These results will be called to the ordering clinician or representative by the Radiologist Assistant, and communication documented in the PACS or [employer]. Electronically Signed   By: Maurine Simmering M.D.   On: 10/17/2021 10:20   PERIPHERAL VASCULAR CATHETERIZATION  Result Date: 10/16/2021 See surgical note for result.  US RENAL ARTERY DUPLEX COMPLETE  Result Date: 10/13/2021 CLINICAL DATA:  200546 HTN (hypertension), malignant [id number] EXAM: RENAL/URINARY TRACT  ULTRASOUND RENAL DUPLEX DOPPLER ULTRASOUND COMPARISON:  None Available. FINDINGS: Right Kidney: Length: 8.8. Increased cortical echogenicity. No mass or hydronephrosis. Left Kidney: Length: 9.8 cm. Increased cortical echogenicity. No mass or hydronephrosis. Bladder:  Within normal limits, partially decompressed. RENAL DUPLEX ULTRASOUND Right Renal Artery Velocities: Origin:  172 cm/sec Mid:  150 cm/sec Hilum:  41 cm/sec Interlobar:  54 cm/sec Arcuate:  55 cm/sec; normal resistive indices 0.5 Left Renal Artery Velocities: Origin:  173 cm/sec Mid:  156 cm/sec Hilum:  72 cm/sec Interlobar:  85 cm/sec Arcuate:  58 cm/sec; normal resistive indices 0.7 Aortic Velocity:  97 cm/sec Right Renal-Aortic Ratios: Origin: 1.8 Mid:  1.5 Hilum: 0.4 Interlobar: 0.6 Arcuate: 0.6 Left Renal-Aortic Ratios: Origin: 1.8 Mid: 1.6 Hilum: 0.7 Interlobar: 0.9 Arcuate: 0.6 IMPRESSION: 1. No findings of renal artery stenosis by Doppler criteria. 2. Increased cortical echogenicity of the kidneys, suggestive of underlying medical renal disease. Electronically Signed   By: Albin Felling M.D.   On: 10/13/2021 14:08   DG Chest 2 View  Result Date: 10/11/2021 CLINICAL DATA:  Chest pain. EXAM: CHEST - 2 VIEW COMPARISON:  December 13, 2016. FINDINGS: The heart size and mediastinal contours are within normal limits. Both lungs are clear. The visualized skeletal structures are unremarkable. IMPRESSION: No active cardiopulmonary disease. Aortic Atherosclerosis (ICD10-I70.0). Electronically Signed   By: Marijo Conception M.D.   On: 10/11/2021 12:55    Microbiology: Results for orders placed or performed during the hospital encounter of 10/11/21  MRSA Next Gen by PCR, Nasal     Status: None   Collection Time: 10/25/21  5:52 PM   Specimen: Nasal Mucosa; Nasal Swab  Result Value Ref Range Status   MRSA by PCR Next Gen NOT DETECTED NOT DETECTED Final    Comment: (NOTE) The GeneXpert MRSA Assay (FDA approved for NASAL specimens only), is one  component of a comprehensive MRSA colonization surveillance program. It is not intended to diagnose MRSA infection nor to guide or monitor treatment for MRSA infections. Test performance is not FDA approved in patients less than 64 years old. Performed at [facility], [address] [place], [place] [address]     Labs: CBC: Recent Labs  Lab 10/24/21 [id number] 10/25/21 0416 10/28/21 0504 10/29/21 0444  WBC 8.0 9.5 13.7* 10.3  HGB 8.2* 8.2* 8.3* 7.3*  HCT 24.3* 24.5* 25.0* 21.6*  MCV 85.9 83.9 85.0 87.4  PLT 220 224 225 867   Basic Metabolic Panel: Recent Labs  Lab 10/24/21 0513 10/25/21 0416 10/26/21 0416 10/27/21 0532 10/28/21 0504 10/29/21 0444  NA 132* 130* 134* 129* 132* 134*  K 4.3 4.1 4.1 4.0 3.9 3.9  CL 95* 92* 97* 88* 96* 98  CO2 25 27 29 27 26 27   GLUCOSE 150* 176* 165* 190* 205* 176*  BUN 39* 29* 24* 38* 25* 22  CREATININE 3.71* 3.09* 3.16* 4.08* 3.20* 2.53*  CALCIUM 8.5* 8.2* 8.1* 8.3* 8.4* 8.3*  MG 1.9 1.8  --   --  1.8 1.6*   Liver Function Tests: No results for  input(s): "AST", "ALT", "ALKPHOS", "BILITOT", "PROT", "ALBUMIN" in the last 168 hours. CBG: Recent Labs  Lab 10/29/21 1145 10/29/21 1606 10/29/21 2018 10/30/21 0742 10/30/21 1146  GLUCAP 292* 273* 296* 179* 263*    Discharge time spent: greater than 30 minutes.  Signed: Fritzi Mandes, MD [facility] 10/30/2021

## 2021-10-30 NOTE — Progress Notes (Signed)
[facility]  PROGRESS NOTE   Subjective:   Patient seen sitting up in chair, currently eating lunch Denies pain or discomfort States she feels better, appetite improving Mild lower extremity edema Room air  Objective:  Vital signs: Blood pressure (!) 145/54, pulse 69, temperature 98.8 F (37.1 C), resp. rate 16, height 5\' 6"  (1.676 m), weight 81.4 kg, SpO2 98 %. No intake or output data in the 24 hours ending 10/30/21 1421  Filed Weights   10/27/21 0812 10/27/21 1142 10/28/21 1548  Weight: 86.9 kg 85 kg 81.4 kg     Physical Exam: General:  No acute distress  Head:  Normocephalic, atraumatic. Moist oral mucosal membranes  Eyes:  Anicteric  Lungs:   Clear to auscultation, normal effort  Heart:  S1S2 no rubs  Abdomen:   Soft, nontender, bowel sounds present  Extremities: 1+ peripheral edema.  Neurologic:  Awake, alert, following commands  Skin:  No lesions  Access: Right chest PermCath    Basic Metabolic Panel: Recent Labs  Lab 10/24/21 0513 10/25/21 0416 10/26/21 0416 10/27/21 0532 10/28/21 0504 10/29/21 0444  NA 132* 130* 134* 129* 132* 134*  K 4.3 4.1 4.1 4.0 3.9 3.9  CL 95* 92* 97* 88* 96* 98  CO2 25 27 29 27 26 27   GLUCOSE 150* 176* 165* 190* 205* 176*  BUN 39* 29* 24* 38* 25* 22  CREATININE 3.71* 3.09* 3.16* 4.08* 3.20* 2.53*  CALCIUM 8.5* 8.2* 8.1* 8.3* 8.4* 8.3*  MG 1.9 1.8  --   --  1.8 1.6*     CBC: Recent Labs  Lab 10/24/21 0513 10/25/21 0416 10/28/21 0504 10/29/21 0444  WBC 8.0 9.5 13.7* 10.3  HGB 8.2* 8.2* 8.3* 7.3*  HCT 24.3* 24.5* 25.0* 21.6*  MCV 85.9 83.9 85.0 87.4  PLT 220 224 225 195      Urinalysis: No results for input(s): "COLORURINE", "LABSPEC", "PHURINE", "GLUCOSEU", "HGBUR", "BILIRUBINUR", "KETONESUR", "PROTEINUR", "UROBILINOGEN", "NITRITE", "LEUKOCYTESUR" in the last 72 hours.  Invalid input(s): "APPERANCEUR"    Imaging: No results found.   Medications:     aspirin EC  81 mg Oral Daily    atorvastatin  40 mg Oral QHS   carvedilol  25 mg Oral BID WC   Chlorhexidine Gluconate Cloth  6 each Topical Q0600   cloNIDine  0.2 mg Oral BID   clopidogrel  75 mg Oral Daily   epoetin (EPOGEN/PROCRIT) injection  4,000 Units Intravenous Q T,Th,Sa-HD   famotidine  20 mg Oral QHS   furosemide  40 mg Oral Daily   glimepiride  2 mg Oral BID WC   heparin  5,000 Units Subcutaneous Q8H   hydrALAZINE  100 mg Oral TID   insulin aspart  0-15 Units Subcutaneous TID WC   insulin aspart  0-5 Units Subcutaneous QHS   NIFEdipine  60 mg Oral Daily   nystatin  5 mL Oral QID   pioglitazone  7.5 mg Oral Daily    Assessment/ Plan:     Principal Problem:   Hypertensive urgency Active Problems:   Bilateral carotid artery stenosis   Iliac artery aneurysm (HCC)   Acute renal failure superimposed on stage 3a chronic kidney disease (HCC)   Coronary artery disease   Hyperlipemia   Type II diabetes mellitus with renal manifestations ([place])   Chest pain   Ectatic abdominal aorta (HCC)   Acute renal failure ([place])  Dawn Ford is a 76 y.o.  female  with past medical conditions including diabetes, CAD, hypertension, bilateral carotid  artery stenosis, abdominal aorta and iliac artery aneurysm, hyperlipidemia, and chronic kidney disease stage IIIa, who was admitted to [facility] on 10[facility]11/2021 for Hypertensive crisis [I16.9] Hypertensive urgency [I16.0] Hypertension, unspecified type [I10]   #1: Acute kidney injury on chronic kidney disease stage IIIA:.  Baseline appears to be creatinine 1.1 with GFR 58 on 06/29/2021.  Due to progressive deteriorating renal function, patient initiated on dialysis.  Tolerating treatments well.  UF remains low due to adequate urine output.  Last treatment completed on Saturday.  Next treatment scheduled for Tuesday.  Renal navigator currently seeking outpatient clinic chair.   #2: Bilateral renal artery stenosis: Patient s/p left renal artery stent.  Possible intervention to the  right renal artery also in the future.   #3: Hypertension: She is presently on nifedipine, hydralazine, clonidine and carvedilol.  Clonidine 0.2 mg reduced to twice daily by primary team.  Salt tablets stopped.   #4: Anemia with chronic kidney disease: Hemoglobin below desired target.  Continue Epogen with dialysis treatments.  #5: Diabetes melitis type II with chronic kidney disease.  Hemoglobin A1c 5.7 on 10//20/23.  Glucose remains elevated at times.  Sliding scale insulin managed by primary team.      LOS: [facility] 10/23/20232:21 PM

## 2021-10-30 NOTE — Progress Notes (Signed)
Received MD order to discharge patient to home, reviewed discharge instructions, prescriptions, homes meds and follow up appointments with patient and patient verbalized understanding.

## 2021-10-30 NOTE — Progress Notes (Signed)
Mobility Specialist - Progress Note     10/30/21 1155  Mobility  Activity Ambulated with assistance in hallway  Level of Assistance Modified independent, requires aide device or extra time  Assistive Device Front wheel walker  Distance Ambulated (ft) 170 ft  Range of Motion/Exercises Left leg;Right leg (Marches in place)  Activity Response Tolerated well  $Mobility charge 1 Mobility   Pt received in recliner. Mod I for transfers and ambulation. Mild fatigue with activity, no other complaints. Left with chair alarm and needs at reach.   Loma Sender Mobility Specialist 10/30/21, 11:59 AM

## 2021-10-30 NOTE — TOC Progression Note (Addendum)
Transition of Care [facility]) - Progression Note    Patient Details  Name: Dawn Ford MRN: [id number] Date of Birth: March 05, 1945  Transition of [facility]) CM/SW [place], [place] Phone Number: 10/30/2021, 2:17 PM  Clinical Narrative:     SW met with the patient and she is interested in [facility] for home health. Estill Bamberg is assisting with the HD seat chair time.  The patient says she is interested in signing up for Medicaid so that she can sign up for [facility] services through Medicaid.  1613: Home Health cannot accept. SW faxing a referral to [place] outpatient PT fax number: [phone].  Expected Discharge Plan: [place]    Expected Discharge Plan and Services Expected Discharge Plan: [place] Choice: [place] arrangements for the past 2 months: Single Family Home Expected Discharge Date: 10/30/21                                     Social Determinants of Health (SDOH) Interventions Food Insecurity Interventions: Intervention Not Indicated Housing Interventions: Intervention Not Indicated Utilities Interventions: Intervention Not Indicated  Readmission Risk Interventions     No data to display

## 2021-10-30 NOTE — TOC Initial Note (Signed)
Transition of Care [facility]) - Initial/Assessment Note    Patient Details  Name: Dawn Ford MRN: [id number] Date of Birth: [birth year]  Transition of Care [facility]) CM/SW Contact:    Colen Darling, [place] Phone Number: 10/30/2021, 11:10 AM  Clinical Narrative:                  SW met with the patient at bedside. Family was present in the room. She is current with her PCP. She drives herself to appointments. Pharmacy is [facility] in [place], [place]. She picks up her medications in person. No prior home health or DME.  She is reviewing the [facility] list for her county. She would like to talk to the dialysis coordinator Elvera Bicker. Estill Bamberg is arranging an HD seat at [facility].  Expected Discharge Plan: [place]     Patient Goals and CMS Choice   CMS Medicare.gov Compare Post Acute Care list provided to:: Patient Choice offered to / list presented to : Patient  Expected Discharge Plan and Services Expected Discharge Plan: [place] Choice: Start arrangements for the past 2 months: Single Family Home                                      Prior Living Arrangements/Services Living arrangements for the past 2 months: Single Family Home   Patient language and need for interpreter reviewed:: No Do you feel safe going back to the place where you live?: Yes        Care giver support system in place?: Yes (comment)   Criminal Activity/Legal Involvement Pertinent to Current Situation/Hospitalization: No - Comment as needed  Activities of Daily Living Home Assistive Devices/Equipment: None ADL Screening (condition at time of admission) Patient's cognitive ability adequate to safely complete daily activities?: Yes Is the patient deaf or have difficulty hearing?: No Does the patient have difficulty seeing, even when wearing glasses/contacts?: No Does the patient have difficulty concentrating, remembering, or  making decisions?: No Patient able to express need for assistance with ADLs?: Yes Does the patient have difficulty dressing or bathing?: No Independently performs ADLs?: Yes (appropriate for developmental age) Does the patient have difficulty walking or climbing stairs?: No Weakness of Legs: None Weakness of Arms/Hands: None  Permission Sought/Granted                  Emotional Assessment Appearance:: Appears stated age Attitude/Demeanor/Rapport: Engaged   Orientation: : Oriented to Place, Oriented to  Time, Oriented to Situation, Oriented to Self      Admission diagnosis:  Hypertensive crisis [I16.9] Hypertensive urgency [I16.0] Hypertension, unspecified type [I10] Acute renal failure ([place]) [N17.9] Patient Active Problem List   Diagnosis Date Noted   Acute renal failure ([place]) 10/14/2021   Hypertensive urgency 10/11/2021   Acute renal failure superimposed on stage 3a chronic kidney disease ([place]) 10/11/2021   Coronary artery disease    Hyperlipemia    Type II diabetes mellitus with renal manifestations ([place])    Chest pain    Hypertensive crisis    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   Schatzki's ring    Gastroesophageal reflux disease    Esophageal dysphagia    Stomach irritation    Heartburn    Type 2 diabetes mellitus with microalbuminuria,  without long-term current use of insulin ([place]) 02/02/2015   Bilateral carotid artery stenosis 12/23/2014   Benign essential hypertension 12/16/2014   Family hx colonic polyps 09/23/2013   Baker's cyst 10/07/2010   Coronary artery disease involving native coronary artery of native heart 10/07/2010   Hyperlipidemia, mixed 10/07/2010   PCP:  Idelle Crouch, MD Pharmacy:   [facility] [address], [place] [address] Phone: [phone] Fax: [phone]     Social Determinants of  Health (SDOH) Interventions Food Insecurity Interventions: Intervention Not Indicated Housing Interventions: Intervention Not Indicated Utilities Interventions: Intervention Not Indicated  Readmission Risk Interventions     No data to display

## 2021-10-30 NOTE — Progress Notes (Signed)
PLACEMENT RESOLVED: [place] [facility] 12:55pm. Start date 10/24 at 12:30pm. Spoke with patient and sister via room phone to inform patient schedule.

## 2021-10-30 NOTE — Progress Notes (Signed)
New Dialysis Start   Patient identified as new dialysis start. Kidney Education packet assembled and given. Discussed the following items with patient:    Current medications and possible changes once started:  Discussed that patient's medications may change over time.  Ex; hypertension medications and diabetes medication.  Nephrologists will adjust as needed.  Fluid restrictions reviewed:  32 oz daily goal:  All liquids count; soups, ice, jello   Phosphorus and potassium: Handout given showing high potassium and phosphorus foods.  Alternative food and drink options given.  Family support:  no family present  Outpatient Clinic Resources:  Discussed roles of Outpatient clinic  staff and advised to make a list of needs, if any, to talk with outpatient staff if needed  Care plan schedule: Informed patient  of Care Plans in outpatient setting and to participate in the care plan.  An invitation would be given from outpatient clinic.   Dialysis Access Options:  Reviewed access options with patients. Discussed in detail about care at home with new AVG & AVF. Reviewed checking bruit and thrill. If dialysis catheter present, educated that patient could not take showers.  Catheter dressing changes were to be done by outpatient clinic staff only  Home therapy options:  Educated patient about home therapy options:  PD vs home hemo.     Patient verbalized understanding. Will continue to round on patient during admission.    Shayn Madole W Rosalina Dingwall, RN   

## 2021-10-30 NOTE — Care Management Important Message (Signed)
Important Message  Patient Details  Name: Dawn Ford MRN: [id number] Date of Birth: 10/24/1945   Medicare Important Message Given:  Yes     [name] 10/30/2021, 12:35 PM

## 2021-10-30 NOTE — Discharge Instructions (Addendum)
Go for your dialysis on your schedule Tuesday-Thursday-Saturday

## 2021-10-30 NOTE — Inpatient Diabetes Management (Signed)
Inpatient Diabetes Program Recommendations  AACE/ADA: New Consensus Statement on Inpatient Glycemic Control   Target Ranges:  Prepandial:   less than 140 mg/dL      Peak postprandial:   less than 180 mg/dL (1-2 hours)      Critically ill patients:  140 - 180 mg/dL    Latest Reference Range & Units 10/29/21 11:45 10/29/21 16:06 10/29/21 20:18 10/30/21 07:42  Glucose-Capillary 70 - 99 mg/dL 292 (H) 273 (H) 296 (H) 179 (H)    Latest Reference Range & Units 10/29/21 04:44  Glucose 70 - 99 mg/dL 176 (H)    Review of Glycemic Control  Diabetes history: DM2 Outpatient Diabetes medications: Amaryl 2 mg BID, Actos 7.5 mg daily, Victoza 1.8 mg daily Current orders for Inpatient glycemic control: Amaryl 2 mg BID, Actos 7.5 mg daily, Novolog 0-15 units TID with meals, Novolog 0-5 units QHS  Inpatient Diabetes Program Recommendations:    Insulin: Please consider ordering Novolog 3 units TID with meals for meal coverage if patient eats at least 50% of meals.  Thanks, Barnie Alderman, RN, MSN, [place] Diabetes Coordinator Inpatient Diabetes Program [phone] (Team Pager from 8am to [place])

## 2021-11-21 ENCOUNTER — Ambulatory Visit (INDEPENDENT_AMBULATORY_CARE_PROVIDER_SITE_OTHER): Payer: Medicare Other | Admitting: Vascular Surgery

## 2021-11-24 ENCOUNTER — Encounter (INDEPENDENT_AMBULATORY_CARE_PROVIDER_SITE_OTHER): Payer: Self-pay | Admitting: Vascular Surgery

## 2021-11-24 ENCOUNTER — Ambulatory Visit (INDEPENDENT_AMBULATORY_CARE_PROVIDER_SITE_OTHER): Payer: Medicare Other | Admitting: Vascular Surgery

## 2021-11-24 VITALS — BP 157/74 | HR 94 | Resp 19 | Ht 66.0 in | Wt 170.4 lb

## 2021-11-24 DIAGNOSIS — E1122 Type 2 diabetes mellitus with diabetic chronic kidney disease: Secondary | ICD-10-CM

## 2021-11-24 DIAGNOSIS — I723 Aneurysm of iliac artery: Secondary | ICD-10-CM

## 2021-11-24 DIAGNOSIS — N186 End stage renal disease: Secondary | ICD-10-CM

## 2021-11-24 DIAGNOSIS — N179 Acute kidney failure, unspecified: Secondary | ICD-10-CM

## 2021-11-24 DIAGNOSIS — Z992 Dependence on renal dialysis: Secondary | ICD-10-CM

## 2021-11-24 DIAGNOSIS — I169 Hypertensive crisis, unspecified: Secondary | ICD-10-CM

## 2021-11-24 DIAGNOSIS — N1831 Chronic kidney disease, stage 3a: Secondary | ICD-10-CM

## 2021-11-24 DIAGNOSIS — I701 Atherosclerosis of renal artery: Secondary | ICD-10-CM

## 2021-11-24 NOTE — Assessment & Plan Note (Signed)
Has undergone left renal artery stent placement but developed worsening renal dysfunction and is now on dialysis.  Has a known right renal artery stenosis.  We discussed that treating the right renal artery stenosis may improve her blood pressure and there is a chance it could get her off dialysis although that is difficult to discern.  I will discuss with her nephrologist if he thinks it is reasonable we can consider renal artery intervention in the next couple of weeks.  She would be very desirous of this if there is any chance of getting her off dialysis.

## 2021-11-24 NOTE — H&P (View-Only) (Signed)
MRN : [id number]  Dawn Ford is a 76 y.o. (1945-12-20) female who presents with chief complaint of No chief complaint on file. Marland Kitchen  History of Present Illness: Patient returns today in follow up of her renal stenosis.  She came in with acute kidney injury and underwent a left renal stent placement a few weeks ago.  She developed worsening renal dysfunction and progressed to dialysis and she is still on dialysis at this time.  She had a known right renal artery stenosis as well but we do not treat both sides concomitantly.  She had some perinephric hematoma with her renal stent placement previously on the left.  She is very desirous of getting off of dialysis if at all possible.  She has recently had a 24-hour creatinine clearance checked by nephrology and goes back to see them in about a week and a half to get the results. 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  This is to be checked again next month with duplex.  Current Outpatient Medications  Medication Sig Dispense Refill   aspirin EC 81 MG tablet Take 81 mg by mouth daily.     atorvastatin (LIPITOR) 40 MG tablet Take 40 mg by mouth every evening.     carvedilol (COREG) 25 MG tablet Take 1 tablet (25 mg total) by mouth 2 (two) times daily with a meal. 60 tablet 2   cloNIDine (CATAPRES) 0.2 MG tablet Take 1 tablet (0.2 mg total) by mouth 2 (two) times daily. 60 tablet 11   clopidogrel (PLAVIX) 75 MG tablet Take 1 tablet (75 mg total) by mouth daily. 30 tablet 2   famotidine (PEPCID) 40 MG tablet Take 40 mg by mouth at bedtime.     fexofenadine (ALLEGRA) 180 MG tablet Take 180 mg by mouth daily as needed for allergies.     furosemide (LASIX) 40 MG tablet Take 1 tablet (40 mg total) by mouth daily. 30 tablet 2   glimepiride (AMARYL) 4 MG tablet Take 2 mg by mouth 2 (two) times daily.     hydrALAZINE (APRESOLINE) 100 MG tablet Take 1 tablet (100 mg total) by mouth  3 (three) times daily. 90 tablet 2   liraglutide (VICTOZA) 18 MG/3ML SOPN Inject 1.8 mg into the skin every evening.     NIFEdipine (ADALAT CC) 60 MG 24 hr tablet Take 1 tablet (60 mg total) by mouth daily. 30 tablet 2   pioglitazone (ACTOS) 15 MG tablet Take 7.5 mg by mouth daily.     No current facility-administered medications for this visit.    Past Medical History:  Diagnosis Date   Aneurysm of right common iliac artery ([place]) 01/19/2021   a.) US aorta 01/19/2021: measure 2.8 cm. b.) CTA abdomen/pelvis 02/13/2021: measured 2.9 x 2.7 cm.   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   a.) CTA abdomen/pelvis: measured 2.6 cm   GERD (gastroesophageal reflux disease)    Hyperlipemia    Hypertension    NSTEMI (non-ST elevated myocardial infarction) ([place]) 02/17/1999   a.) LHC 02/17/1999 --> 75% LAD and subtotal RCA with thrombus --> PCI performed placing stent (unknown type) to RCA   T2DM (type 2 diabetes mellitus) ([place])     Past Surgical History:  Procedure Laterality Date   CATARACT EXTRACTION, BILATERAL     COLONOSCOPY WITH PROPOFOL N/A 09/01/2018   Procedure: COLONOSCOPY WITH PROPOFOL;  Surgeon: Toledo, Benay Pike, MD;  Location: [facility];  Service: Gastroenterology;  Laterality: N/A;   CORONARY ANGIOPLASTY WITH STENT PLACEMENT     DIALYSIS/PERMA CATHETER INSERTION N/A 10/26/2021   Procedure: DIALYSIS/PERMA CATHETER INSERTION;  Surgeon: Algernon Huxley, MD;  Location: [place] [facility];  Service: Cardiovascular;  Laterality: N/A;   ENDOVASCULAR REPAIR/STENT GRAFT N/A 03/01/2021   Procedure: ENDOVASCULAR REPAIR/STENT GRAFT;  Surgeon: Algernon Huxley, MD;  Location: [place] [facility];  Service: Cardiovascular;  Laterality: N/A;   ESOPHAGOGASTRODUODENOSCOPY (EGD) WITH PROPOFOL N/A 03/13/2017    Procedure: ESOPHAGOGASTRODUODENOSCOPY (EGD) WITH PROPOFOL;  Surgeon: Virgel Manifold, MD;  Location: [facility];  Service: Endoscopy;  Laterality: N/A;   KNEE ARTHROSCOPY     RENAL ANGIOGRAPHY Bilateral 10/16/2021   Procedure: RENAL ANGIOGRAPHY;  Surgeon: Algernon Huxley, MD;  Location: [place] [facility];  Service: Cardiovascular;  Laterality: Bilateral;     Social History   Tobacco Use   Smoking status: Former    Types: Cigarettes    Quit date: 2000    Years since quitting: 23.8   Smokeless tobacco: Never  Vaping Use   Vaping Use: Never used  Substance Use Topics   Alcohol use: Yes    Comment: rarely   Drug use: No       Family History  Problem Relation Age of Onset   Breast cancer Other   No bleeding disorders, clotting disorders, autoimmune diseases, or aneurysms  No Known Allergies   REVIEW OF SYSTEMS (Negative unless checked)  Constitutional: [] Weight loss  [] Fever  [] Chills Cardiac: [] Chest pain   [] Chest pressure   [] Palpitations   [] Shortness of breath when laying flat   [] Shortness of breath at rest   [] Shortness of breath with exertion. Vascular:  [] Pain in legs with walking   [] Pain in legs at rest   [] Pain in legs when laying flat   [] Claudication   [] Pain in feet when walking  [] Pain in feet at rest  [] Pain in feet when laying flat   [] History of DVT   [] Phlebitis   [x] Swelling in legs   [] Varicose veins   [] Non-healing ulcers Pulmonary:   [] Uses home oxygen   [] Productive cough   [] Hemoptysis   [] Wheeze  [] COPD   [] Asthma Neurologic:  [] Dizziness  [] Blackouts   [] Seizures   [] History of stroke   [] History of TIA  [] Aphasia   [] Temporary blindness   [] Dysphagia   [] Weakness or numbness in arms   [] Weakness or numbness in legs Musculoskeletal:  [x] Arthritis   [] Joint swelling   [] Joint pain   [] Low back pain Hematologic:  [] Easy bruising  [] Easy bleeding   [] Hypercoagulable state   [x] Anemic   Gastrointestinal:  [] Blood in stool   [] Vomiting blood   [] Gastroesophageal reflux/heartburn   [] Abdominal pain Genitourinary:  [x] Chronic kidney disease   [] Difficult urination  [] Frequent urination  [] Burning with urination   [] Hematuria Skin:  [] Rashes   [] Ulcers   [] Wounds Psychological:  [] History of anxiety   []  History of major depression.  Physical Examination  BP (!) 157/74 (BP Location: Left Arm)   Pulse 94   Resp 19   Ht 5\' 6"  (1.676 m)   Wt 170 lb 6.4 oz (77.3 kg)   BMI 27.50 kg/m  Gen:  WD/WN, NAD.  Appears far younger than stated age Head: [place]/AT, No temporalis wasting. Ear/Nose/Throat:  Hearing grossly intact, nares w/o erythema or drainage Eyes: Conjunctiva clear. Sclera non-icteric Neck: Supple.  Trachea midline Pulmonary:  Good air movement, no use of accessory muscles.  Cardiac: RRR, no JVD Vascular:  Vessel Right Left  Radial Palpable Palpable           Musculoskeletal: M/S 5/5 throughout.  No deformity or atrophy.  Mild lower extremity edema. Neurologic: Sensation grossly intact in extremities.  Symmetrical.  Speech is fluent.  Psychiatric: Judgment intact, Mood & affect appropriate for pt's clinical situation. Dermatologic: No rashes or ulcers noted.  No cellulitis or open wounds.      Labs Recent Results (from the past 2160 hour(s))  Basic metabolic panel     Status: Abnormal   Collection Time: 10/11/21 12:18 PM  Result Value Ref Range   Sodium 136 135 - 145 mmol/L   Potassium 4.8 3.5 - 5.1 mmol/L   Chloride 107 98 - 111 mmol/L   CO2 21 (L) 22 - 32 mmol/L   Glucose, Bld 218 (H) 70 - 99 mg/dL    Comment: Glucose reference range applies only to samples taken after fasting for at least 8 hours.   BUN 37 (H) 8 - 23 mg/dL   Creatinine, Ser 2.62 (H) 0.44 - 1.00 mg/dL   Calcium 9.0 8.9 - 10.3 mg/dL   GFR, Estimated 18 (L) >60 mL/min    Comment: (NOTE) Calculated using the CKD-EPI Creatinine Equation (2021)    Anion gap 8 5 - 15    Comment: Performed at [facility], [address]  [place], [place] [address]  CBC     Status: Abnormal   Collection Time: 10/11/21 12:18 PM  Result Value Ref Range   WBC 7.7 4.0 - 10.5 K/uL   RBC 4.17 3.87 - 5.11 MIL/uL   Hemoglobin 11.7 (L) 12.0 - 15.0 g/dL   HCT 36.6 36.0 - 46.0 %   MCV 87.8 80.0 - 100.0 fL   MCH 28.1 26.0 - 34.0 pg   MCHC 32.0 30.0 - 36.0 g/dL   RDW 13.7 11.5 - 15.5 %   Platelets 222 150 - 400 K/uL   nRBC 0.0 0.0 - 0.2 %    Comment: Performed at [facility], [place], [place] [address]  Troponin I (High Sensitivity)     Status: None   Collection Time: 10/11/21 12:18 PM  Result Value Ref Range   Troponin I (High Sensitivity) 8 <18 ng/L    Comment: (NOTE) Elevated high sensitivity troponin I (hsTnI) values and significant  changes across serial measurements may suggest ACS but many other  chronic and acute conditions are known to elevate hsTnI results.  Refer to the "Links" section for chest pain algorithms and additional  guidance. Performed at [facility], [address] [place],  [address]   Hemoglobin A1c     Status: Abnormal   Collection Time: 10/11/21 12:18 PM  Result Value Ref Range   Hgb A1c MFr Bld 5.7 (H) 4.8 - 5.6 %    Comment: (NOTE) Pre diabetes:          5.7%-6.4%  Diabetes:              >6.4%  Glycemic control for   <7.0% adults with diabetes    Mean Plasma Glucose 116.89 mg/dL    Comment: Performed at [facility] [address] [place], [place] [address]  Troponin I (High Sensitivity)     Status: None   Collection Time:  10/11/21  4:04 PM  Result Value Ref Range   Troponin I (High Sensitivity) 8 <18 ng/L    Comment: (NOTE) Elevated high sensitivity troponin I (hsTnI) values and significant  changes across serial measurements may suggest ACS but many other  chronic and acute conditions are known to elevate hsTnI results.  Refer to the "Links" section for chest pain algorithms and additional  guidance. Performed at [facility], [address] [place], [place] [address]   CBG monitoring, ED     Status: Abnormal   Collection Time: 10/11/21 10:11 PM  Result Value Ref Range   Glucose-Capillary 173 (H) 70 - 99 mg/dL    Comment: Glucose reference range applies only to samples taken after fasting for at least 8 hours.  Lipid panel     Status: Abnormal   Collection Time: 10/12/21  5:49 AM  Result Value Ref Range   Cholesterol 105 0 - 200 mg/dL   Triglycerides 57 <150 mg/dL   HDL 33 (L) >40 mg/dL   Total CHOL/HDL Ratio 3.2 RATIO   VLDL 11 0 - 40 mg/dL   LDL Cholesterol 61 0 - 99 mg/dL    Comment:        Total Cholesterol/HDL:CHD Risk Coronary Heart Disease Risk Table                     Men   Women  1/2 Average Risk   3.4   3.3  Average Risk       5.0   4.4  2 X Average Risk   9.6   7.1  3 X Average Risk  23.4   11.0        Use the calculated Patient Ratio above and the CHD Risk Table to determine the patient's CHD Risk.        ATP III CLASSIFICATION (LDL):  <100     mg/dL   Optimal  100-129  mg/dL   Near or Above                    Optimal  130-159  mg/dL   Borderline  160-189  mg/dL   High  >190     mg/dL   Very High Performed at [address] [place], [place] [address]   Basic metabolic panel     Status: Abnormal   Collection Time: 10/12/21  5:49 AM  Result Value Ref Range   Sodium 140 135 - 145 mmol/L   Potassium 4.2 3.5 - 5.1 mmol/L   Chloride 109 98 - 111 mmol/L   CO2 24 22 - 32 mmol/L   Glucose, Bld 85 70 - 99 mg/dL    Comment: Glucose reference range applies only to samples taken after fasting for at least 8 hours.   BUN 40 (H) 8 - 23 mg/dL   Creatinine, Ser 2.34 (H) 0.44 - 1.00 mg/dL   Calcium 8.5 (L) 8.9 - 10.3 mg/dL   GFR, Estimated 21 (L) >60 mL/min    Comment: (NOTE) Calculated using the CKD-EPI Creatinine Equation (2021)    Anion gap 7 5 - 15    Comment: Performed at [facility], [address] [place], [place] [address]  CBG  monitoring, ED     Status: None   Collection Time: 10/12/21  7:49 AM  Result Value Ref Range   Glucose-Capillary 90 70 - 99 mg/dL    Comment: Glucose reference range applies only to samples taken after fasting  for at least 8 hours.  CBG monitoring, ED     Status: Abnormal   Collection Time: 10/12/21 11:43 AM  Result Value Ref Range   Glucose-Capillary 220 (H) 70 - 99 mg/dL    Comment: Glucose reference range applies only to samples taken after fasting for at least 8 hours.  Protime-INR     Status: None   Collection Time: 10/12/21  2:08 PM  Result Value Ref Range   Prothrombin Time 13.9 11.4 - 15.2 seconds   INR 1.1 0.8 - 1.2    Comment: (NOTE) INR goal varies based on device and disease states. Performed at [facility], [address] [place], [place] [address]   Glucose, capillary     Status: None   Collection Time: 10/12/21  5:16 PM  Result Value Ref Range   Glucose-Capillary 86 70 - 99 mg/dL    Comment: Glucose reference range applies only to samples taken after fasting for at least 8 hours.  Glucose, capillary     Status: Abnormal   Collection Time: 10/12/21  8:40 PM  Result Value Ref Range   Glucose-Capillary 197 (H) 70 - 99 mg/dL    Comment: Glucose reference range applies only to samples taken after fasting for at least 8 hours.   Comment 1 Notify RN   Basic metabolic panel     Status: Abnormal   Collection Time: 10/13/21  5:01 AM  Result Value Ref Range   Sodium 138 135 - 145 mmol/L   Potassium 4.3 3.5 - 5.1 mmol/L   Chloride 109 98 - 111 mmol/L   CO2 22 22 - 32 mmol/L   Glucose, Bld 117 (H) 70 - 99 mg/dL    Comment: Glucose reference range applies only to samples taken after fasting for at least 8 hours.   BUN 40 (H) 8 - 23 mg/dL   Creatinine, Ser 2.47 (H) 0.44 - 1.00 mg/dL   Calcium 8.7 (L) 8.9 - 10.3 mg/dL   GFR, Estimated 19 (L) >60 mL/min    Comment: (NOTE) Calculated using the CKD-EPI Creatinine Equation (2021)    Anion gap 7 5 - 15     Comment: Performed at [facility], [address] [place], [place] [address]  Glucose, capillary     Status: Abnormal   Collection Time: 10/13/21  8:20 AM  Result Value Ref Range   Glucose-Capillary 157 (H) 70 - 99 mg/dL    Comment: Glucose reference range applies only to samples taken after fasting for at least 8 hours.  Glucose, capillary     Status: Abnormal   Collection Time: 10/13/21 11:41 AM  Result Value Ref Range   Glucose-Capillary 242 (H) 70 - 99 mg/dL    Comment: Glucose reference range applies only to samples taken after fasting for at least 8 hours.  Glucose, capillary     Status: Abnormal   Collection Time: 10/13/21  4:51 PM  Result Value Ref Range   Glucose-Capillary 219 (H) 70 - 99 mg/dL    Comment: Glucose reference range applies only to samples taken after fasting for at least 8 hours.  Glucose, capillary     Status: Abnormal   Collection Time: 10/13/21  8:09 PM  Result Value Ref Range   Glucose-Capillary 203 (H) 70 - 99 mg/dL    Comment: Glucose reference range applies only to samples taken after fasting for at least 8 hours.  Glucose, capillary     Status: None   Collection Time: 10/14/21 12:49 AM  Result  Value Ref Range   Glucose-Capillary 92 70 - 99 mg/dL    Comment: Glucose reference range applies only to samples taken after fasting for at least 8 hours.  Glucose, capillary     Status: Abnormal   Collection Time: 10/14/21  5:17 AM  Result Value Ref Range   Glucose-Capillary 104 (H) 70 - 99 mg/dL    Comment: Glucose reference range applies only to samples taken after fasting for at least 8 hours.   Comment 1 Notify RN   Glucose, capillary     Status: Abnormal   Collection Time: 10/14/21  7:45 AM  Result Value Ref Range   Glucose-Capillary 142 (H) 70 - 99 mg/dL    Comment: Glucose reference range applies only to samples taken after fasting for at least 8 hours.  Basic metabolic panel     Status: Abnormal   Collection Time: 10/14/21  9:00 AM   Result Value Ref Range   Sodium 137 135 - 145 mmol/L   Potassium 4.2 3.5 - 5.1 mmol/L   Chloride 111 98 - 111 mmol/L   CO2 22 22 - 32 mmol/L   Glucose, Bld 148 (H) 70 - 99 mg/dL    Comment: Glucose reference range applies only to samples taken after fasting for at least 8 hours.   BUN 37 (H) 8 - 23 mg/dL   Creatinine, Ser 2.39 (H) 0.44 - 1.00 mg/dL   Calcium 8.8 (L) 8.9 - 10.3 mg/dL   GFR, Estimated 20 (L) >60 mL/min    Comment: (NOTE) Calculated using the CKD-EPI Creatinine Equation (2021)    Anion gap 4 (L) 5 - 15    Comment: Performed at [facility], [address] [place], [place] [address]  Glucose, capillary     Status: Abnormal   Collection Time: 10/14/21 12:10 PM  Result Value Ref Range   Glucose-Capillary 278 (H) 70 - 99 mg/dL    Comment: Glucose reference range applies only to samples taken after fasting for at least 8 hours.  Glucose, capillary     Status: Abnormal   Collection Time: 10/14/21  4:17 PM  Result Value Ref Range   Glucose-Capillary 181 (H) 70 - 99 mg/dL    Comment: Glucose reference range applies only to samples taken after fasting for at least 8 hours.  Glucose, capillary     Status: Abnormal   Collection Time: 10/14/21  8:51 PM  Result Value Ref Range   Glucose-Capillary 190 (H) 70 - 99 mg/dL    Comment: Glucose reference range applies only to samples taken after fasting for at least 8 hours.   Comment 1 Notify RN   Glucose, capillary     Status: Abnormal   Collection Time: 10/15/21  8:27 AM  Result Value Ref Range   Glucose-Capillary 128 (H) 70 - 99 mg/dL    Comment: Glucose reference range applies only to samples taken after fasting for at least 8 hours.  Basic metabolic panel     Status: Abnormal   Collection Time: 10/15/21 10:14 AM  Result Value Ref Range   Sodium 137 135 - 145 mmol/L   Potassium 4.4 3.5 - 5.1 mmol/L   Chloride 109 98 - 111 mmol/L   CO2 20 (L) 22 - 32 mmol/L   Glucose, Bld 279 (H) 70 - 99 mg/dL    Comment:  Glucose reference range applies only to samples taken after fasting for at least 8 hours.   BUN 36 (H) 8 - 23 mg/dL   Creatinine, Ser  2.32 (H) 0.44 - 1.00 mg/dL   Calcium 9.0 8.9 - 10.3 mg/dL   GFR, Estimated 21 (L) >60 mL/min    Comment: (NOTE) Calculated using the CKD-EPI Creatinine Equation (2021)    Anion gap 8 5 - 15    Comment: Performed at [facility], [address] [place], [place] [address]  Glucose, capillary     Status: Abnormal   Collection Time: 10/15/21  1:21 PM  Result Value Ref Range   Glucose-Capillary 236 (H) 70 - 99 mg/dL    Comment: Glucose reference range applies only to samples taken after fasting for at least 8 hours.  Glucose, capillary     Status: Abnormal   Collection Time: 10/15/21  5:33 PM  Result Value Ref Range   Glucose-Capillary 160 (H) 70 - 99 mg/dL    Comment: Glucose reference range applies only to samples taken after fasting for at least 8 hours.  Glucose, capillary     Status: Abnormal   Collection Time: 10/15/21  8:28 PM  Result Value Ref Range   Glucose-Capillary 212 (H) 70 - 99 mg/dL    Comment: Glucose reference range applies only to samples taken after fasting for at least 8 hours.  Glucose, capillary     Status: Abnormal   Collection Time: 10/15/21  9:24 PM  Result Value Ref Range   Glucose-Capillary 210 (H) 70 - 99 mg/dL    Comment: Glucose reference range applies only to samples taken after fasting for at least 8 hours.  Basic metabolic panel     Status: Abnormal   Collection Time: 10/16/21  4:34 AM  Result Value Ref Range   Sodium 135 135 - 145 mmol/L   Potassium 4.6 3.5 - 5.1 mmol/L   Chloride 107 98 - 111 mmol/L   CO2 21 (L) 22 - 32 mmol/L   Glucose, Bld 145 (H) 70 - 99 mg/dL    Comment: Glucose reference range applies only to samples taken after fasting for at least 8 hours.   BUN 38 (H) 8 - 23 mg/dL   Creatinine, Ser 2.19 (H) 0.44 - 1.00 mg/dL   Calcium 8.9 8.9 - 10.3 mg/dL   GFR, Estimated 23 (L) >60 mL/min     Comment: (NOTE) Calculated using the CKD-EPI Creatinine Equation (2021)    Anion gap 7 5 - 15    Comment: Performed at [facility], [address] [place], [place] [address]  Glucose, capillary     Status: Abnormal   Collection Time: 10/16/21  7:54 AM  Result Value Ref Range   Glucose-Capillary 150 (H) 70 - 99 mg/dL    Comment: Glucose reference range applies only to samples taken after fasting for at least 8 hours.  Glucose, capillary     Status: Abnormal   Collection Time: 10/16/21 11:28 AM  Result Value Ref Range   Glucose-Capillary 138 (H) 70 - 99 mg/dL    Comment: Glucose reference range applies only to samples taken after fasting for at least 8 hours.  BUN     Status: Abnormal   Collection Time: 10/16/21  2:39 PM  Result Value Ref Range   BUN 36 (H) 8 - 23 mg/dL    Comment: Performed at [facility], [address] [place], [place] [address]  Creatinine, serum     Status: Abnormal   Collection Time: 10/16/21  2:39 PM  Result Value Ref Range   Creatinine, Ser 2.28 (H) 0.44 - 1.00 mg/dL   GFR, Estimated 21 (L) >  60 mL/min    Comment: (NOTE) Calculated using the CKD-EPI Creatinine Equation (2021) Performed at [facility], [address] [place], [place] [address]   Glucose, capillary     Status: Abnormal   Collection Time: 10/16/21  4:28 PM  Result Value Ref Range   Glucose-Capillary 295 (H) 70 - 99 mg/dL    Comment: Glucose reference range applies only to samples taken after fasting for at least 8 hours.  Glucose, capillary     Status: Abnormal   Collection Time: 10/16/21 10:06 PM  Result Value Ref Range   Glucose-Capillary 128 (H) 70 - 99 mg/dL    Comment: Glucose reference range applies only to samples taken after fasting for at least 8 hours.  Basic metabolic panel     Status: Abnormal   Collection Time: 10/17/21  3:29 AM  Result Value Ref Range   Sodium 134 (L) 135 - 145 mmol/L   Potassium 5.1 3.5 - 5.1 mmol/L   Chloride 104 98 -  111 mmol/L   CO2 18 (L) 22 - 32 mmol/L   Glucose, Bld 170 (H) 70 - 99 mg/dL    Comment: Glucose reference range applies only to samples taken after fasting for at least 8 hours.   BUN 40 (H) 8 - 23 mg/dL   Creatinine, Ser 2.98 (H) 0.44 - 1.00 mg/dL   Calcium 8.8 (L) 8.9 - 10.3 mg/dL   GFR, Estimated 16 (L) >60 mL/min    Comment: (NOTE) Calculated using the CKD-EPI Creatinine Equation (2021)    Anion gap 12 5 - 15    Comment: Performed at [facility], [address] [place], [place] [address]  CBC     Status: Abnormal   Collection Time: 10/17/21  3:29 AM  Result Value Ref Range   WBC 13.0 (H) 4.0 - 10.5 K/uL   RBC 3.50 (L) 3.87 - 5.11 MIL/uL   Hemoglobin 9.8 (L) 12.0 - 15.0 g/dL   HCT 29.7 (L) 36.0 - 46.0 %   MCV 84.9 80.0 - 100.0 fL   MCH 28.0 26.0 - 34.0 pg   MCHC 33.0 30.0 - 36.0 g/dL   RDW 13.7 11.5 - 15.5 %   Platelets 175 150 - 400 K/uL   nRBC 0.0 0.0 - 0.2 %    Comment: Performed at [facility], [address] [place], [place] [address]  Glucose, capillary     Status: Abnormal   Collection Time: 10/17/21  7:33 AM  Result Value Ref Range   Glucose-Capillary 158 (H) 70 - 99 mg/dL    Comment: Glucose reference range applies only to samples taken after fasting for at least 8 hours.  Glucose, capillary     Status: Abnormal   Collection Time: 10/17/21 12:11 PM  Result Value Ref Range   Glucose-Capillary 152 (H) 70 - 99 mg/dL    Comment: Glucose reference range applies only to samples taken after fasting for at least 8 hours.  Glucose, capillary     Status: Abnormal   Collection Time: 10/17/21  3:30 PM  Result Value Ref Range   Glucose-Capillary 260 (H) 70 - 99 mg/dL    Comment: Glucose reference range applies only to samples taken after fasting for at least 8 hours.  Glucose, capillary     Status: Abnormal   Collection Time: 10/18/21 12:27 AM  Result Value Ref Range   Glucose-Capillary 220 (H) 70 - 99 mg/dL    Comment: Glucose reference range  applies only to samples taken after  fasting for at least 8 hours.  Glucose, capillary     Status: Abnormal   Collection Time: 10/18/21  8:22 AM  Result Value Ref Range   Glucose-Capillary 145 (H) 70 - 99 mg/dL    Comment: Glucose reference range applies only to samples taken after fasting for at least 8 hours.  Basic metabolic panel     Status: Abnormal   Collection Time: 10/18/21  9:00 AM  Result Value Ref Range   Sodium 131 (L) 135 - 145 mmol/L   Potassium 4.4 3.5 - 5.1 mmol/L   Chloride 105 98 - 111 mmol/L   CO2 18 (L) 22 - 32 mmol/L   Glucose, Bld 144 (H) 70 - 99 mg/dL    Comment: Glucose reference range applies only to samples taken after fasting for at least 8 hours.   BUN 54 (H) 8 - 23 mg/dL   Creatinine, Ser 4.28 (H) 0.44 - 1.00 mg/dL   Calcium 8.6 (L) 8.9 - 10.3 mg/dL   GFR, Estimated 10 (L) >60 mL/min    Comment: (NOTE) Calculated using the CKD-EPI Creatinine Equation (2021)    Anion gap 8 5 - 15    Comment: Performed at [facility], [address] [place], [place] [id number]  CBC     Status: Abnormal   Collection Time: 10/18/21  9:00 AM  Result Value Ref Range   WBC 9.3 4.0 - 10.5 K/uL   RBC 3.04 (L) 3.87 - 5.11 MIL/uL   Hemoglobin 8.8 (L) 12.0 - 15.0 g/dL   HCT 25.8 (L) 36.0 - 46.0 %   MCV 84.9 80.0 - 100.0 fL   MCH 28.9 26.0 - 34.0 pg   MCHC 34.1 30.0 - 36.0 g/dL   RDW 13.3 11.5 - 15.5 %   Platelets 155 150 - 400 K/uL   nRBC 0.0 0.0 - 0.2 %    Comment: Performed at [facility], [address], [place], [place] [address]  Magnesium     Status: None   Collection Time: 10/18/21  9:00 AM  Result Value Ref Range   Magnesium 2.0 1.7 - 2.4 mg/dL    Comment: Performed at [facility], [address] [place], [place] [address]  Glucose, capillary     Status: Abnormal   Collection Time: 10/18/21 11:41 AM  Result Value Ref Range   Glucose-Capillary 238 (H) 70 - 99 mg/dL    Comment: Glucose reference range applies only to samples taken  after fasting for at least 8 hours.  Glucose, capillary     Status: Abnormal   Collection Time: 10/18/21  4:36 PM  Result Value Ref Range   Glucose-Capillary 204 (H) 70 - 99 mg/dL    Comment: Glucose reference range applies only to samples taken after fasting for at least 8 hours.  Glucose, capillary     Status: Abnormal   Collection Time: 10/18/21  9:14 PM  Result Value Ref Range   Glucose-Capillary 174 (H) 70 - 99 mg/dL    Comment: Glucose reference range applies only to samples taken after fasting for at least 8 hours.  Basic metabolic panel     Status: Abnormal   Collection Time: 10/19/21  4:52 AM  Result Value Ref Range   Sodium 132 (L) 135 - 145 mmol/L   Potassium 4.8 3.5 - 5.1 mmol/L   Chloride 106 98 - 111 mmol/L   CO2 16 (L) 22 - 32 mmol/L   Glucose, Bld 166 (H) 70 - 99 mg/dL  Comment: Glucose reference range applies only to samples taken after fasting for at least 8 hours.   BUN 52 (H) 8 - 23 mg/dL   Creatinine, Ser 4.21 (H) 0.44 - 1.00 mg/dL   Calcium 8.5 (L) 8.9 - 10.3 mg/dL   GFR, Estimated 10 (L) >60 mL/min    Comment: (NOTE) Calculated using the CKD-EPI Creatinine Equation (2021)    Anion gap 10 5 - 15    Comment: Performed at [facility], [address] [place], [place] [address]  CBC     Status: Abnormal   Collection Time: 10/19/21  4:52 AM  Result Value Ref Range   WBC 9.3 4.0 - 10.5 K/uL   RBC 3.04 (L) 3.87 - 5.11 MIL/uL   Hemoglobin 8.6 (L) 12.0 - 15.0 g/dL   HCT 26.0 (L) 36.0 - 46.0 %   MCV 85.5 80.0 - 100.0 fL   MCH 28.3 26.0 - 34.0 pg   MCHC 33.1 30.0 - 36.0 g/dL   RDW 13.2 11.5 - 15.5 %   Platelets 173 150 - 400 K/uL   nRBC 0.0 0.0 - 0.2 %    Comment: Performed at [facility], [address] [place], [place] [address]  Magnesium     Status: None   Collection Time: 10/19/21  4:52 AM  Result Value Ref Range   Magnesium 2.0 1.7 - 2.4 mg/dL    Comment: Performed at [facility], [address] [place], [place]  [address]  Glucose, capillary     Status: Abnormal   Collection Time: 10/19/21  8:03 AM  Result Value Ref Range   Glucose-Capillary 169 (H) 70 - 99 mg/dL    Comment: Glucose reference range applies only to samples taken after fasting for at least 8 hours.  Glucose, capillary     Status: Abnormal   Collection Time: 10/19/21 11:40 AM  Result Value Ref Range   Glucose-Capillary 199 (H) 70 - 99 mg/dL    Comment: Glucose reference range applies only to samples taken after fasting for at least 8 hours.  Glucose, capillary     Status: Abnormal   Collection Time: 10/19/21  4:39 PM  Result Value Ref Range   Glucose-Capillary 174 (H) 70 - 99 mg/dL    Comment: Glucose reference range applies only to samples taken after fasting for at least 8 hours.  Glucose, capillary     Status: Abnormal   Collection Time: 10/19/21  9:33 PM  Result Value Ref Range   Glucose-Capillary 202 (H) 70 - 99 mg/dL    Comment: Glucose reference range applies only to samples taken after fasting for at least 8 hours.  Basic metabolic panel     Status: Abnormal   Collection Time: 10/20/21  5:40 AM  Result Value Ref Range   Sodium 133 (L) 135 - 145 mmol/L   Potassium 4.6 3.5 - 5.1 mmol/L   Chloride 104 98 - 111 mmol/L   CO2 18 (L) 22 - 32 mmol/L   Glucose, Bld 156 (H) 70 - 99 mg/dL    Comment: Glucose reference range applies only to samples taken after fasting for at least 8 hours.   BUN 61 (H) 8 - 23 mg/dL   Creatinine, Ser 4.93 (H) 0.44 - 1.00 mg/dL   Calcium 8.8 (L) 8.9 - 10.3 mg/dL   GFR, Estimated 9 (L) >60 mL/min    Comment: (NOTE) Calculated using the CKD-EPI Creatinine Equation (2021)    Anion gap 11 5 - 15  Comment: Performed at [facility], [address] [place], [place] [address]  CBC     Status: Abnormal   Collection Time: 10/20/21  5:40 AM  Result Value Ref Range   WBC 8.6 4.0 - 10.5 K/uL   RBC 3.15 (L) 3.87 - 5.11 MIL/uL   Hemoglobin 9.0 (L) 12.0 - 15.0 g/dL   HCT 26.7 (L) 36.0 - 46.0 %    MCV 84.8 80.0 - 100.0 fL   MCH 28.6 26.0 - 34.0 pg   MCHC 33.7 30.0 - 36.0 g/dL   RDW 13.2 11.5 - 15.5 %   Platelets 190 150 - 400 K/uL   nRBC 0.0 0.0 - 0.2 %    Comment: Performed at [facility], [address], [place], [place] [address]  Magnesium     Status: None   Collection Time: 10/20/21  5:40 AM  Result Value Ref Range   Magnesium 2.2 1.7 - 2.4 mg/dL    Comment: Performed at [facility], [address] [place], [place] [address]  Glucose, capillary     Status: Abnormal   Collection Time: 10/20/21  7:46 AM  Result Value Ref Range   Glucose-Capillary 160 (H) 70 - 99 mg/dL    Comment: Glucose reference range applies only to samples taken after fasting for at least 8 hours.  Glucose, capillary     Status: Abnormal   Collection Time: 10/20/21 12:20 PM  Result Value Ref Range   Glucose-Capillary 217 (H) 70 - 99 mg/dL    Comment: Glucose reference range applies only to samples taken after fasting for at least 8 hours.  Glucose, capillary     Status: Abnormal   Collection Time: 10/20/21  5:12 PM  Result Value Ref Range   Glucose-Capillary 241 (H) 70 - 99 mg/dL    Comment: Glucose reference range applies only to samples taken after fasting for at least 8 hours.  Glucose, capillary     Status: Abnormal   Collection Time: 10/20/21  9:07 PM  Result Value Ref Range   Glucose-Capillary 238 (H) 70 - 99 mg/dL    Comment: Glucose reference range applies only to samples taken after fasting for at least 8 hours.  Glucose, capillary     Status: Abnormal   Collection Time: 10/20/21 11:41 PM  Result Value Ref Range   Glucose-Capillary 198 (H) 70 - 99 mg/dL    Comment: Glucose reference range applies only to samples taken after fasting for at least 8 hours.  Basic metabolic panel     Status: Abnormal   Collection Time: 10/21/21  5:26 AM  Result Value Ref Range   Sodium 128 (L) 135 - 145 mmol/L   Potassium 4.6 3.5 - 5.1 mmol/L   Chloride 100 98 - 111 mmol/L   CO2  17 (L) 22 - 32 mmol/L   Glucose, Bld 169 (H) 70 - 99 mg/dL    Comment: Glucose reference range applies only to samples taken after fasting for at least 8 hours.   BUN 70 (H) 8 - 23 mg/dL   Creatinine, Ser 5.37 (H) 0.44 - 1.00 mg/dL   Calcium 8.4 (L) 8.9 - 10.3 mg/dL   GFR, Estimated 8 (L) >60 mL/min    Comment: (NOTE) Calculated using the CKD-EPI Creatinine Equation (2021)    Anion gap 11 5 - 15    Comment: Performed at [facility], [address] [place], [place] [address]  CBC     Status: Abnormal   Collection Time: 10/21/21  5:26 AM  Result Value Ref Range   WBC 8.2 4.0 - 10.5 K/uL   RBC 2.97 (L) 3.87 - 5.11 MIL/uL   Hemoglobin 8.4 (L) 12.0 - 15.0 g/dL   HCT 24.5 (L) 36.0 - 46.0 %   MCV 82.5 80.0 - 100.0 fL   MCH 28.3 26.0 - 34.0 pg   MCHC 34.3 30.0 - 36.0 g/dL   RDW 12.6 11.5 - 15.5 %   Platelets 198 150 - 400 K/uL   nRBC 0.0 0.0 - 0.2 %    Comment: Performed at [facility], [address] [place], [place] [address]  Magnesium     Status: None   Collection Time: 10/21/21  5:26 AM  Result Value Ref Range   Magnesium 2.2 1.7 - 2.4 mg/dL    Comment: Performed at [facility], [address] [place], [place] [address]  Glucose, capillary     Status: Abnormal   Collection Time: 10/21/21  8:13 AM  Result Value Ref Range   Glucose-Capillary 169 (H) 70 - 99 mg/dL    Comment: Glucose reference range applies only to samples taken after fasting for at least 8 hours.  Glucose, capillary     Status: Abnormal   Collection Time: 10/21/21 11:20 AM  Result Value Ref Range   Glucose-Capillary 261 (H) 70 - 99 mg/dL    Comment: Glucose reference range applies only to samples taken after fasting for at least 8 hours.  Hepatitis B surface antigen     Status: None   Collection Time: 10/21/21  2:54 PM  Result Value Ref Range   Hepatitis B Surface Ag NON REACTIVE NON REACTIVE    Comment: Performed at [facility], [address], [place], [place]  [address]  Hepatitis B surface antibody     Status: Abnormal   Collection Time: 10/21/21  2:54 PM  Result Value Ref Range   Hep B S Ab Reactive (A) NON REACTIVE    Comment: (NOTE) Consistent with immunity, greater than 9.9 mIU/mL.  Performed at [facility], [place] [address] [place], [place] [address]   Hepatitis B surface antibody,quantitative     Status: None   Collection Time: 10/21/21  2:54 PM  Result Value Ref Range   Hep B S AB Quant (Post) 143.7 Immunity>9.9 mIU/mL    Comment: (NOTE)  Status of Immunity                     Anti-HBs Level  ------------------                     -------------- Inconsistent with Immunity                   0.0 - 9.9 Consistent with Immunity                          >9.9 Performed At: [facility] [address] [place], [place] [id number] Rush Farmer MD [id number]   Hepatitis B core antibody, total     Status: Abnormal   Collection Time: 10/21/21  2:54 PM  Result Value Ref Range   Hep B Core Total Ab Reactive (A) NON REACTIVE    Comment: Performed at [facility], [place] [address], [place],  [address]  Hepatitis C antibody     Status: None   Collection Time: 10/21/21  2:54 PM  Result Value Ref Range   HCV  Ab NON REACTIVE NON REACTIVE    Comment: (NOTE) Nonreactive HCV antibody screen is consistent with no HCV infections,  unless recent infection is suspected or other evidence exists to indicate HCV infection.  Performed at [facility], [place] [address], [place], [place] [address]   Glucose, capillary     Status: Abnormal   Collection Time: 10/21/21  4:58 PM  Result Value Ref Range   Glucose-Capillary 229 (H) 70 - 99 mg/dL    Comment: Glucose reference range applies only to samples taken after fasting for at least 8 hours.  Renal function panel     Status: Abnormal   Collection Time: 10/21/21  7:00 PM  Result Value Ref Range   Sodium 131 (L) 135 - 145 mmol/L   Potassium 3.8 3.5 - 5.1 mmol/L    Chloride 96 (L) 98 - 111 mmol/L   CO2 24 22 - 32 mmol/L   Glucose, Bld 129 (H) 70 - 99 mg/dL    Comment: Glucose reference range applies only to samples taken after fasting for at least 8 hours.   BUN 46 (H) 8 - 23 mg/dL   Creatinine, Ser 3.38 (H) 0.44 - 1.00 mg/dL   Calcium 8.2 (L) 8.9 - 10.3 mg/dL   Phosphorus 2.7 2.5 - 4.6 mg/dL   Albumin 3.1 (L) 3.5 - 5.0 g/dL   GFR, Estimated 13 (L) >60 mL/min    Comment: (NOTE) Calculated using the CKD-EPI Creatinine Equation (2021)    Anion gap 11 5 - 15    Comment: Performed at [facility], [address] [place], [place] [address]  CBC     Status: Abnormal   Collection Time: 10/21/21  7:00 PM  Result Value Ref Range   WBC 12.0 (H) 4.0 - 10.5 K/uL   RBC 2.99 (L) 3.87 - 5.11 MIL/uL   Hemoglobin 8.4 (L) 12.0 - 15.0 g/dL   HCT 24.4 (L) 36.0 - 46.0 %   MCV 81.6 80.0 - 100.0 fL   MCH 28.1 26.0 - 34.0 pg   MCHC 34.4 30.0 - 36.0 g/dL   RDW 12.6 11.5 - 15.5 %   Platelets 181 150 - 400 K/uL   nRBC 0.0 0.0 - 0.2 %    Comment: Performed at [facility], [address] [place], [place] [address]  Glucose, capillary     Status: Abnormal   Collection Time: 10/21/21  9:54 PM  Result Value Ref Range   Glucose-Capillary 139 (H) 70 - 99 mg/dL    Comment: Glucose reference range applies only to samples taken after fasting for at least 8 hours.  Basic metabolic panel     Status: Abnormal   Collection Time: 10/22/21  6:42 AM  Result Value Ref Range   Sodium 128 (L) 135 - 145 mmol/L   Potassium 4.2 3.5 - 5.1 mmol/L   Chloride 96 (L) 98 - 111 mmol/L   CO2 22 22 - 32 mmol/L   Glucose, Bld 184 (H) 70 - 99 mg/dL    Comment: Glucose reference range applies only to samples taken after fasting for at least 8 hours.   BUN 51 (H) 8 - 23 mg/dL   Creatinine, Ser 4.71 (H) 0.44 - 1.00 mg/dL   Calcium 8.3 (L) 8.9 - 10.3 mg/dL   GFR, Estimated 9 (L) >60 mL/min    Comment: (NOTE) Calculated using the CKD-EPI Creatinine Equation (2021)     Anion gap 10 5 - 15    Comment: Performed at [facility],  [place], [place] [address]  CBC     Status: Abnormal   Collection Time: 10/22/21  6:42 AM  Result Value Ref Range   WBC 9.2 4.0 - 10.5 K/uL   RBC 3.13 (L) 3.87 - 5.11 MIL/uL   Hemoglobin 8.8 (L) 12.0 - 15.0 g/dL   HCT 25.7 (L) 36.0 - 46.0 %   MCV 82.1 80.0 - 100.0 fL   MCH 28.1 26.0 - 34.0 pg   MCHC 34.2 30.0 - 36.0 g/dL   RDW 12.7 11.5 - 15.5 %   Platelets 214 150 - 400 K/uL   nRBC 0.0 0.0 - 0.2 %    Comment: Performed at [facility], [address] [place], [place] [address]  Magnesium     Status: None   Collection Time: 10/22/21  6:42 AM  Result Value Ref Range   Magnesium 2.0 1.7 - 2.4 mg/dL    Comment: Performed at [facility], [address] [place], [place] [address]  Glucose, capillary     Status: Abnormal   Collection Time: 10/22/21  8:36 AM  Result Value Ref Range   Glucose-Capillary 185 (H) 70 - 99 mg/dL    Comment: Glucose reference range applies only to samples taken after fasting for at least 8 hours.  Vitamin B12     Status: None   Collection Time: 10/22/21 12:31 PM  Result Value Ref Range   Vitamin B-12 369 180 - 914 pg/mL    Comment: (NOTE) This assay is not validated for testing neonatal or myeloproliferative syndrome specimens for Vitamin B12 levels. Performed at [facility], [place] [address], [place], [place] [address]   Folate     Status: None   Collection Time: 10/22/21 12:31 PM  Result Value Ref Range   Folate 13.7 >5.9 ng/mL    Comment: Performed at [facility], [address] [place], [place] [address]  Iron and TIBC     Status: None   Collection Time: 10/22/21 12:31 PM  Result Value Ref Range   Iron 29 28 - 170 ug/dL   TIBC 252 250 - 450 ug/dL   Saturation Ratios 12 10.4 - 31.8 %   UIBC 223 ug/dL    Comment: Performed at [facility], [address] [place], [place] [address]  Ferritin     Status: None    Collection Time: 10/22/21 12:31 PM  Result Value Ref Range   Ferritin 200 11 - 307 ng/mL    Comment: Performed at [facility], [address] [place], [place] [address]  Glucose, capillary     Status: Abnormal   Collection Time: 10/22/21 12:56 PM  Result Value Ref Range   Glucose-Capillary 246 (H) 70 - 99 mg/dL    Comment: Glucose reference range applies only to samples taken after fasting for at least 8 hours.  Glucose, capillary     Status: Abnormal   Collection Time: 10/22/21  5:05 PM  Result Value Ref Range   Glucose-Capillary 250 (H) 70 - 99 mg/dL    Comment: Glucose reference range applies only to samples taken after fasting for at least 8 hours.  Glucose, capillary     Status: Abnormal   Collection Time: 10/22/21  8:49 PM  Result Value Ref Range   Glucose-Capillary 216 (H) 70 - 99 mg/dL    Comment: Glucose reference range applies only to samples taken after fasting for at least 8 hours.  Basic metabolic panel     Status:  Abnormal   Collection Time: 10/23/21  4:34 AM  Result Value Ref Range   Sodium 129 (L) 135 - 145 mmol/L   Potassium 4.3 3.5 - 5.1 mmol/L   Chloride 94 (L) 98 - 111 mmol/L   CO2 24 22 - 32 mmol/L   Glucose, Bld 169 (H) 70 - 99 mg/dL    Comment: Glucose reference range applies only to samples taken after fasting for at least 8 hours.   BUN 59 (H) 8 - 23 mg/dL   Creatinine, Ser 5.18 (H) 0.44 - 1.00 mg/dL   Calcium 8.4 (L) 8.9 - 10.3 mg/dL   GFR, Estimated 8 (L) >60 mL/min    Comment: (NOTE) Calculated using the CKD-EPI Creatinine Equation (2021)    Anion gap 11 5 - 15    Comment: Performed at [facility], [address] [place], [place] [address]  CBC     Status: Abnormal   Collection Time: 10/23/21  4:34 AM  Result Value Ref Range   WBC 8.0 4.0 - 10.5 K/uL   RBC 2.91 (L) 3.87 - 5.11 MIL/uL   Hemoglobin 8.3 (L) 12.0 - 15.0 g/dL   HCT 24.4 (L) 36.0 - 46.0 %   MCV 83.8 80.0 - 100.0 fL   MCH 28.5 26.0 - 34.0 pg   MCHC 34.0 30.0 -  36.0 g/dL   RDW 12.5 11.5 - 15.5 %   Platelets 218 150 - 400 K/uL   nRBC 0.0 0.0 - 0.2 %    Comment: Performed at [facility], [address], [place], [place] [address]  Magnesium     Status: None   Collection Time: 10/23/21  4:34 AM  Result Value Ref Range   Magnesium 2.4 1.7 - 2.4 mg/dL    Comment: Performed at [facility], [address] [place], [place] [address]  Glucose, capillary     Status: Abnormal   Collection Time: 10/23/21  8:01 AM  Result Value Ref Range   Glucose-Capillary 203 (H) 70 - 99 mg/dL    Comment: Glucose reference range applies only to samples taken after fasting for at least 8 hours.  Glucose, capillary     Status: Abnormal   Collection Time: 10/23/21 12:20 PM  Result Value Ref Range   Glucose-Capillary 109 (H) 70 - 99 mg/dL    Comment: Glucose reference range applies only to samples taken after fasting for at least 8 hours.  Glucose, capillary     Status: Abnormal   Collection Time: 10/23/21  4:33 PM  Result Value Ref Range   Glucose-Capillary 197 (H) 70 - 99 mg/dL    Comment: Glucose reference range applies only to samples taken after fasting for at least 8 hours.  Glucose, capillary     Status: Abnormal   Collection Time: 10/23/21  8:39 PM  Result Value Ref Range   Glucose-Capillary 173 (H) 70 - 99 mg/dL    Comment: Glucose reference range applies only to samples taken after fasting for at least 8 hours.  Basic metabolic panel     Status: Abnormal   Collection Time: 10/24/21  5:13 AM  Result Value Ref Range   Sodium 132 (L) 135 - 145 mmol/L   Potassium 4.3 3.5 - 5.1 mmol/L   Chloride 95 (L) 98 - 111 mmol/L   CO2 25 22 - 32 mmol/L   Glucose, Bld 150 (H) 70 - 99 mg/dL    Comment: Glucose reference range applies only to samples taken after fasting for at least 8  hours.   BUN 39 (H) 8 - 23 mg/dL   Creatinine, Ser 3.71 (H) 0.44 - 1.00 mg/dL   Calcium 8.5 (L) 8.9 - 10.3 mg/dL   GFR, Estimated 12 (L) >60 mL/min    Comment:  (NOTE) Calculated using the CKD-EPI Creatinine Equation (2021)    Anion gap 12 5 - 15    Comment: Performed at [facility], [address] [place], [place] [address]  CBC     Status: Abnormal   Collection Time: 10/24/21  5:13 AM  Result Value Ref Range   WBC 8.0 4.0 - 10.5 K/uL   RBC 2.83 (L) 3.87 - 5.11 MIL/uL   Hemoglobin 8.2 (L) 12.0 - 15.0 g/dL   HCT 24.3 (L) 36.0 - 46.0 %   MCV 85.9 80.0 - 100.0 fL   MCH 29.0 26.0 - 34.0 pg   MCHC 33.7 30.0 - 36.0 g/dL   RDW 12.9 11.5 - 15.5 %   Platelets 220 150 - 400 K/uL   nRBC 0.0 0.0 - 0.2 %    Comment: Performed at [facility], [address] [place], [place] [address]  Magnesium     Status: None   Collection Time: 10/24/21  5:13 AM  Result Value Ref Range   Magnesium 1.9 1.7 - 2.4 mg/dL    Comment: Performed at [facility], [address] [place], [place] [address]  Glucose, capillary     Status: Abnormal   Collection Time: 10/24/21  8:01 AM  Result Value Ref Range   Glucose-Capillary 160 (H) 70 - 99 mg/dL    Comment: Glucose reference range applies only to samples taken after fasting for at least 8 hours.  Glucose, capillary     Status: Abnormal   Collection Time: 10/24/21 12:26 PM  Result Value Ref Range   Glucose-Capillary 174 (H) 70 - 99 mg/dL    Comment: Glucose reference range applies only to samples taken after fasting for at least 8 hours.  Glucose, capillary     Status: Abnormal   Collection Time: 10/24/21  4:32 PM  Result Value Ref Range   Glucose-Capillary 205 (H) 70 - 99 mg/dL    Comment: Glucose reference range applies only to samples taken after fasting for at least 8 hours.  Glucose, capillary     Status: Abnormal   Collection Time: 10/24/21  8:52 PM  Result Value Ref Range   Glucose-Capillary 201 (H) 70 - 99 mg/dL    Comment: Glucose reference range applies only to samples taken after fasting for at least 8 hours.  Basic metabolic panel     Status: Abnormal   Collection Time:  10/25/21  4:16 AM  Result Value Ref Range   Sodium 130 (L) 135 - 145 mmol/L   Potassium 4.1 3.5 - 5.1 mmol/L   Chloride 92 (L) 98 - 111 mmol/L   CO2 27 22 - 32 mmol/L   Glucose, Bld 176 (H) 70 - 99 mg/dL    Comment: Glucose reference range applies only to samples taken after fasting for at least 8 hours.   BUN 29 (H) 8 - 23 mg/dL   Creatinine, Ser 3.09 (H) 0.44 - 1.00 mg/dL   Calcium 8.2 (L) 8.9 - 10.3 mg/dL   GFR, Estimated 15 (L) >60 mL/min    Comment: (NOTE) Calculated using the CKD-EPI Creatinine Equation (2021)    Anion gap 11 5 - 15    Comment: Performed at [facility], [address], [place], [place] [address]  CBC     Status: Abnormal   Collection Time: 10/25/21  4:16 AM  Result Value Ref Range   WBC 9.5 4.0 - 10.5 K/uL   RBC 2.92 (L) 3.87 - 5.11 MIL/uL   Hemoglobin 8.2 (L) 12.0 - 15.0 g/dL   HCT 24.5 (L) 36.0 - 46.0 %   MCV 83.9 80.0 - 100.0 fL   MCH 28.1 26.0 - 34.0 pg   MCHC 33.5 30.0 - 36.0 g/dL   RDW 12.6 11.5 - 15.5 %   Platelets 224 150 - 400 K/uL   nRBC 0.0 0.0 - 0.2 %    Comment: Performed at [facility], [address] [place], [place] [address]  Magnesium     Status: None   Collection Time: 10/25/21  4:16 AM  Result Value Ref Range   Magnesium 1.8 1.7 - 2.4 mg/dL    Comment: Performed at [facility], [address] [place], [place] [address]  Glucose, capillary     Status: Abnormal   Collection Time: 10/25/21  8:05 AM  Result Value Ref Range   Glucose-Capillary 168 (H) 70 - 99 mg/dL    Comment: Glucose reference range applies only to samples taken after fasting for at least 8 hours.  Glucose, capillary     Status: Abnormal   Collection Time: 10/25/21 11:23 AM  Result Value Ref Range   Glucose-Capillary 201 (H) 70 - 99 mg/dL    Comment: Glucose reference range applies only to samples taken after fasting for at least 8 hours.  Glucose, capillary     Status: Abnormal   Collection Time: 10/25/21  4:38 PM  Result Value  Ref Range   Glucose-Capillary 113 (H) 70 - 99 mg/dL    Comment: Glucose reference range applies only to samples taken after fasting for at least 8 hours.  MRSA Next Gen by PCR, Nasal     Status: None   Collection Time: 10/25/21  5:52 PM   Specimen: Nasal Mucosa; Nasal Swab  Result Value Ref Range   MRSA by PCR Next Gen NOT DETECTED NOT DETECTED    Comment: (NOTE) The GeneXpert MRSA Assay (FDA approved for NASAL specimens only), is one component of a comprehensive MRSA colonization surveillance program. It is not intended to diagnose MRSA infection nor to guide or monitor treatment for MRSA infections. Test performance is not FDA approved in patients less than 10 years old. Performed at [facility], [address] [place], [place] [address]   Glucose, capillary     Status: Abnormal   Collection Time: 10/25/21  9:23 PM  Result Value Ref Range   Glucose-Capillary 226 (H) 70 - 99 mg/dL    Comment: Glucose reference range applies only to samples taken after fasting for at least 8 hours.  Basic metabolic panel     Status: Abnormal   Collection Time: 10/26/21  4:16 AM  Result Value Ref Range   Sodium 134 (L) 135 - 145 mmol/L   Potassium 4.1 3.5 - 5.1 mmol/L   Chloride 97 (L) 98 - 111 mmol/L   CO2 29 22 - 32 mmol/L   Glucose, Bld 165 (H) 70 - 99 mg/dL    Comment: Glucose reference range applies only to samples taken after fasting for at least 8 hours.   BUN 24 (H) 8 - 23 mg/dL   Creatinine, Ser 3.16 (H) 0.44 - 1.00 mg/dL   Calcium 8.1 (L) 8.9 - 10.3 mg/dL   GFR, Estimated 14 (L) >60 mL/min  Comment: (NOTE) Calculated using the CKD-EPI Creatinine Equation (2021)    Anion gap 8 5 - 15    Comment: Performed at [facility], [address] [place], [place] [address]  Glucose, capillary     Status: Abnormal   Collection Time: 10/26/21  7:37 AM  Result Value Ref Range   Glucose-Capillary 170 (H) 70 - 99 mg/dL    Comment: Glucose reference range applies only to  samples taken after fasting for at least 8 hours.  Glucose, capillary     Status: Abnormal   Collection Time: 10/26/21  8:46 AM  Result Value Ref Range   Glucose-Capillary 158 (H) 70 - 99 mg/dL    Comment: Glucose reference range applies only to samples taken after fasting for at least 8 hours.  Glucose, capillary     Status: Abnormal   Collection Time: 10/26/21 11:18 AM  Result Value Ref Range   Glucose-Capillary 140 (H) 70 - 99 mg/dL    Comment: Glucose reference range applies only to samples taken after fasting for at least 8 hours.  Glucose, capillary     Status: Abnormal   Collection Time: 10/26/21  4:44 PM  Result Value Ref Range   Glucose-Capillary 227 (H) 70 - 99 mg/dL    Comment: Glucose reference range applies only to samples taken after fasting for at least 8 hours.  Glucose, capillary     Status: Abnormal   Collection Time: 10/26/21  9:44 PM  Result Value Ref Range   Glucose-Capillary 217 (H) 70 - 99 mg/dL    Comment: Glucose reference range applies only to samples taken after fasting for at least 8 hours.  Basic metabolic panel     Status: Abnormal   Collection Time: 10/27/21  5:32 AM  Result Value Ref Range   Sodium 129 (L) 135 - 145 mmol/L   Potassium 4.0 3.5 - 5.1 mmol/L   Chloride 88 (L) 98 - 111 mmol/L   CO2 27 22 - 32 mmol/L   Glucose, Bld 190 (H) 70 - 99 mg/dL    Comment: Glucose reference range applies only to samples taken after fasting for at least 8 hours.   BUN 38 (H) 8 - 23 mg/dL   Creatinine, Ser 4.08 (H) 0.44 - 1.00 mg/dL   Calcium 8.3 (L) 8.9 - 10.3 mg/dL   GFR, Estimated 11 (L) >60 mL/min    Comment: (NOTE) Calculated using the CKD-EPI Creatinine Equation (2021)    Anion gap 14 5 - 15    Comment: Performed at [facility], [address] [place], [place] [address]  Glucose, capillary     Status: Abnormal   Collection Time: 10/27/21  7:34 AM  Result Value Ref Range   Glucose-Capillary 197 (H) 70 - 99 mg/dL    Comment: Glucose  reference range applies only to samples taken after fasting for at least 8 hours.  Glucose, capillary     Status: Abnormal   Collection Time: 10/27/21  1:04 PM  Result Value Ref Range   Glucose-Capillary 173 (H) 70 - 99 mg/dL    Comment: Glucose reference range applies only to samples taken after fasting for at least 8 hours.  Glucose, capillary     Status: Abnormal   Collection Time: 10/27/21  4:03 PM  Result Value Ref Range   Glucose-Capillary 180 (H) 70 - 99 mg/dL    Comment: Glucose reference range applies only to samples taken after fasting for at least 8 hours.  Glucose, capillary  Status: Abnormal   Collection Time: 10/27/21  9:49 PM  Result Value Ref Range   Glucose-Capillary 264 (H) 70 - 99 mg/dL    Comment: Glucose reference range applies only to samples taken after fasting for at least 8 hours.  Basic metabolic panel     Status: Abnormal   Collection Time: 10/28/21  5:04 AM  Result Value Ref Range   Sodium 132 (L) 135 - 145 mmol/L   Potassium 3.9 3.5 - 5.1 mmol/L   Chloride 96 (L) 98 - 111 mmol/L   CO2 26 22 - 32 mmol/L   Glucose, Bld 205 (H) 70 - 99 mg/dL    Comment: Glucose reference range applies only to samples taken after fasting for at least 8 hours.   BUN 25 (H) 8 - 23 mg/dL   Creatinine, Ser 3.20 (H) 0.44 - 1.00 mg/dL   Calcium 8.4 (L) 8.9 - 10.3 mg/dL   GFR, Estimated 14 (L) >60 mL/min    Comment: (NOTE) Calculated using the CKD-EPI Creatinine Equation (2021)    Anion gap 10 5 - 15    Comment: Performed at [facility], [address] [place], [place] [address]  CBC     Status: Abnormal   Collection Time: 10/28/21  5:04 AM  Result Value Ref Range   WBC 13.7 (H) 4.0 - 10.5 K/uL   RBC 2.94 (L) 3.87 - 5.11 MIL/uL   Hemoglobin 8.3 (L) 12.0 - 15.0 g/dL   HCT 25.0 (L) 36.0 - 46.0 %   MCV 85.0 80.0 - 100.0 fL   MCH 28.2 26.0 - 34.0 pg   MCHC 33.2 30.0 - 36.0 g/dL   RDW 12.6 11.5 - 15.5 %   Platelets 225 150 - 400 K/uL   nRBC 0.0 0.0 - 0.2 %     Comment: Performed at [facility], [address], [place], [place] [address]  Magnesium     Status: None   Collection Time: 10/28/21  5:04 AM  Result Value Ref Range   Magnesium 1.8 1.7 - 2.4 mg/dL    Comment: Performed at [facility], [address] [place], [place] [address]  Glucose, capillary     Status: Abnormal   Collection Time: 10/28/21  7:44 AM  Result Value Ref Range   Glucose-Capillary 203 (H) 70 - 99 mg/dL    Comment: Glucose reference range applies only to samples taken after fasting for at least 8 hours.  Glucose, capillary     Status: Abnormal   Collection Time: 10/28/21 11:41 AM  Result Value Ref Range   Glucose-Capillary 208 (H) 70 - 99 mg/dL    Comment: Glucose reference range applies only to samples taken after fasting for at least 8 hours.  Glucose, capillary     Status: Abnormal   Collection Time: 10/28/21  4:10 PM  Result Value Ref Range   Glucose-Capillary 105 (H) 70 - 99 mg/dL    Comment: Glucose reference range applies only to samples taken after fasting for at least 8 hours.  Glucose, capillary     Status: Abnormal   Collection Time: 10/28/21  9:53 PM  Result Value Ref Range   Glucose-Capillary 252 (H) 70 - 99 mg/dL    Comment: Glucose reference range applies only to samples taken after fasting for at least 8 hours.  Basic metabolic panel     Status: Abnormal   Collection Time: 10/29/21  4:44 AM  Result Value Ref Range   Sodium 134 (L) 135 - 145 mmol/L  Potassium 3.9 3.5 - 5.1 mmol/L   Chloride 98 98 - 111 mmol/L   CO2 27 22 - 32 mmol/L   Glucose, Bld 176 (H) 70 - 99 mg/dL    Comment: Glucose reference range applies only to samples taken after fasting for at least 8 hours.   BUN 22 8 - 23 mg/dL   Creatinine, Ser 2.53 (H) 0.44 - 1.00 mg/dL   Calcium 8.3 (L) 8.9 - 10.3 mg/dL   GFR, Estimated 19 (L) >60 mL/min    Comment: (NOTE) Calculated using the CKD-EPI Creatinine Equation (2021)    Anion gap 9 5 - 15    Comment: Performed  at [facility], [address] [place], [place] [address]  CBC     Status: Abnormal   Collection Time: 10/29/21  4:44 AM  Result Value Ref Range   WBC 10.3 4.0 - 10.5 K/uL   RBC 2.47 (L) 3.87 - 5.11 MIL/uL   Hemoglobin 7.3 (L) 12.0 - 15.0 g/dL   HCT 21.6 (L) 36.0 - 46.0 %   MCV 87.4 80.0 - 100.0 fL   MCH 29.6 26.0 - 34.0 pg   MCHC 33.8 30.0 - 36.0 g/dL   RDW 12.7 11.5 - 15.5 %   Platelets 195 150 - 400 K/uL   nRBC 0.0 0.0 - 0.2 %    Comment: Performed at [facility], [address], [place], [place] [address]  Magnesium     Status: Abnormal   Collection Time: 10/29/21  4:44 AM  Result Value Ref Range   Magnesium 1.6 (L) 1.7 - 2.4 mg/dL    Comment: Performed at [facility] [address] [place], [place] [address]  Glucose, capillary     Status: Abnormal   Collection Time: 10/29/21 11:45 AM  Result Value Ref Range   Glucose-Capillary 292 (H) 70 - 99 mg/dL    Comment: Glucose reference range applies only to samples taken after fasting for at least 8 hours.  Glucose, capillary     Status: Abnormal   Collection Time: 10/29/21  4:06 PM  Result Value Ref Range   Glucose-Capillary 273 (H) 70 - 99 mg/dL    Comment: Glucose reference range applies only to samples taken after fasting for at least 8 hours.  Glucose, capillary     Status: Abnormal   Collection Time: 10/29/21  8:18 PM  Result Value Ref Range   Glucose-Capillary 296 (H) 70 - 99 mg/dL    Comment: Glucose reference range applies only to samples taken after fasting for at least 8 hours.  Glucose, capillary     Status: Abnormal   Collection Time: 10/30/21  7:42 AM  Result Value Ref Range   Glucose-Capillary 179 (H) 70 - 99 mg/dL    Comment: Glucose reference range applies only to samples taken after fasting for at least 8 hours.  Glucose, capillary     Status: Abnormal   Collection Time: 10/30/21 11:46 AM  Result Value Ref Range   Glucose-Capillary 263 (H) 70 - 99 mg/dL    Comment:  Glucose reference range applies only to samples taken after fasting for at least 8 hours.  Glucose, capillary     Status: Abnormal   Collection Time: 10/30/21  3:40 PM  Result Value Ref Range   Glucose-Capillary 203 (H) 70 - 99 mg/dL    Comment: Glucose reference range applies only to samples taken after fasting for at least 8 hours.    Radiology PERIPHERAL VASCULAR CATHETERIZATION  Result Date: 10/26/2021  See surgical note for result.   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  To get a follow-up duplex next month in the office.  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  Has undergone left renal artery stent placement but developed worsening renal dysfunction after stent placement and is now on dialysis.  We discussed treating her right renal artery stenosis in hopes of improving her blood pressure control as well as potentially improving renal function.  Type II diabetes mellitus with renal manifestations (HCC) blood glucose control important in reducing the progression of atherosclerotic disease. Also, involved in wound healing. On appropriate medications.   Acute renal failure superimposed on stage 3a chronic kidney disease ([place]) Now on dialysis.  We discussed that treating her right renal artery stenosis has some chance of improving her renal dysfunction but there is certainly no guarantee.  Renal artery stenosis (HCC) Has undergone left renal artery stent placement but developed worsening renal dysfunction and is now on dialysis.  Has a known right renal artery stenosis.  We discussed that treating the right renal artery stenosis may improve her blood pressure and there is a chance it could get her off dialysis although that is difficult to discern.  I will discuss with her nephrologist if he thinks it is reasonable we can consider renal artery intervention in the next couple of weeks.  She would be very desirous of this if  there is any chance of getting her off dialysis.    [name], MD  11/24/2021 12:50 PM    This note was created with Dragon medical transcription system.  Any errors from dictation are purely unintentional

## 2021-11-24 NOTE — Assessment & Plan Note (Signed)
With bilateral renal artery stenosis.  Has undergone left renal artery stent placement but developed worsening renal dysfunction after stent placement and is now on dialysis.  We discussed treating her right renal artery stenosis in hopes of improving her blood pressure control as well as potentially improving renal function.

## 2021-11-24 NOTE — Assessment & Plan Note (Signed)
Now on dialysis.  We discussed that treating her right renal artery stenosis has some chance of improving her renal dysfunction but there is certainly no guarantee.

## 2021-11-24 NOTE — Assessment & Plan Note (Signed)
blood glucose control important in reducing the progression of atherosclerotic disease. Also, involved in wound healing. On appropriate medications.  

## 2021-11-24 NOTE — Assessment & Plan Note (Signed)
Status post stent graft repair.  To get a follow-up duplex next month in the office.

## 2021-11-24 NOTE — Progress Notes (Signed)
MRN : [id number]  Dawn Ford is a 76 y.o. (02/22/1945) female who presents with chief complaint of No chief complaint on file. Marland Kitchen  History of Present Illness: Patient returns today in follow up of her renal stenosis.  She came in with acute kidney injury and underwent a left renal stent placement a few weeks ago.  She developed worsening renal dysfunction and progressed to dialysis and she is still on dialysis at this time.  She had a known right renal artery stenosis as well but we do not treat both sides concomitantly.  She had some perinephric hematoma with her renal stent placement previously on the left.  She is very desirous of getting off of dialysis if at all possible.  She has recently had a 24-hour creatinine clearance checked by nephrology and goes back to see them in about a week and a half to get the results. 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  This is to be checked again next month with duplex.  Current Outpatient Medications  Medication Sig Dispense Refill   aspirin EC 81 MG tablet Take 81 mg by mouth daily.     atorvastatin (LIPITOR) 40 MG tablet Take 40 mg by mouth every evening.     carvedilol (COREG) 25 MG tablet Take 1 tablet (25 mg total) by mouth 2 (two) times daily with a meal. 60 tablet 2   cloNIDine (CATAPRES) 0.2 MG tablet Take 1 tablet (0.2 mg total) by mouth 2 (two) times daily. 60 tablet 11   clopidogrel (PLAVIX) 75 MG tablet Take 1 tablet (75 mg total) by mouth daily. 30 tablet 2   famotidine (PEPCID) 40 MG tablet Take 40 mg by mouth at bedtime.     fexofenadine (ALLEGRA) 180 MG tablet Take 180 mg by mouth daily as needed for allergies.     furosemide (LASIX) 40 MG tablet Take 1 tablet (40 mg total) by mouth daily. 30 tablet 2   glimepiride (AMARYL) 4 MG tablet Take 2 mg by mouth 2 (two) times daily.     hydrALAZINE (APRESOLINE) 100 MG tablet Take 1 tablet (100 mg total) by mouth  3 (three) times daily. 90 tablet 2   liraglutide (VICTOZA) 18 MG/3ML SOPN Inject 1.8 mg into the skin every evening.     NIFEdipine (ADALAT CC) 60 MG 24 hr tablet Take 1 tablet (60 mg total) by mouth daily. 30 tablet 2   pioglitazone (ACTOS) 15 MG tablet Take 7.5 mg by mouth daily.     No current facility-administered medications for this visit.    Past Medical History:  Diagnosis Date   Aneurysm of right common iliac artery ([place]) 01/19/2021   a.) US aorta 01/19/2021: measure 2.8 cm. b.) CTA abdomen/pelvis 02/13/2021: measured 2.9 x 2.7 cm.   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   a.) CTA abdomen/pelvis: measured 2.6 cm   GERD (gastroesophageal reflux disease)    Hyperlipemia    Hypertension    NSTEMI (non-ST elevated myocardial infarction) ([place]) 02/17/1999   a.) LHC 02/17/1999 --> 75% LAD and subtotal RCA with thrombus --> PCI performed placing stent (unknown type) to RCA   T2DM (type 2 diabetes mellitus) ([place])     Past Surgical History:  Procedure Laterality Date   CATARACT EXTRACTION, BILATERAL     COLONOSCOPY WITH PROPOFOL N/A 09/01/2018   Procedure: COLONOSCOPY WITH PROPOFOL;  Surgeon: Toledo, Benay Pike, MD;  Location: [facility];  Service: Gastroenterology;  Laterality: N/A;   CORONARY ANGIOPLASTY WITH STENT PLACEMENT     DIALYSIS/PERMA CATHETER INSERTION N/A 10/26/2021   Procedure: DIALYSIS/PERMA CATHETER INSERTION;  Surgeon: Algernon Huxley, MD;  Location: [facility] [facility];  Service: Cardiovascular;  Laterality: N/A;   ENDOVASCULAR REPAIR/STENT GRAFT N/A 03/01/2021   Procedure: ENDOVASCULAR REPAIR/STENT GRAFT;  Surgeon: Algernon Huxley, MD;  Location: [place] [facility];  Service: Cardiovascular;  Laterality: N/A;   ESOPHAGOGASTRODUODENOSCOPY (EGD) WITH PROPOFOL N/A 03/13/2017    Procedure: ESOPHAGOGASTRODUODENOSCOPY (EGD) WITH PROPOFOL;  Surgeon: Virgel Manifold, MD;  Location: [facility];  Service: Endoscopy;  Laterality: N/A;   KNEE ARTHROSCOPY     RENAL ANGIOGRAPHY Bilateral 10/16/2021   Procedure: RENAL ANGIOGRAPHY;  Surgeon: Algernon Huxley, MD;  Location: [place] [facility];  Service: Cardiovascular;  Laterality: Bilateral;     Social History   Tobacco Use   Smoking status: Former    Types: Cigarettes    Quit date: 2000    Years since quitting: 23.8   Smokeless tobacco: Never  Vaping Use   Vaping Use: Never used  Substance Use Topics   Alcohol use: Yes    Comment: rarely   Drug use: No       Family History  Problem Relation Age of Onset   Breast cancer Other   No bleeding disorders, clotting disorders, autoimmune diseases, or aneurysms  No Known Allergies   REVIEW OF SYSTEMS (Negative unless checked)  Constitutional: [] Weight loss  [] Fever  [] Chills Cardiac: [] Chest pain   [] Chest pressure   [] Palpitations   [] Shortness of breath when laying flat   [] Shortness of breath at rest   [] Shortness of breath with exertion. Vascular:  [] Pain in legs with walking   [] Pain in legs at rest   [] Pain in legs when laying flat   [] Claudication   [] Pain in feet when walking  [] Pain in feet at rest  [] Pain in feet when laying flat   [] History of DVT   [] Phlebitis   [x] Swelling in legs   [] Varicose veins   [] Non-healing ulcers Pulmonary:   [] Uses home oxygen   [] Productive cough   [] Hemoptysis   [] Wheeze  [] COPD   [] Asthma Neurologic:  [] Dizziness  [] Blackouts   [] Seizures   [] History of stroke   [] History of TIA  [] Aphasia   [] Temporary blindness   [] Dysphagia   [] Weakness or numbness in arms   [] Weakness or numbness in legs Musculoskeletal:  [x] Arthritis   [] Joint swelling   [] Joint pain   [] Low back pain Hematologic:  [] Easy bruising  [] Easy bleeding   [] Hypercoagulable state   [x] Anemic   Gastrointestinal:  [] Blood in stool   [] Vomiting blood   [] Gastroesophageal reflux/heartburn   [] Abdominal pain Genitourinary:  [x] Chronic kidney disease   [] Difficult urination  [] Frequent urination  [] Burning with urination   [] Hematuria Skin:  [] Rashes   [] Ulcers   [] Wounds Psychological:  [] History of anxiety   []  History of major depression.  Physical Examination  BP (!) 157/74 (BP Location: Left Arm)   Pulse 94   Resp 19   Ht 5\' 6"  (1.676 m)   Wt 170 lb 6.4 oz (77.3 kg)   BMI 27.50 kg/m  Gen:  WD/WN, NAD.  Appears far younger than stated age Head: [place]/AT, No temporalis wasting. Ear/Nose/Throat:  Hearing grossly intact, nares w/o erythema or drainage Eyes: Conjunctiva clear. Sclera non-icteric Neck: Supple.  Trachea midline Pulmonary:  Good air movement, no use of accessory muscles.  Cardiac: RRR, no JVD Vascular:  Vessel Right Left  Radial Palpable Palpable           Musculoskeletal: M/S 5/5 throughout.  No deformity or atrophy.  Mild lower extremity edema. Neurologic: Sensation grossly intact in extremities.  Symmetrical.  Speech is fluent.  Psychiatric: Judgment intact, Mood & affect appropriate for pt's clinical situation. Dermatologic: No rashes or ulcers noted.  No cellulitis or open wounds.      Labs Recent Results (from the past 2160 hour(s))  Basic metabolic panel     Status: Abnormal   Collection Time: 10/11/21 12:18 PM  Result Value Ref Range   Sodium 136 135 - 145 mmol/L   Potassium 4.8 3.5 - 5.1 mmol/L   Chloride 107 98 - 111 mmol/L   CO2 21 (L) 22 - 32 mmol/L   Glucose, Bld 218 (H) 70 - 99 mg/dL    Comment: Glucose reference range applies only to samples taken after fasting for at least 8 hours.   BUN 37 (H) 8 - 23 mg/dL   Creatinine, Ser 2.62 (H) 0.44 - 1.00 mg/dL   Calcium 9.0 8.9 - 10.3 mg/dL   GFR, Estimated 18 (L) >60 mL/min    Comment: (NOTE) Calculated using the CKD-EPI Creatinine Equation (2021)    Anion gap 8 5 - 15    Comment: Performed at [facility], [address]  [place], [place] [address]  CBC     Status: Abnormal   Collection Time: 10/11/21 12:18 PM  Result Value Ref Range   WBC 7.7 4.0 - 10.5 K/uL   RBC 4.17 3.87 - 5.11 MIL/uL   Hemoglobin 11.7 (L) 12.0 - 15.0 g/dL   HCT 36.6 36.0 - 46.0 %   MCV 87.8 80.0 - 100.0 fL   MCH 28.1 26.0 - 34.0 pg   MCHC 32.0 30.0 - 36.0 g/dL   RDW 13.7 11.5 - 15.5 %   Platelets 222 150 - 400 K/uL   nRBC 0.0 0.0 - 0.2 %    Comment: Performed at [facility], [place], [place] [address]  Troponin I (High Sensitivity)     Status: None   Collection Time: 10/11/21 12:18 PM  Result Value Ref Range   Troponin I (High Sensitivity) 8 <18 ng/L    Comment: (NOTE) Elevated high sensitivity troponin I (hsTnI) values and significant  changes across serial measurements may suggest ACS but many other  chronic and acute conditions are known to elevate hsTnI results.  Refer to the "Links" section for chest pain algorithms and additional  guidance. Performed at [facility], [address] [place], [place] [address]   Hemoglobin A1c     Status: Abnormal   Collection Time: 10/11/21 12:18 PM  Result Value Ref Range   Hgb A1c MFr Bld 5.7 (H) 4.8 - 5.6 %    Comment: (NOTE) Pre diabetes:          5.7%-6.4%  Diabetes:              >6.4%  Glycemic control for   <7.0% adults with diabetes    Mean Plasma Glucose 116.89 mg/dL    Comment: Performed at [facility] [address] [place], [place] [address]  Troponin I (High Sensitivity)     Status: None   Collection Time:  10/11/21  4:04 PM  Result Value Ref Range   Troponin I (High Sensitivity) 8 <18 ng/L    Comment: (NOTE) Elevated high sensitivity troponin I (hsTnI) values and significant  changes across serial measurements may suggest ACS but many other  chronic and acute conditions are known to elevate hsTnI results.  Refer to the "Links" section for chest pain algorithms and additional  guidance. Performed at [facility], [address] [place], [place] [address]   CBG monitoring, ED     Status: Abnormal   Collection Time: 10/11/21 10:11 PM  Result Value Ref Range   Glucose-Capillary 173 (H) 70 - 99 mg/dL    Comment: Glucose reference range applies only to samples taken after fasting for at least 8 hours.  Lipid panel     Status: Abnormal   Collection Time: 10/12/21  5:49 AM  Result Value Ref Range   Cholesterol 105 0 - 200 mg/dL   Triglycerides 57 <150 mg/dL   HDL 33 (L) >40 mg/dL   Total CHOL/HDL Ratio 3.2 RATIO   VLDL 11 0 - 40 mg/dL   LDL Cholesterol 61 0 - 99 mg/dL    Comment:        Total Cholesterol/HDL:CHD Risk Coronary Heart Disease Risk Table                     Men   Women  1/2 Average Risk   3.4   3.3  Average Risk       5.0   4.4  2 X Average Risk   9.6   7.1  3 X Average Risk  23.4   11.0        Use the calculated Patient Ratio above and the CHD Risk Table to determine the patient's CHD Risk.        ATP III CLASSIFICATION (LDL):  <100     mg/dL   Optimal  100-129  mg/dL   Near or Above                    Optimal  130-159  mg/dL   Borderline  160-189  mg/dL   High  >190     mg/dL   Very High Performed at [address] [place], [place] [address]   Basic metabolic panel     Status: Abnormal   Collection Time: 10/12/21  5:49 AM  Result Value Ref Range   Sodium 140 135 - 145 mmol/L   Potassium 4.2 3.5 - 5.1 mmol/L   Chloride 109 98 - 111 mmol/L   CO2 24 22 - 32 mmol/L   Glucose, Bld 85 70 - 99 mg/dL    Comment: Glucose reference range applies only to samples taken after fasting for at least 8 hours.   BUN 40 (H) 8 - 23 mg/dL   Creatinine, Ser 2.34 (H) 0.44 - 1.00 mg/dL   Calcium 8.5 (L) 8.9 - 10.3 mg/dL   GFR, Estimated 21 (L) >60 mL/min    Comment: (NOTE) Calculated using the CKD-EPI Creatinine Equation (2021)    Anion gap 7 5 - 15    Comment: Performed at [facility], [address] [place], [place] [address]  CBG  monitoring, ED     Status: None   Collection Time: 10/12/21  7:49 AM  Result Value Ref Range   Glucose-Capillary 90 70 - 99 mg/dL    Comment: Glucose reference range applies only to samples taken after fasting  for at least 8 hours.  CBG monitoring, ED     Status: Abnormal   Collection Time: 10/12/21 11:43 AM  Result Value Ref Range   Glucose-Capillary 220 (H) 70 - 99 mg/dL    Comment: Glucose reference range applies only to samples taken after fasting for at least 8 hours.  Protime-INR     Status: None   Collection Time: 10/12/21  2:08 PM  Result Value Ref Range   Prothrombin Time 13.9 11.4 - 15.2 seconds   INR 1.1 0.8 - 1.2    Comment: (NOTE) INR goal varies based on device and disease states. Performed at [facility], [address] [place], [place] [address]   Glucose, capillary     Status: None   Collection Time: 10/12/21  5:16 PM  Result Value Ref Range   Glucose-Capillary 86 70 - 99 mg/dL    Comment: Glucose reference range applies only to samples taken after fasting for at least 8 hours.  Glucose, capillary     Status: Abnormal   Collection Time: 10/12/21  8:40 PM  Result Value Ref Range   Glucose-Capillary 197 (H) 70 - 99 mg/dL    Comment: Glucose reference range applies only to samples taken after fasting for at least 8 hours.   Comment 1 Notify RN   Basic metabolic panel     Status: Abnormal   Collection Time: 10/13/21  5:01 AM  Result Value Ref Range   Sodium 138 135 - 145 mmol/L   Potassium 4.3 3.5 - 5.1 mmol/L   Chloride 109 98 - 111 mmol/L   CO2 22 22 - 32 mmol/L   Glucose, Bld 117 (H) 70 - 99 mg/dL    Comment: Glucose reference range applies only to samples taken after fasting for at least 8 hours.   BUN 40 (H) 8 - 23 mg/dL   Creatinine, Ser 2.47 (H) 0.44 - 1.00 mg/dL   Calcium 8.7 (L) 8.9 - 10.3 mg/dL   GFR, Estimated 19 (L) >60 mL/min    Comment: (NOTE) Calculated using the CKD-EPI Creatinine Equation (2021)    Anion gap 7 5 - 15     Comment: Performed at [facility], [address] [place], [place] [address]  Glucose, capillary     Status: Abnormal   Collection Time: 10/13/21  8:20 AM  Result Value Ref Range   Glucose-Capillary 157 (H) 70 - 99 mg/dL    Comment: Glucose reference range applies only to samples taken after fasting for at least 8 hours.  Glucose, capillary     Status: Abnormal   Collection Time: 10/13/21 11:41 AM  Result Value Ref Range   Glucose-Capillary 242 (H) 70 - 99 mg/dL    Comment: Glucose reference range applies only to samples taken after fasting for at least 8 hours.  Glucose, capillary     Status: Abnormal   Collection Time: 10/13/21  4:51 PM  Result Value Ref Range   Glucose-Capillary 219 (H) 70 - 99 mg/dL    Comment: Glucose reference range applies only to samples taken after fasting for at least 8 hours.  Glucose, capillary     Status: Abnormal   Collection Time: 10/13/21  8:09 PM  Result Value Ref Range   Glucose-Capillary 203 (H) 70 - 99 mg/dL    Comment: Glucose reference range applies only to samples taken after fasting for at least 8 hours.  Glucose, capillary     Status: None   Collection Time: 10/14/21 12:49 AM  Result  Value Ref Range   Glucose-Capillary 92 70 - 99 mg/dL    Comment: Glucose reference range applies only to samples taken after fasting for at least 8 hours.  Glucose, capillary     Status: Abnormal   Collection Time: 10/14/21  5:17 AM  Result Value Ref Range   Glucose-Capillary 104 (H) 70 - 99 mg/dL    Comment: Glucose reference range applies only to samples taken after fasting for at least 8 hours.   Comment 1 Notify RN   Glucose, capillary     Status: Abnormal   Collection Time: 10/14/21  7:45 AM  Result Value Ref Range   Glucose-Capillary 142 (H) 70 - 99 mg/dL    Comment: Glucose reference range applies only to samples taken after fasting for at least 8 hours.  Basic metabolic panel     Status: Abnormal   Collection Time: 10/14/21  9:00 AM   Result Value Ref Range   Sodium 137 135 - 145 mmol/L   Potassium 4.2 3.5 - 5.1 mmol/L   Chloride 111 98 - 111 mmol/L   CO2 22 22 - 32 mmol/L   Glucose, Bld 148 (H) 70 - 99 mg/dL    Comment: Glucose reference range applies only to samples taken after fasting for at least 8 hours.   BUN 37 (H) 8 - 23 mg/dL   Creatinine, Ser 2.39 (H) 0.44 - 1.00 mg/dL   Calcium 8.8 (L) 8.9 - 10.3 mg/dL   GFR, Estimated 20 (L) >60 mL/min    Comment: (NOTE) Calculated using the CKD-EPI Creatinine Equation (2021)    Anion gap 4 (L) 5 - 15    Comment: Performed at [facility], [address] [place], [place] [address]  Glucose, capillary     Status: Abnormal   Collection Time: 10/14/21 12:10 PM  Result Value Ref Range   Glucose-Capillary 278 (H) 70 - 99 mg/dL    Comment: Glucose reference range applies only to samples taken after fasting for at least 8 hours.  Glucose, capillary     Status: Abnormal   Collection Time: 10/14/21  4:17 PM  Result Value Ref Range   Glucose-Capillary 181 (H) 70 - 99 mg/dL    Comment: Glucose reference range applies only to samples taken after fasting for at least 8 hours.  Glucose, capillary     Status: Abnormal   Collection Time: 10/14/21  8:51 PM  Result Value Ref Range   Glucose-Capillary 190 (H) 70 - 99 mg/dL    Comment: Glucose reference range applies only to samples taken after fasting for at least 8 hours.   Comment 1 Notify RN   Glucose, capillary     Status: Abnormal   Collection Time: 10/15/21  8:27 AM  Result Value Ref Range   Glucose-Capillary 128 (H) 70 - 99 mg/dL    Comment: Glucose reference range applies only to samples taken after fasting for at least 8 hours.  Basic metabolic panel     Status: Abnormal   Collection Time: 10/15/21 10:14 AM  Result Value Ref Range   Sodium 137 135 - 145 mmol/L   Potassium 4.4 3.5 - 5.1 mmol/L   Chloride 109 98 - 111 mmol/L   CO2 20 (L) 22 - 32 mmol/L   Glucose, Bld 279 (H) 70 - 99 mg/dL    Comment:  Glucose reference range applies only to samples taken after fasting for at least 8 hours.   BUN 36 (H) 8 - 23 mg/dL   Creatinine, Ser  2.32 (H) 0.44 - 1.00 mg/dL   Calcium 9.0 8.9 - 10.3 mg/dL   GFR, Estimated 21 (L) >60 mL/min    Comment: (NOTE) Calculated using the CKD-EPI Creatinine Equation (2021)    Anion gap 8 5 - 15    Comment: Performed at [facility], [address] [place], [place] [address]  Glucose, capillary     Status: Abnormal   Collection Time: 10/15/21  1:21 PM  Result Value Ref Range   Glucose-Capillary 236 (H) 70 - 99 mg/dL    Comment: Glucose reference range applies only to samples taken after fasting for at least 8 hours.  Glucose, capillary     Status: Abnormal   Collection Time: 10/15/21  5:33 PM  Result Value Ref Range   Glucose-Capillary 160 (H) 70 - 99 mg/dL    Comment: Glucose reference range applies only to samples taken after fasting for at least 8 hours.  Glucose, capillary     Status: Abnormal   Collection Time: 10/15/21  8:28 PM  Result Value Ref Range   Glucose-Capillary 212 (H) 70 - 99 mg/dL    Comment: Glucose reference range applies only to samples taken after fasting for at least 8 hours.  Glucose, capillary     Status: Abnormal   Collection Time: 10/15/21  9:24 PM  Result Value Ref Range   Glucose-Capillary 210 (H) 70 - 99 mg/dL    Comment: Glucose reference range applies only to samples taken after fasting for at least 8 hours.  Basic metabolic panel     Status: Abnormal   Collection Time: 10/16/21  4:34 AM  Result Value Ref Range   Sodium 135 135 - 145 mmol/L   Potassium 4.6 3.5 - 5.1 mmol/L   Chloride 107 98 - 111 mmol/L   CO2 21 (L) 22 - 32 mmol/L   Glucose, Bld 145 (H) 70 - 99 mg/dL    Comment: Glucose reference range applies only to samples taken after fasting for at least 8 hours.   BUN 38 (H) 8 - 23 mg/dL   Creatinine, Ser 2.19 (H) 0.44 - 1.00 mg/dL   Calcium 8.9 8.9 - 10.3 mg/dL   GFR, Estimated 23 (L) >60 mL/min     Comment: (NOTE) Calculated using the CKD-EPI Creatinine Equation (2021)    Anion gap 7 5 - 15    Comment: Performed at [facility], [address] [place], [place] [address]  Glucose, capillary     Status: Abnormal   Collection Time: 10/16/21  7:54 AM  Result Value Ref Range   Glucose-Capillary 150 (H) 70 - 99 mg/dL    Comment: Glucose reference range applies only to samples taken after fasting for at least 8 hours.  Glucose, capillary     Status: Abnormal   Collection Time: 10/16/21 11:28 AM  Result Value Ref Range   Glucose-Capillary 138 (H) 70 - 99 mg/dL    Comment: Glucose reference range applies only to samples taken after fasting for at least 8 hours.  BUN     Status: Abnormal   Collection Time: 10/16/21  2:39 PM  Result Value Ref Range   BUN 36 (H) 8 - 23 mg/dL    Comment: Performed at [facility], [address] [place], [place] [address]  Creatinine, serum     Status: Abnormal   Collection Time: 10/16/21  2:39 PM  Result Value Ref Range   Creatinine, Ser 2.28 (H) 0.44 - 1.00 mg/dL   GFR, Estimated 21 (L) >  60 mL/min    Comment: (NOTE) Calculated using the CKD-EPI Creatinine Equation (2021) Performed at [facility], [address] [place], [place] [address]   Glucose, capillary     Status: Abnormal   Collection Time: 10/16/21  4:28 PM  Result Value Ref Range   Glucose-Capillary 295 (H) 70 - 99 mg/dL    Comment: Glucose reference range applies only to samples taken after fasting for at least 8 hours.  Glucose, capillary     Status: Abnormal   Collection Time: 10/16/21 10:06 PM  Result Value Ref Range   Glucose-Capillary 128 (H) 70 - 99 mg/dL    Comment: Glucose reference range applies only to samples taken after fasting for at least 8 hours.  Basic metabolic panel     Status: Abnormal   Collection Time: 10/17/21  3:29 AM  Result Value Ref Range   Sodium 134 (L) 135 - 145 mmol/L   Potassium 5.1 3.5 - 5.1 mmol/L   Chloride 104 98 -  111 mmol/L   CO2 18 (L) 22 - 32 mmol/L   Glucose, Bld 170 (H) 70 - 99 mg/dL    Comment: Glucose reference range applies only to samples taken after fasting for at least 8 hours.   BUN 40 (H) 8 - 23 mg/dL   Creatinine, Ser 2.98 (H) 0.44 - 1.00 mg/dL   Calcium 8.8 (L) 8.9 - 10.3 mg/dL   GFR, Estimated 16 (L) >60 mL/min    Comment: (NOTE) Calculated using the CKD-EPI Creatinine Equation (2021)    Anion gap 12 5 - 15    Comment: Performed at [facility], [address] [place],  [address]  CBC     Status: Abnormal   Collection Time: 10/17/21  3:29 AM  Result Value Ref Range   WBC 13.0 (H) 4.0 - 10.5 K/uL   RBC 3.50 (L) 3.87 - 5.11 MIL/uL   Hemoglobin 9.8 (L) 12.0 - 15.0 g/dL   HCT 29.7 (L) 36.0 - 46.0 %   MCV 84.9 80.0 - 100.0 fL   MCH 28.0 26.0 - 34.0 pg   MCHC 33.0 30.0 - 36.0 g/dL   RDW 13.7 11.5 - 15.5 %   Platelets 175 150 - 400 K/uL   nRBC 0.0 0.0 - 0.2 %    Comment: Performed at [facility], [address] [place], [place] [address]  Glucose, capillary     Status: Abnormal   Collection Time: 10/17/21  7:33 AM  Result Value Ref Range   Glucose-Capillary 158 (H) 70 - 99 mg/dL    Comment: Glucose reference range applies only to samples taken after fasting for at least 8 hours.  Glucose, capillary     Status: Abnormal   Collection Time: 10/17/21 12:11 PM  Result Value Ref Range   Glucose-Capillary 152 (H) 70 - 99 mg/dL    Comment: Glucose reference range applies only to samples taken after fasting for at least 8 hours.  Glucose, capillary     Status: Abnormal   Collection Time: 10/17/21  3:30 PM  Result Value Ref Range   Glucose-Capillary 260 (H) 70 - 99 mg/dL    Comment: Glucose reference range applies only to samples taken after fasting for at least 8 hours.  Glucose, capillary     Status: Abnormal   Collection Time: 10/18/21 12:27 AM  Result Value Ref Range   Glucose-Capillary 220 (H) 70 - 99 mg/dL    Comment: Glucose reference range  applies only to samples taken after  fasting for at least 8 hours.  Glucose, capillary     Status: Abnormal   Collection Time: 10/18/21  8:22 AM  Result Value Ref Range   Glucose-Capillary 145 (H) 70 - 99 mg/dL    Comment: Glucose reference range applies only to samples taken after fasting for at least 8 hours.  Basic metabolic panel     Status: Abnormal   Collection Time: 10/18/21  9:00 AM  Result Value Ref Range   Sodium 131 (L) 135 - 145 mmol/L   Potassium 4.4 3.5 - 5.1 mmol/L   Chloride 105 98 - 111 mmol/L   CO2 18 (L) 22 - 32 mmol/L   Glucose, Bld 144 (H) 70 - 99 mg/dL    Comment: Glucose reference range applies only to samples taken after fasting for at least 8 hours.   BUN 54 (H) 8 - 23 mg/dL   Creatinine, Ser 4.28 (H) 0.44 - 1.00 mg/dL   Calcium 8.6 (L) 8.9 - 10.3 mg/dL   GFR, Estimated 10 (L) >60 mL/min    Comment: (NOTE) Calculated using the CKD-EPI Creatinine Equation (2021)    Anion gap 8 5 - 15    Comment: Performed at [facility], [address] [place], [place] [address]  CBC     Status: Abnormal   Collection Time: 10/18/21  9:00 AM  Result Value Ref Range   WBC 9.3 4.0 - 10.5 K/uL   RBC 3.04 (L) 3.87 - 5.11 MIL/uL   Hemoglobin 8.8 (L) 12.0 - 15.0 g/dL   HCT 25.8 (L) 36.0 - 46.0 %   MCV 84.9 80.0 - 100.0 fL   MCH 28.9 26.0 - 34.0 pg   MCHC 34.1 30.0 - 36.0 g/dL   RDW 13.3 11.5 - 15.5 %   Platelets 155 150 - 400 K/uL   nRBC 0.0 0.0 - 0.2 %    Comment: Performed at [facility], [address], [place], [place] [address]  Magnesium     Status: None   Collection Time: 10/18/21  9:00 AM  Result Value Ref Range   Magnesium 2.0 1.7 - 2.4 mg/dL    Comment: Performed at [facility], [address] [place], [place] [address]  Glucose, capillary     Status: Abnormal   Collection Time: 10/18/21 11:41 AM  Result Value Ref Range   Glucose-Capillary 238 (H) 70 - 99 mg/dL    Comment: Glucose reference range applies only to samples taken  after fasting for at least 8 hours.  Glucose, capillary     Status: Abnormal   Collection Time: 10/18/21  4:36 PM  Result Value Ref Range   Glucose-Capillary 204 (H) 70 - 99 mg/dL    Comment: Glucose reference range applies only to samples taken after fasting for at least 8 hours.  Glucose, capillary     Status: Abnormal   Collection Time: 10/18/21  9:14 PM  Result Value Ref Range   Glucose-Capillary 174 (H) 70 - 99 mg/dL    Comment: Glucose reference range applies only to samples taken after fasting for at least 8 hours.  Basic metabolic panel     Status: Abnormal   Collection Time: 10/19/21  4:52 AM  Result Value Ref Range   Sodium 132 (L) 135 - 145 mmol/L   Potassium 4.8 3.5 - 5.1 mmol/L   Chloride 106 98 - 111 mmol/L   CO2 16 (L) 22 - 32 mmol/L   Glucose, Bld 166 (H) 70 - 99 mg/dL  Comment: Glucose reference range applies only to samples taken after fasting for at least 8 hours.   BUN 52 (H) 8 - 23 mg/dL   Creatinine, Ser 4.21 (H) 0.44 - 1.00 mg/dL   Calcium 8.5 (L) 8.9 - 10.3 mg/dL   GFR, Estimated 10 (L) >60 mL/min    Comment: (NOTE) Calculated using the CKD-EPI Creatinine Equation (2021)    Anion gap 10 5 - 15    Comment: Performed at [facility], [address] [place], [place] [address]  CBC     Status: Abnormal   Collection Time: 10/19/21  4:52 AM  Result Value Ref Range   WBC 9.3 4.0 - 10.5 K/uL   RBC 3.04 (L) 3.87 - 5.11 MIL/uL   Hemoglobin 8.6 (L) 12.0 - 15.0 g/dL   HCT 26.0 (L) 36.0 - 46.0 %   MCV 85.5 80.0 - 100.0 fL   MCH 28.3 26.0 - 34.0 pg   MCHC 33.1 30.0 - 36.0 g/dL   RDW 13.2 11.5 - 15.5 %   Platelets 173 150 - 400 K/uL   nRBC 0.0 0.0 - 0.2 %    Comment: Performed at [facility], [address] [place], [place] [address]  Magnesium     Status: None   Collection Time: 10/19/21  4:52 AM  Result Value Ref Range   Magnesium 2.0 1.7 - 2.4 mg/dL    Comment: Performed at [facility], [address] [place],   [address]  Glucose, capillary     Status: Abnormal   Collection Time: 10/19/21  8:03 AM  Result Value Ref Range   Glucose-Capillary 169 (H) 70 - 99 mg/dL    Comment: Glucose reference range applies only to samples taken after fasting for at least 8 hours.  Glucose, capillary     Status: Abnormal   Collection Time: 10/19/21 11:40 AM  Result Value Ref Range   Glucose-Capillary 199 (H) 70 - 99 mg/dL    Comment: Glucose reference range applies only to samples taken after fasting for at least 8 hours.  Glucose, capillary     Status: Abnormal   Collection Time: 10/19/21  4:39 PM  Result Value Ref Range   Glucose-Capillary 174 (H) 70 - 99 mg/dL    Comment: Glucose reference range applies only to samples taken after fasting for at least 8 hours.  Glucose, capillary     Status: Abnormal   Collection Time: 10/19/21  9:33 PM  Result Value Ref Range   Glucose-Capillary 202 (H) 70 - 99 mg/dL    Comment: Glucose reference range applies only to samples taken after fasting for at least 8 hours.  Basic metabolic panel     Status: Abnormal   Collection Time: 10/20/21  5:40 AM  Result Value Ref Range   Sodium 133 (L) 135 - 145 mmol/L   Potassium 4.6 3.5 - 5.1 mmol/L   Chloride 104 98 - 111 mmol/L   CO2 18 (L) 22 - 32 mmol/L   Glucose, Bld 156 (H) 70 - 99 mg/dL    Comment: Glucose reference range applies only to samples taken after fasting for at least 8 hours.   BUN 61 (H) 8 - 23 mg/dL   Creatinine, Ser 4.93 (H) 0.44 - 1.00 mg/dL   Calcium 8.8 (L) 8.9 - 10.3 mg/dL   GFR, Estimated 9 (L) >60 mL/min    Comment: (NOTE) Calculated using the CKD-EPI Creatinine Equation (2021)    Anion gap 11 5 - 15  Comment: Performed at [facility], [address] [place], [place] [address]  CBC     Status: Abnormal   Collection Time: 10/20/21  5:40 AM  Result Value Ref Range   WBC 8.6 4.0 - 10.5 K/uL   RBC 3.15 (L) 3.87 - 5.11 MIL/uL   Hemoglobin 9.0 (L) 12.0 - 15.0 g/dL   HCT 26.7 (L) 36.0 - 46.0 %    MCV 84.8 80.0 - 100.0 fL   MCH 28.6 26.0 - 34.0 pg   MCHC 33.7 30.0 - 36.0 g/dL   RDW 13.2 11.5 - 15.5 %   Platelets 190 150 - 400 K/uL   nRBC 0.0 0.0 - 0.2 %    Comment: Performed at [facility], [address], [place], [place] [address]  Magnesium     Status: None   Collection Time: 10/20/21  5:40 AM  Result Value Ref Range   Magnesium 2.2 1.7 - 2.4 mg/dL    Comment: Performed at [facility], [address] [place], [place] [address]  Glucose, capillary     Status: Abnormal   Collection Time: 10/20/21  7:46 AM  Result Value Ref Range   Glucose-Capillary 160 (H) 70 - 99 mg/dL    Comment: Glucose reference range applies only to samples taken after fasting for at least 8 hours.  Glucose, capillary     Status: Abnormal   Collection Time: 10/20/21 12:20 PM  Result Value Ref Range   Glucose-Capillary 217 (H) 70 - 99 mg/dL    Comment: Glucose reference range applies only to samples taken after fasting for at least 8 hours.  Glucose, capillary     Status: Abnormal   Collection Time: 10/20/21  5:12 PM  Result Value Ref Range   Glucose-Capillary 241 (H) 70 - 99 mg/dL    Comment: Glucose reference range applies only to samples taken after fasting for at least 8 hours.  Glucose, capillary     Status: Abnormal   Collection Time: 10/20/21  9:07 PM  Result Value Ref Range   Glucose-Capillary 238 (H) 70 - 99 mg/dL    Comment: Glucose reference range applies only to samples taken after fasting for at least 8 hours.  Glucose, capillary     Status: Abnormal   Collection Time: 10/20/21 11:41 PM  Result Value Ref Range   Glucose-Capillary 198 (H) 70 - 99 mg/dL    Comment: Glucose reference range applies only to samples taken after fasting for at least 8 hours.  Basic metabolic panel     Status: Abnormal   Collection Time: 10/21/21  5:26 AM  Result Value Ref Range   Sodium 128 (L) 135 - 145 mmol/L   Potassium 4.6 3.5 - 5.1 mmol/L   Chloride 100 98 - 111 mmol/L   CO2  17 (L) 22 - 32 mmol/L   Glucose, Bld 169 (H) 70 - 99 mg/dL    Comment: Glucose reference range applies only to samples taken after fasting for at least 8 hours.   BUN 70 (H) 8 - 23 mg/dL   Creatinine, Ser 5.37 (H) 0.44 - 1.00 mg/dL   Calcium 8.4 (L) 8.9 - 10.3 mg/dL   GFR, Estimated 8 (L) >60 mL/min    Comment: (NOTE) Calculated using the CKD-EPI Creatinine Equation (2021)    Anion gap 11 5 - 15    Comment: Performed at [facility], [address] [place], [place] [address]  CBC     Status: Abnormal   Collection Time: 10/21/21  5:26 AM  Result Value Ref Range   WBC 8.2 4.0 - 10.5 K/uL   RBC 2.97 (L) 3.87 - 5.11 MIL/uL   Hemoglobin 8.4 (L) 12.0 - 15.0 g/dL   HCT 24.5 (L) 36.0 - 46.0 %   MCV 82.5 80.0 - 100.0 fL   MCH 28.3 26.0 - 34.0 pg   MCHC 34.3 30.0 - 36.0 g/dL   RDW 12.6 11.5 - 15.5 %   Platelets 198 150 - 400 K/uL   nRBC 0.0 0.0 - 0.2 %    Comment: Performed at [facility], [address], [place], [place] [address]  Magnesium     Status: None   Collection Time: 10/21/21  5:26 AM  Result Value Ref Range   Magnesium 2.2 1.7 - 2.4 mg/dL    Comment: Performed at [facility], [address] [place], [place] [address]  Glucose, capillary     Status: Abnormal   Collection Time: 10/21/21  8:13 AM  Result Value Ref Range   Glucose-Capillary 169 (H) 70 - 99 mg/dL    Comment: Glucose reference range applies only to samples taken after fasting for at least 8 hours.  Glucose, capillary     Status: Abnormal   Collection Time: 10/21/21 11:20 AM  Result Value Ref Range   Glucose-Capillary 261 (H) 70 - 99 mg/dL    Comment: Glucose reference range applies only to samples taken after fasting for at least 8 hours.  Hepatitis B surface antigen     Status: None   Collection Time: 10/21/21  2:54 PM  Result Value Ref Range   Hepatitis B Surface Ag NON REACTIVE NON REACTIVE    Comment: Performed at [facility], [address], [place], [place]  [address]  Hepatitis B surface antibody     Status: Abnormal   Collection Time: 10/21/21  2:54 PM  Result Value Ref Range   Hep B S Ab Reactive (A) NON REACTIVE    Comment: (NOTE) Consistent with immunity, greater than 9.9 mIU/mL.  Performed at [facility], [place] [address] [place], [place] [address]   Hepatitis B surface antibody,quantitative     Status: None   Collection Time: 10/21/21  2:54 PM  Result Value Ref Range   Hep B S AB Quant (Post) 143.7 Immunity>9.9 mIU/mL    Comment: (NOTE)  Status of Immunity                     Anti-HBs Level  ------------------                     -------------- Inconsistent with Immunity                   0.0 - 9.9 Consistent with Immunity                          >9.9 Performed At: [facility] [address] [place], [place] [id number] Rush Farmer MD ON:[id number]   Hepatitis B core antibody, total     Status: Abnormal   Collection Time: 10/21/21  2:54 PM  Result Value Ref Range   Hep B Core Total Ab Reactive (A) NON REACTIVE    Comment: Performed at [facility], [place] [address], [place], [place] [address]  Hepatitis C antibody     Status: None   Collection Time: 10/21/21  2:54 PM  Result Value Ref Range   HCV  Ab NON REACTIVE NON REACTIVE    Comment: (NOTE) Nonreactive HCV antibody screen is consistent with no HCV infections,  unless recent infection is suspected or other evidence exists to indicate HCV infection.  Performed at [facility], [place] [address] [place], [place] [address]   Glucose, capillary     Status: Abnormal   Collection Time: 10/21/21  4:58 PM  Result Value Ref Range   Glucose-Capillary 229 (H) 70 - 99 mg/dL    Comment: Glucose reference range applies only to samples taken after fasting for at least 8 hours.  Renal function panel     Status: Abnormal   Collection Time: 10/21/21  7:00 PM  Result Value Ref Range   Sodium 131 (L) 135 - 145 mmol/L   Potassium 3.8 3.5 - 5.1 mmol/L    Chloride 96 (L) 98 - 111 mmol/L   CO2 24 22 - 32 mmol/L   Glucose, Bld 129 (H) 70 - 99 mg/dL    Comment: Glucose reference range applies only to samples taken after fasting for at least 8 hours.   BUN 46 (H) 8 - 23 mg/dL   Creatinine, Ser 3.38 (H) 0.44 - 1.00 mg/dL   Calcium 8.2 (L) 8.9 - 10.3 mg/dL   Phosphorus 2.7 2.5 - 4.6 mg/dL   Albumin 3.1 (L) 3.5 - 5.0 g/dL   GFR, Estimated 13 (L) >60 mL/min    Comment: (NOTE) Calculated using the CKD-EPI Creatinine Equation (2021)    Anion gap 11 5 - 15    Comment: Performed at [facility], [address] [place], [place] [address]  CBC     Status: Abnormal   Collection Time: 10/21/21  7:00 PM  Result Value Ref Range   WBC 12.0 (H) 4.0 - 10.5 K/uL   RBC 2.99 (L) 3.87 - 5.11 MIL/uL   Hemoglobin 8.4 (L) 12.0 - 15.0 g/dL   HCT 24.4 (L) 36.0 - 46.0 %   MCV 81.6 80.0 - 100.0 fL   MCH 28.1 26.0 - 34.0 pg   MCHC 34.4 30.0 - 36.0 g/dL   RDW 12.6 11.5 - 15.5 %   Platelets 181 150 - 400 K/uL   nRBC 0.0 0.0 - 0.2 %    Comment: Performed at [facility], [address] [place], [place] [address]  Glucose, capillary     Status: Abnormal   Collection Time: 10/21/21  9:54 PM  Result Value Ref Range   Glucose-Capillary 139 (H) 70 - 99 mg/dL    Comment: Glucose reference range applies only to samples taken after fasting for at least 8 hours.  Basic metabolic panel     Status: Abnormal   Collection Time: 10/22/21  6:42 AM  Result Value Ref Range   Sodium 128 (L) 135 - 145 mmol/L   Potassium 4.2 3.5 - 5.1 mmol/L   Chloride 96 (L) 98 - 111 mmol/L   CO2 22 22 - 32 mmol/L   Glucose, Bld 184 (H) 70 - 99 mg/dL    Comment: Glucose reference range applies only to samples taken after fasting for at least 8 hours.   BUN 51 (H) 8 - 23 mg/dL   Creatinine, Ser 4.71 (H) 0.44 - 1.00 mg/dL   Calcium 8.3 (L) 8.9 - 10.3 mg/dL   GFR, Estimated 9 (L) >60 mL/min    Comment: (NOTE) Calculated using the CKD-EPI Creatinine Equation (2021)     Anion gap 10 5 - 15    Comment: Performed at [facility],  [place], [place] [address]  CBC     Status: Abnormal   Collection Time: 10/22/21  6:42 AM  Result Value Ref Range   WBC 9.2 4.0 - 10.5 K/uL   RBC 3.13 (L) 3.87 - 5.11 MIL/uL   Hemoglobin 8.8 (L) 12.0 - 15.0 g/dL   HCT 25.7 (L) 36.0 - 46.0 %   MCV 82.1 80.0 - 100.0 fL   MCH 28.1 26.0 - 34.0 pg   MCHC 34.2 30.0 - 36.0 g/dL   RDW 12.7 11.5 - 15.5 %   Platelets 214 150 - 400 K/uL   nRBC 0.0 0.0 - 0.2 %    Comment: Performed at [facility], [address] [place], [place] [address]  Magnesium     Status: None   Collection Time: 10/22/21  6:42 AM  Result Value Ref Range   Magnesium 2.0 1.7 - 2.4 mg/dL    Comment: Performed at [facility], [address] [place], [place] [address]  Glucose, capillary     Status: Abnormal   Collection Time: 10/22/21  8:36 AM  Result Value Ref Range   Glucose-Capillary 185 (H) 70 - 99 mg/dL    Comment: Glucose reference range applies only to samples taken after fasting for at least 8 hours.  Vitamin B12     Status: None   Collection Time: 10/22/21 12:31 PM  Result Value Ref Range   Vitamin B-12 369 180 - 914 pg/mL    Comment: (NOTE) This assay is not validated for testing neonatal or myeloproliferative syndrome specimens for Vitamin B12 levels. Performed at [facility], [place] [address], [place], [place] [address]   Folate     Status: None   Collection Time: 10/22/21 12:31 PM  Result Value Ref Range   Folate 13.7 >5.9 ng/mL    Comment: Performed at [facility], [address] [place], [place] [address]  Iron and TIBC     Status: None   Collection Time: 10/22/21 12:31 PM  Result Value Ref Range   Iron 29 28 - 170 ug/dL   TIBC 252 250 - 450 ug/dL   Saturation Ratios 12 10.4 - 31.8 %   UIBC 223 ug/dL    Comment: Performed at [facility], [address] [place], [place] [address]  Ferritin     Status: None    Collection Time: 10/22/21 12:31 PM  Result Value Ref Range   Ferritin 200 11 - 307 ng/mL    Comment: Performed at [facility], [address] [place], [place] [address]  Glucose, capillary     Status: Abnormal   Collection Time: 10/22/21 12:56 PM  Result Value Ref Range   Glucose-Capillary 246 (H) 70 - 99 mg/dL    Comment: Glucose reference range applies only to samples taken after fasting for at least 8 hours.  Glucose, capillary     Status: Abnormal   Collection Time: 10/22/21  5:05 PM  Result Value Ref Range   Glucose-Capillary 250 (H) 70 - 99 mg/dL    Comment: Glucose reference range applies only to samples taken after fasting for at least 8 hours.  Glucose, capillary     Status: Abnormal   Collection Time: 10/22/21  8:49 PM  Result Value Ref Range   Glucose-Capillary 216 (H) 70 - 99 mg/dL    Comment: Glucose reference range applies only to samples taken after fasting for at least 8 hours.  Basic metabolic panel     Status:  Abnormal   Collection Time: 10/23/21  4:34 AM  Result Value Ref Range   Sodium 129 (L) 135 - 145 mmol/L   Potassium 4.3 3.5 - 5.1 mmol/L   Chloride 94 (L) 98 - 111 mmol/L   CO2 24 22 - 32 mmol/L   Glucose, Bld 169 (H) 70 - 99 mg/dL    Comment: Glucose reference range applies only to samples taken after fasting for at least 8 hours.   BUN 59 (H) 8 - 23 mg/dL   Creatinine, Ser 5.18 (H) 0.44 - 1.00 mg/dL   Calcium 8.4 (L) 8.9 - 10.3 mg/dL   GFR, Estimated 8 (L) >60 mL/min    Comment: (NOTE) Calculated using the CKD-EPI Creatinine Equation (2021)    Anion gap 11 5 - 15    Comment: Performed at [facility], [address] [place], [place] [address]  CBC     Status: Abnormal   Collection Time: 10/23/21  4:34 AM  Result Value Ref Range   WBC 8.0 4.0 - 10.5 K/uL   RBC 2.91 (L) 3.87 - 5.11 MIL/uL   Hemoglobin 8.3 (L) 12.0 - 15.0 g/dL   HCT 24.4 (L) 36.0 - 46.0 %   MCV 83.8 80.0 - 100.0 fL   MCH 28.5 26.0 - 34.0 pg   MCHC 34.0 30.0 -  36.0 g/dL   RDW 12.5 11.5 - 15.5 %   Platelets 218 150 - 400 K/uL   nRBC 0.0 0.0 - 0.2 %    Comment: Performed at [facility], [address] [place], [place] [address]  Magnesium     Status: None   Collection Time: 10/23/21  4:34 AM  Result Value Ref Range   Magnesium 2.4 1.7 - 2.4 mg/dL    Comment: Performed at [facility], [address] [place], [place] [address]  Glucose, capillary     Status: Abnormal   Collection Time: 10/23/21  8:01 AM  Result Value Ref Range   Glucose-Capillary 203 (H) 70 - 99 mg/dL    Comment: Glucose reference range applies only to samples taken after fasting for at least 8 hours.  Glucose, capillary     Status: Abnormal   Collection Time: 10/23/21 12:20 PM  Result Value Ref Range   Glucose-Capillary 109 (H) 70 - 99 mg/dL    Comment: Glucose reference range applies only to samples taken after fasting for at least 8 hours.  Glucose, capillary     Status: Abnormal   Collection Time: 10/23/21  4:33 PM  Result Value Ref Range   Glucose-Capillary 197 (H) 70 - 99 mg/dL    Comment: Glucose reference range applies only to samples taken after fasting for at least 8 hours.  Glucose, capillary     Status: Abnormal   Collection Time: 10/23/21  8:39 PM  Result Value Ref Range   Glucose-Capillary 173 (H) 70 - 99 mg/dL    Comment: Glucose reference range applies only to samples taken after fasting for at least 8 hours.  Basic metabolic panel     Status: Abnormal   Collection Time: 10/24/21  5:13 AM  Result Value Ref Range   Sodium 132 (L) 135 - 145 mmol/L   Potassium 4.3 3.5 - 5.1 mmol/L   Chloride 95 (L) 98 - 111 mmol/L   CO2 25 22 - 32 mmol/L   Glucose, Bld 150 (H) 70 - 99 mg/dL    Comment: Glucose reference range applies only to samples taken after fasting for at least 8  hours.   BUN 39 (H) 8 - 23 mg/dL   Creatinine, Ser 3.71 (H) 0.44 - 1.00 mg/dL   Calcium 8.5 (L) 8.9 - 10.3 mg/dL   GFR, Estimated 12 (L) >60 mL/min    Comment:  (NOTE) Calculated using the CKD-EPI Creatinine Equation (2021)    Anion gap 12 5 - 15    Comment: Performed at [facility], [address] [place], [place] [address]  CBC     Status: Abnormal   Collection Time: 10/24/21  5:13 AM  Result Value Ref Range   WBC 8.0 4.0 - 10.5 K/uL   RBC 2.83 (L) 3.87 - 5.11 MIL/uL   Hemoglobin 8.2 (L) 12.0 - 15.0 g/dL   HCT 24.3 (L) 36.0 - 46.0 %   MCV 85.9 80.0 - 100.0 fL   MCH 29.0 26.0 - 34.0 pg   MCHC 33.7 30.0 - 36.0 g/dL   RDW 12.9 11.5 - 15.5 %   Platelets 220 150 - 400 K/uL   nRBC 0.0 0.0 - 0.2 %    Comment: Performed at [facility], [address], [place], [place] [address]  Magnesium     Status: None   Collection Time: 10/24/21  5:13 AM  Result Value Ref Range   Magnesium 1.9 1.7 - 2.4 mg/dL    Comment: Performed at [facility], [address] [place], [place] [address]  Glucose, capillary     Status: Abnormal   Collection Time: 10/24/21  8:01 AM  Result Value Ref Range   Glucose-Capillary 160 (H) 70 - 99 mg/dL    Comment: Glucose reference range applies only to samples taken after fasting for at least 8 hours.  Glucose, capillary     Status: Abnormal   Collection Time: 10/24/21 12:26 PM  Result Value Ref Range   Glucose-Capillary 174 (H) 70 - 99 mg/dL    Comment: Glucose reference range applies only to samples taken after fasting for at least 8 hours.  Glucose, capillary     Status: Abnormal   Collection Time: 10/24/21  4:32 PM  Result Value Ref Range   Glucose-Capillary 205 (H) 70 - 99 mg/dL    Comment: Glucose reference range applies only to samples taken after fasting for at least 8 hours.  Glucose, capillary     Status: Abnormal   Collection Time: 10/24/21  8:52 PM  Result Value Ref Range   Glucose-Capillary 201 (H) 70 - 99 mg/dL    Comment: Glucose reference range applies only to samples taken after fasting for at least 8 hours.  Basic metabolic panel     Status: Abnormal   Collection Time:  10/25/21  4:16 AM  Result Value Ref Range   Sodium 130 (L) 135 - 145 mmol/L   Potassium 4.1 3.5 - 5.1 mmol/L   Chloride 92 (L) 98 - 111 mmol/L   CO2 27 22 - 32 mmol/L   Glucose, Bld 176 (H) 70 - 99 mg/dL    Comment: Glucose reference range applies only to samples taken after fasting for at least 8 hours.   BUN 29 (H) 8 - 23 mg/dL   Creatinine, Ser 3.09 (H) 0.44 - 1.00 mg/dL   Calcium 8.2 (L) 8.9 - 10.3 mg/dL   GFR, Estimated 15 (L) >60 mL/min    Comment: (NOTE) Calculated using the CKD-EPI Creatinine Equation (2021)    Anion gap 11 5 - 15    Comment: Performed at [facility], [address], [place], [place] [address]  CBC     Status: Abnormal   Collection Time: 10/25/21  4:16 AM  Result Value Ref Range   WBC 9.5 4.0 - 10.5 K/uL   RBC 2.92 (L) 3.87 - 5.11 MIL/uL   Hemoglobin 8.2 (L) 12.0 - 15.0 g/dL   HCT 24.5 (L) 36.0 - 46.0 %   MCV 83.9 80.0 - 100.0 fL   MCH 28.1 26.0 - 34.0 pg   MCHC 33.5 30.0 - 36.0 g/dL   RDW 12.6 11.5 - 15.5 %   Platelets 224 150 - 400 K/uL   nRBC 0.0 0.0 - 0.2 %    Comment: Performed at [facility], [address] [place], [place] [address]  Magnesium     Status: None   Collection Time: 10/25/21  4:16 AM  Result Value Ref Range   Magnesium 1.8 1.7 - 2.4 mg/dL    Comment: Performed at [facility], [address] [place], [place] [address]  Glucose, capillary     Status: Abnormal   Collection Time: 10/25/21  8:05 AM  Result Value Ref Range   Glucose-Capillary 168 (H) 70 - 99 mg/dL    Comment: Glucose reference range applies only to samples taken after fasting for at least 8 hours.  Glucose, capillary     Status: Abnormal   Collection Time: 10/25/21 11:23 AM  Result Value Ref Range   Glucose-Capillary 201 (H) 70 - 99 mg/dL    Comment: Glucose reference range applies only to samples taken after fasting for at least 8 hours.  Glucose, capillary     Status: Abnormal   Collection Time: 10/25/21  4:38 PM  Result Value  Ref Range   Glucose-Capillary 113 (H) 70 - 99 mg/dL    Comment: Glucose reference range applies only to samples taken after fasting for at least 8 hours.  MRSA Next Gen by PCR, Nasal     Status: None   Collection Time: 10/25/21  5:52 PM   Specimen: Nasal Mucosa; Nasal Swab  Result Value Ref Range   MRSA by PCR Next Gen NOT DETECTED NOT DETECTED    Comment: (NOTE) The GeneXpert MRSA Assay (FDA approved for NASAL specimens only), is one component of a comprehensive MRSA colonization surveillance program. It is not intended to diagnose MRSA infection nor to guide or monitor treatment for MRSA infections. Test performance is not FDA approved in patients less than 9 years old. Performed at [facility], [address] [place], [place] [address]   Glucose, capillary     Status: Abnormal   Collection Time: 10/25/21  9:23 PM  Result Value Ref Range   Glucose-Capillary 226 (H) 70 - 99 mg/dL    Comment: Glucose reference range applies only to samples taken after fasting for at least 8 hours.  Basic metabolic panel     Status: Abnormal   Collection Time: 10/26/21  4:16 AM  Result Value Ref Range   Sodium 134 (L) 135 - 145 mmol/L   Potassium 4.1 3.5 - 5.1 mmol/L   Chloride 97 (L) 98 - 111 mmol/L   CO2 29 22 - 32 mmol/L   Glucose, Bld 165 (H) 70 - 99 mg/dL    Comment: Glucose reference range applies only to samples taken after fasting for at least 8 hours.   BUN 24 (H) 8 - 23 mg/dL   Creatinine, Ser 3.16 (H) 0.44 - 1.00 mg/dL   Calcium 8.1 (L) 8.9 - 10.3 mg/dL   GFR, Estimated 14 (L) >60 mL/min  Comment: (NOTE) Calculated using the CKD-EPI Creatinine Equation (2021)    Anion gap 8 5 - 15    Comment: Performed at [facility], [address] [place], [place] [address]  Glucose, capillary     Status: Abnormal   Collection Time: 10/26/21  7:37 AM  Result Value Ref Range   Glucose-Capillary 170 (H) 70 - 99 mg/dL    Comment: Glucose reference range applies only to  samples taken after fasting for at least 8 hours.  Glucose, capillary     Status: Abnormal   Collection Time: 10/26/21  8:46 AM  Result Value Ref Range   Glucose-Capillary 158 (H) 70 - 99 mg/dL    Comment: Glucose reference range applies only to samples taken after fasting for at least 8 hours.  Glucose, capillary     Status: Abnormal   Collection Time: 10/26/21 11:18 AM  Result Value Ref Range   Glucose-Capillary 140 (H) 70 - 99 mg/dL    Comment: Glucose reference range applies only to samples taken after fasting for at least 8 hours.  Glucose, capillary     Status: Abnormal   Collection Time: 10/26/21  4:44 PM  Result Value Ref Range   Glucose-Capillary 227 (H) 70 - 99 mg/dL    Comment: Glucose reference range applies only to samples taken after fasting for at least 8 hours.  Glucose, capillary     Status: Abnormal   Collection Time: 10/26/21  9:44 PM  Result Value Ref Range   Glucose-Capillary 217 (H) 70 - 99 mg/dL    Comment: Glucose reference range applies only to samples taken after fasting for at least 8 hours.  Basic metabolic panel     Status: Abnormal   Collection Time: 10/27/21  5:32 AM  Result Value Ref Range   Sodium 129 (L) 135 - 145 mmol/L   Potassium 4.0 3.5 - 5.1 mmol/L   Chloride 88 (L) 98 - 111 mmol/L   CO2 27 22 - 32 mmol/L   Glucose, Bld 190 (H) 70 - 99 mg/dL    Comment: Glucose reference range applies only to samples taken after fasting for at least 8 hours.   BUN 38 (H) 8 - 23 mg/dL   Creatinine, Ser 4.08 (H) 0.44 - 1.00 mg/dL   Calcium 8.3 (L) 8.9 - 10.3 mg/dL   GFR, Estimated 11 (L) >60 mL/min    Comment: (NOTE) Calculated using the CKD-EPI Creatinine Equation (2021)    Anion gap 14 5 - 15    Comment: Performed at [facility], [address] [place], [place] [address]  Glucose, capillary     Status: Abnormal   Collection Time: 10/27/21  7:34 AM  Result Value Ref Range   Glucose-Capillary 197 (H) 70 - 99 mg/dL    Comment: Glucose  reference range applies only to samples taken after fasting for at least 8 hours.  Glucose, capillary     Status: Abnormal   Collection Time: 10/27/21  1:04 PM  Result Value Ref Range   Glucose-Capillary 173 (H) 70 - 99 mg/dL    Comment: Glucose reference range applies only to samples taken after fasting for at least 8 hours.  Glucose, capillary     Status: Abnormal   Collection Time: 10/27/21  4:03 PM  Result Value Ref Range   Glucose-Capillary 180 (H) 70 - 99 mg/dL    Comment: Glucose reference range applies only to samples taken after fasting for at least 8 hours.  Glucose, capillary  Status: Abnormal   Collection Time: 10/27/21  9:49 PM  Result Value Ref Range   Glucose-Capillary 264 (H) 70 - 99 mg/dL    Comment: Glucose reference range applies only to samples taken after fasting for at least 8 hours.  Basic metabolic panel     Status: Abnormal   Collection Time: 10/28/21  5:04 AM  Result Value Ref Range   Sodium 132 (L) 135 - 145 mmol/L   Potassium 3.9 3.5 - 5.1 mmol/L   Chloride 96 (L) 98 - 111 mmol/L   CO2 26 22 - 32 mmol/L   Glucose, Bld 205 (H) 70 - 99 mg/dL    Comment: Glucose reference range applies only to samples taken after fasting for at least 8 hours.   BUN 25 (H) 8 - 23 mg/dL   Creatinine, Ser 3.20 (H) 0.44 - 1.00 mg/dL   Calcium 8.4 (L) 8.9 - 10.3 mg/dL   GFR, Estimated 14 (L) >60 mL/min    Comment: (NOTE) Calculated using the CKD-EPI Creatinine Equation (2021)    Anion gap 10 5 - 15    Comment: Performed at [facility], [address] [place], [place] [address]  CBC     Status: Abnormal   Collection Time: 10/28/21  5:04 AM  Result Value Ref Range   WBC 13.7 (H) 4.0 - 10.5 K/uL   RBC 2.94 (L) 3.87 - 5.11 MIL/uL   Hemoglobin 8.3 (L) 12.0 - 15.0 g/dL   HCT 25.0 (L) 36.0 - 46.0 %   MCV 85.0 80.0 - 100.0 fL   MCH 28.2 26.0 - 34.0 pg   MCHC 33.2 30.0 - 36.0 g/dL   RDW 12.6 11.5 - 15.5 %   Platelets 225 150 - 400 K/uL   nRBC 0.0 0.0 - 0.2 %     Comment: Performed at [facility], [address] [place], [place] [address]  Magnesium     Status: None   Collection Time: 10/28/21  5:04 AM  Result Value Ref Range   Magnesium 1.8 1.7 - 2.4 mg/dL    Comment: Performed at [facility], [address] [place], [place] [address]  Glucose, capillary     Status: Abnormal   Collection Time: 10/28/21  7:44 AM  Result Value Ref Range   Glucose-Capillary 203 (H) 70 - 99 mg/dL    Comment: Glucose reference range applies only to samples taken after fasting for at least 8 hours.  Glucose, capillary     Status: Abnormal   Collection Time: 10/28/21 11:41 AM  Result Value Ref Range   Glucose-Capillary 208 (H) 70 - 99 mg/dL    Comment: Glucose reference range applies only to samples taken after fasting for at least 8 hours.  Glucose, capillary     Status: Abnormal   Collection Time: 10/28/21  4:10 PM  Result Value Ref Range   Glucose-Capillary 105 (H) 70 - 99 mg/dL    Comment: Glucose reference range applies only to samples taken after fasting for at least 8 hours.  Glucose, capillary     Status: Abnormal   Collection Time: 10/28/21  9:53 PM  Result Value Ref Range   Glucose-Capillary 252 (H) 70 - 99 mg/dL    Comment: Glucose reference range applies only to samples taken after fasting for at least 8 hours.  Basic metabolic panel     Status: Abnormal   Collection Time: 10/29/21  4:44 AM  Result Value Ref Range   Sodium 134 (L) 135 - 145 mmol/L  Potassium 3.9 3.5 - 5.1 mmol/L   Chloride 98 98 - 111 mmol/L   CO2 27 22 - 32 mmol/L   Glucose, Bld 176 (H) 70 - 99 mg/dL    Comment: Glucose reference range applies only to samples taken after fasting for at least 8 hours.   BUN 22 8 - 23 mg/dL   Creatinine, Ser 2.53 (H) 0.44 - 1.00 mg/dL   Calcium 8.3 (L) 8.9 - 10.3 mg/dL   GFR, Estimated 19 (L) >60 mL/min    Comment: (NOTE) Calculated using the CKD-EPI Creatinine Equation (2021)    Anion gap 9 5 - 15    Comment: Performed  at [facility], [address] [place], [place] [address]  CBC     Status: Abnormal   Collection Time: 10/29/21  4:44 AM  Result Value Ref Range   WBC 10.3 4.0 - 10.5 K/uL   RBC 2.47 (L) 3.87 - 5.11 MIL/uL   Hemoglobin 7.3 (L) 12.0 - 15.0 g/dL   HCT 21.6 (L) 36.0 - 46.0 %   MCV 87.4 80.0 - 100.0 fL   MCH 29.6 26.0 - 34.0 pg   MCHC 33.8 30.0 - 36.0 g/dL   RDW 12.7 11.5 - 15.5 %   Platelets 195 150 - 400 K/uL   nRBC 0.0 0.0 - 0.2 %    Comment: Performed at [facility], [address], [place], [place] [address]  Magnesium     Status: Abnormal   Collection Time: 10/29/21  4:44 AM  Result Value Ref Range   Magnesium 1.6 (L) 1.7 - 2.4 mg/dL    Comment: Performed at [facility], [address] [place], [place] [address]  Glucose, capillary     Status: Abnormal   Collection Time: 10/29/21 11:45 AM  Result Value Ref Range   Glucose-Capillary 292 (H) 70 - 99 mg/dL    Comment: Glucose reference range applies only to samples taken after fasting for at least 8 hours.  Glucose, capillary     Status: Abnormal   Collection Time: 10/29/21  4:06 PM  Result Value Ref Range   Glucose-Capillary 273 (H) 70 - 99 mg/dL    Comment: Glucose reference range applies only to samples taken after fasting for at least 8 hours.  Glucose, capillary     Status: Abnormal   Collection Time: 10/29/21  8:18 PM  Result Value Ref Range   Glucose-Capillary 296 (H) 70 - 99 mg/dL    Comment: Glucose reference range applies only to samples taken after fasting for at least 8 hours.  Glucose, capillary     Status: Abnormal   Collection Time: 10/30/21  7:42 AM  Result Value Ref Range   Glucose-Capillary 179 (H) 70 - 99 mg/dL    Comment: Glucose reference range applies only to samples taken after fasting for at least 8 hours.  Glucose, capillary     Status: Abnormal   Collection Time: 10/30/21 11:46 AM  Result Value Ref Range   Glucose-Capillary 263 (H) 70 - 99 mg/dL    Comment:  Glucose reference range applies only to samples taken after fasting for at least 8 hours.  Glucose, capillary     Status: Abnormal   Collection Time: 10/30/21  3:40 PM  Result Value Ref Range   Glucose-Capillary 203 (H) 70 - 99 mg/dL    Comment: Glucose reference range applies only to samples taken after fasting for at least 8 hours.    Radiology PERIPHERAL VASCULAR CATHETERIZATION  Result Date: 10/26/2021  See surgical note for result.   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  To get a follow-up duplex next month in the office.  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  Has undergone left renal artery stent placement but developed worsening renal dysfunction after stent placement and is now on dialysis.  We discussed treating her right renal artery stenosis in hopes of improving her blood pressure control as well as potentially improving renal function.  Type II diabetes mellitus with renal manifestations (HCC) blood glucose control important in reducing the progression of atherosclerotic disease. Also, involved in wound healing. On appropriate medications.   Acute renal failure superimposed on stage 3a chronic kidney disease ([place]) Now on dialysis.  We discussed that treating her right renal artery stenosis has some chance of improving her renal dysfunction but there is certainly no guarantee.  Renal artery stenosis (HCC) Has undergone left renal artery stent placement but developed worsening renal dysfunction and is now on dialysis.  Has a known right renal artery stenosis.  We discussed that treating the right renal artery stenosis may improve her blood pressure and there is a chance it could get her off dialysis although that is difficult to discern.  I will discuss with her nephrologist if he thinks it is reasonable we can consider renal artery intervention in the next couple of weeks.  She would be very desirous of this if  there is any chance of getting her off dialysis.    [name], MD  11/24/2021 12:50 PM    This note was created with Dragon medical transcription system.  Any errors from dictation are purely unintentional

## 2021-11-24 NOTE — H&P (View-Only) (Signed)
MRN : [id number]  Dawn Ford is a 76 y.o. (Jul 10, 1945) female who presents with chief complaint of No chief complaint on file. Marland Kitchen  History of Present Illness: Patient returns today in follow up of her renal stenosis.  She came in with acute kidney injury and underwent a left renal stent placement a few weeks ago.  She developed worsening renal dysfunction and progressed to dialysis and she is still on dialysis at this time.  She had a known right renal artery stenosis as well but we do not treat both sides concomitantly.  She had some perinephric hematoma with her renal stent placement previously on the left.  She is very desirous of getting off of dialysis if at all possible.  She has recently had a 24-hour creatinine clearance checked by nephrology and goes back to see them in about a week and a half to get the results. 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  This is to be checked again next month with duplex.  Current Outpatient Medications  Medication Sig Dispense Refill   aspirin EC 81 MG tablet Take 81 mg by mouth daily.     atorvastatin (LIPITOR) 40 MG tablet Take 40 mg by mouth every evening.     carvedilol (COREG) 25 MG tablet Take 1 tablet (25 mg total) by mouth 2 (two) times daily with a meal. 60 tablet 2   cloNIDine (CATAPRES) 0.2 MG tablet Take 1 tablet (0.2 mg total) by mouth 2 (two) times daily. 60 tablet 11   clopidogrel (PLAVIX) 75 MG tablet Take 1 tablet (75 mg total) by mouth daily. 30 tablet 2   famotidine (PEPCID) 40 MG tablet Take 40 mg by mouth at bedtime.     fexofenadine (ALLEGRA) 180 MG tablet Take 180 mg by mouth daily as needed for allergies.     furosemide (LASIX) 40 MG tablet Take 1 tablet (40 mg total) by mouth daily. 30 tablet 2   glimepiride (AMARYL) 4 MG tablet Take 2 mg by mouth 2 (two) times daily.     hydrALAZINE (APRESOLINE) 100 MG tablet Take 1 tablet (100 mg total) by mouth  3 (three) times daily. 90 tablet 2   liraglutide (VICTOZA) 18 MG/3ML SOPN Inject 1.8 mg into the skin every evening.     NIFEdipine (ADALAT CC) 60 MG 24 hr tablet Take 1 tablet (60 mg total) by mouth daily. 30 tablet 2   pioglitazone (ACTOS) 15 MG tablet Take 7.5 mg by mouth daily.     No current facility-administered medications for this visit.    Past Medical History:  Diagnosis Date   Aneurysm of right common iliac artery ([place]) 01/19/2021   a.) US aorta 01/19/2021: measure 2.8 cm. b.) CTA abdomen/pelvis 02/13/2021: measured 2.9 x 2.7 cm.   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   a.) CTA abdomen/pelvis: measured 2.6 cm   GERD (gastroesophageal reflux disease)    Hyperlipemia    Hypertension    NSTEMI (non-ST elevated myocardial infarction) ([place]) 02/17/1999   a.) LHC 02/17/1999 --> 75% LAD and subtotal RCA with thrombus --> PCI performed placing stent (unknown type) to RCA   T2DM (type 2 diabetes mellitus) ([place])     Past Surgical History:  Procedure Laterality Date   CATARACT EXTRACTION, BILATERAL     COLONOSCOPY WITH PROPOFOL N/A 09/01/2018   Procedure: COLONOSCOPY WITH PROPOFOL;  Surgeon: Toledo, Benay Pike, MD;  Location: [facility];  Service: Gastroenterology;  Laterality: N/A;   CORONARY ANGIOPLASTY WITH STENT PLACEMENT     DIALYSIS/PERMA CATHETER INSERTION N/A 10/26/2021   Procedure: DIALYSIS/PERMA CATHETER INSERTION;  Surgeon: Algernon Huxley, MD;  Location: [facility];  Service: Cardiovascular;  Laterality: N/A;   ENDOVASCULAR REPAIR/STENT GRAFT N/A 03/01/2021   Procedure: ENDOVASCULAR REPAIR/STENT GRAFT;  Surgeon: Algernon Huxley, MD;  Location: [facility];  Service: Cardiovascular;  Laterality: N/A;   ESOPHAGOGASTRODUODENOSCOPY (EGD) WITH PROPOFOL N/A 03/13/2017    Procedure: ESOPHAGOGASTRODUODENOSCOPY (EGD) WITH PROPOFOL;  Surgeon: Virgel Manifold, MD;  Location: [facility];  Service: Endoscopy;  Laterality: N/A;   KNEE ARTHROSCOPY     RENAL ANGIOGRAPHY Bilateral 10/16/2021   Procedure: RENAL ANGIOGRAPHY;  Surgeon: Algernon Huxley, MD;  Location: [facility];  Service: Cardiovascular;  Laterality: Bilateral;     Social History   Tobacco Use   Smoking status: Former    Types: Cigarettes    Quit date: 2000    Years since quitting: 23.8   Smokeless tobacco: Never  Vaping Use   Vaping Use: Never used  Substance Use Topics   Alcohol use: Yes    Comment: rarely   Drug use: No       Family History  Problem Relation Age of Onset   Breast cancer Other   No bleeding disorders, clotting disorders, autoimmune diseases, or aneurysms  No Known Allergies   REVIEW OF SYSTEMS (Negative unless checked)  Constitutional: [] Weight loss  [] Fever  [] Chills Cardiac: [] Chest pain   [] Chest pressure   [] Palpitations   [] Shortness of breath when laying flat   [] Shortness of breath at rest   [] Shortness of breath with exertion. Vascular:  [] Pain in legs with walking   [] Pain in legs at rest   [] Pain in legs when laying flat   [] Claudication   [] Pain in feet when walking  [] Pain in feet at rest  [] Pain in feet when laying flat   [] History of DVT   [] Phlebitis   [x] Swelling in legs   [] Varicose veins   [] Non-healing ulcers Pulmonary:   [] Uses home oxygen   [] Productive cough   [] Hemoptysis   [] Wheeze  [] COPD   [] Asthma Neurologic:  [] Dizziness  [] Blackouts   [] Seizures   [] History of stroke   [] History of TIA  [] Aphasia   [] Temporary blindness   [] Dysphagia   [] Weakness or numbness in arms   [] Weakness or numbness in legs Musculoskeletal:  [x] Arthritis   [] Joint swelling   [] Joint pain   [] Low back pain Hematologic:  [] Easy bruising  [] Easy bleeding   [] Hypercoagulable state   [x] Anemic   Gastrointestinal:  [] Blood in stool   [] Vomiting blood   [] Gastroesophageal reflux/heartburn   [] Abdominal pain Genitourinary:  [x] Chronic kidney disease   [] Difficult urination  [] Frequent urination  [] Burning with urination   [] Hematuria Skin:  [] Rashes   [] Ulcers   [] Wounds Psychological:  [] History of anxiety   []  History of major depression.  Physical Examination  BP (!) 157/74 (BP Location: Left Arm)   Pulse 94   Resp 19   Ht 5\' 6"  (1.676 m)   Wt 170 lb 6.4 oz (77.3 kg)   BMI 27.50 kg/m  Gen:  WD/WN, NAD.  Appears far younger than stated age Head: [place]/AT, No temporalis wasting. Ear/Nose/Throat:  Hearing grossly intact, nares w/o erythema or drainage Eyes: Conjunctiva clear. Sclera non-icteric Neck: Supple.  Trachea midline Pulmonary:  Good air movement, no use of accessory muscles.  Cardiac: RRR, no JVD Vascular:  Vessel Right Left  Radial Palpable Palpable           Musculoskeletal: M/S 5/5 throughout.  No deformity or atrophy.  Mild lower extremity edema. Neurologic: Sensation grossly intact in extremities.  Symmetrical.  Speech is fluent.  Psychiatric: Judgment intact, Mood & affect appropriate for pt's clinical situation. Dermatologic: No rashes or ulcers noted.  No cellulitis or open wounds.      Labs Recent Results (from the past 2160 hour(s))  Basic metabolic panel     Status: Abnormal   Collection Time: 10/11/21 12:18 PM  Result Value Ref [place]   Sodium 136 135 - 145 mmol/L   Potassium 4.8 3.5 - 5.1 mmol/L   Chloride 107 98 - 111 mmol/L   CO2 21 (L) 22 - 32 mmol/L   Glucose, Bld 218 (H) 70 - 99 mg/dL    Comment: Glucose reference range applies only to samples taken after fasting for at least 8 hours.   BUN 37 (H) 8 - 23 mg/dL   Creatinine, Ser 2.62 (H) 0.44 - 1.00 mg/dL   Calcium 9.0 8.9 - 10.3 mg/dL   GFR, Estimated 18 (L) >60 mL/min    Comment: (NOTE) Calculated using the CKD-EPI Creatinine Equation (2021)    Anion gap 8 5 - 15    Comment: Performed at [facility] [address]  [place], [place] [address]  CBC     Status: Abnormal   Collection Time: 10/11/21 12:18 PM  Result Value Ref [place]   WBC 7.7 4.0 - 10.5 K/uL   RBC 4.17 3.87 - 5.11 MIL/uL   Hemoglobin 11.7 (L) 12.0 - 15.0 g/dL   HCT 36.6 36.0 - 46.0 %   MCV 87.8 80.0 - 100.0 fL   MCH 28.1 26.0 - 34.0 pg   MCHC 32.0 30.0 - 36.0 g/dL   RDW 13.7 11.5 - 15.5 %   Platelets 222 150 - 400 K/uL   nRBC 0.0 0.0 - 0.2 %    Comment: Performed at [facility], [place], [place] [address]  Troponin I (High Sensitivity)     Status: None   Collection Time: 10/11/21 12:18 PM  Result Value Ref [place]   Troponin I (High Sensitivity) 8 <18 ng/L    Comment: (NOTE) Elevated high sensitivity troponin I (hsTnI) values and significant  changes across serial measurements may suggest ACS but many other  chronic and acute conditions are known to elevate hsTnI results.  Refer to the "Links" section for chest pain algorithms and additional  guidance. Performed at [facility], [address] [place], [place] [address]   Hemoglobin A1c     Status: Abnormal   Collection Time: 10/11/21 12:18 PM  Result Value Ref [place]   Hgb A1c MFr Bld 5.7 (H) 4.8 - 5.6 %    Comment: (NOTE) Pre diabetes:          5.7%-6.4%  Diabetes:              >6.4%  Glycemic control for   <7.0% adults with diabetes    Mean Plasma Glucose 116.89 mg/dL    Comment: Performed at [facility] [address] [place], [place] [address]  Troponin I (High Sensitivity)     Status: None   Collection Time:  10/11/21  4:04 PM  Result Value Ref [place]   Troponin I (High Sensitivity) 8 <18 ng/L    Comment: (NOTE) Elevated high sensitivity troponin I (hsTnI) values and significant  changes across serial measurements may suggest ACS but many other  chronic and acute conditions are known to elevate hsTnI results.  Refer to the "Links" section for chest pain algorithms and additional  guidance. Performed at [facility], [address] [place], [place] [address]   CBG monitoring, ED     Status: Abnormal   Collection Time: 10/11/21 10:11 PM  Result Value Ref Range   Glucose-Capillary 173 (H) 70 - 99 mg/dL    Comment: Glucose reference range applies only to samples taken after fasting for at least 8 hours.  Lipid panel     Status: Abnormal   Collection Time: 10/12/21  5:49 AM  Result Value Ref [place]   Cholesterol 105 0 - 200 mg/dL   Triglycerides 57 <150 mg/dL   HDL 33 (L) >40 mg/dL   Total CHOL/HDL Ratio 3.2 RATIO   VLDL 11 0 - 40 mg/dL   LDL Cholesterol 61 0 - 99 mg/dL    Comment:        Total Cholesterol/HDL:CHD Risk Coronary Heart Disease Risk Table                     Men   Women  1/2 Average Risk   3.4   3.3  Average Risk       5.0   4.4  2 X Average Risk   9.6   7.1  3 X Average Risk  23.4   11.0        Use the calculated Patient Ratio above and the CHD Risk Table to determine the patient's CHD Risk.        ATP III CLASSIFICATION (LDL):  <100     mg/dL   Optimal  100-129  mg/dL   Near or Above                    Optimal  130-159  mg/dL   Borderline  160-189  mg/dL   High  >190     mg/dL   Very High Performed at [place] [place], [place] [address]   Basic metabolic panel     Status: Abnormal   Collection Time: 10/12/21  5:49 AM  Result Value Ref [place]   Sodium 140 135 - 145 mmol/L   Potassium 4.2 3.5 - 5.1 mmol/L   Chloride 109 98 - 111 mmol/L   CO2 24 22 - 32 mmol/L   Glucose, Bld 85 70 - 99 mg/dL    Comment: Glucose reference range applies only to samples taken after fasting for at least 8 hours.   BUN 40 (H) 8 - 23 mg/dL   Creatinine, Ser 2.34 (H) 0.44 - 1.00 mg/dL   Calcium 8.5 (L) 8.9 - 10.3 mg/dL   GFR, Estimated 21 (L) >60 mL/min    Comment: (NOTE) Calculated using the CKD-EPI Creatinine Equation (2021)    Anion gap 7 5 - 15    Comment: Performed at [facility], [address] [place], [place] [address]  CBG  monitoring, ED     Status: None   Collection Time: 10/12/21  7:49 AM  Result Value Ref Range   Glucose-Capillary 90 70 - 99 mg/dL    Comment: Glucose reference range applies only to samples taken after fasting  for at least 8 hours.  CBG monitoring, ED     Status: Abnormal   Collection Time: 10/12/21 11:43 AM  Result Value Ref Range   Glucose-Capillary 220 (H) 70 - 99 mg/dL    Comment: Glucose reference range applies only to samples taken after fasting for at least 8 hours.  Protime-INR     Status: None   Collection Time: 10/12/21  2:08 PM  Result Value Ref [place]   Prothrombin Time 13.9 11.4 - 15.2 seconds   INR 1.1 0.8 - 1.2    Comment: (NOTE) INR goal varies based on device and disease states. Performed at [facility], [address] [place], [place] [address]   Glucose, capillary     Status: None   Collection Time: 10/12/21  5:16 PM  Result Value Ref Range   Glucose-Capillary 86 70 - 99 mg/dL    Comment: Glucose reference range applies only to samples taken after fasting for at least 8 hours.  Glucose, capillary     Status: Abnormal   Collection Time: 10/12/21  8:40 PM  Result Value Ref Range   Glucose-Capillary 197 (H) 70 - 99 mg/dL    Comment: Glucose reference range applies only to samples taken after fasting for at least 8 hours.   Comment 1 Notify RN   Basic metabolic panel     Status: Abnormal   Collection Time: 10/13/21  5:01 AM  Result Value Ref [place]   Sodium 138 135 - 145 mmol/L   Potassium 4.3 3.5 - 5.1 mmol/L   Chloride 109 98 - 111 mmol/L   CO2 22 22 - 32 mmol/L   Glucose, Bld 117 (H) 70 - 99 mg/dL    Comment: Glucose reference range applies only to samples taken after fasting for at least 8 hours.   BUN 40 (H) 8 - 23 mg/dL   Creatinine, Ser 2.47 (H) 0.44 - 1.00 mg/dL   Calcium 8.7 (L) 8.9 - 10.3 mg/dL   GFR, Estimated 19 (L) >60 mL/min    Comment: (NOTE) Calculated using the CKD-EPI Creatinine Equation (2021)    Anion gap 7 5 - 15     Comment: Performed at [facility], [address] [place], [place] [address]  Glucose, capillary     Status: Abnormal   Collection Time: 10/13/21  8:20 AM  Result Value Ref Range   Glucose-Capillary 157 (H) 70 - 99 mg/dL    Comment: Glucose reference range applies only to samples taken after fasting for at least 8 hours.  Glucose, capillary     Status: Abnormal   Collection Time: 10/13/21 11:41 AM  Result Value Ref Range   Glucose-Capillary 242 (H) 70 - 99 mg/dL    Comment: Glucose reference range applies only to samples taken after fasting for at least 8 hours.  Glucose, capillary     Status: Abnormal   Collection Time: 10/13/21  4:51 PM  Result Value Ref Range   Glucose-Capillary 219 (H) 70 - 99 mg/dL    Comment: Glucose reference range applies only to samples taken after fasting for at least 8 hours.  Glucose, capillary     Status: Abnormal   Collection Time: 10/13/21  8:09 PM  Result Value Ref [place]   Glucose-Capillary 203 (H) 70 - 99 mg/dL    Comment: Glucose reference range applies only to samples taken after fasting for at least 8 hours.  Glucose, capillary     Status: None   Collection Time: 10/14/21 12:49 AM  Result  Value Ref Range   Glucose-Capillary 92 70 - 99 mg/dL    Comment: Glucose reference range applies only to samples taken after fasting for at least 8 hours.  Glucose, capillary     Status: Abnormal   Collection Time: 10/14/21  5:17 AM  Result Value Ref [place]   Glucose-Capillary 104 (H) 70 - 99 mg/dL    Comment: Glucose reference range applies only to samples taken after fasting for at least 8 hours.   Comment 1 Notify RN   Glucose, capillary     Status: Abnormal   Collection Time: 10/14/21  7:45 AM  Result Value Ref Range   Glucose-Capillary 142 (H) 70 - 99 mg/dL    Comment: Glucose reference range applies only to samples taken after fasting for at least 8 hours.  Basic metabolic panel     Status: Abnormal   Collection Time: 10/14/21  9:00 AM   Result Value Ref Range   Sodium 137 135 - 145 mmol/L   Potassium 4.2 3.5 - 5.1 mmol/L   Chloride 111 98 - 111 mmol/L   CO2 22 22 - 32 mmol/L   Glucose, Bld 148 (H) 70 - 99 mg/dL    Comment: Glucose reference range applies only to samples taken after fasting for at least 8 hours.   BUN 37 (H) 8 - 23 mg/dL   Creatinine, Ser 2.39 (H) 0.44 - 1.00 mg/dL   Calcium 8.8 (L) 8.9 - 10.3 mg/dL   GFR, Estimated 20 (L) >60 mL/min    Comment: (NOTE) Calculated using the CKD-EPI Creatinine Equation (2021)    Anion gap 4 (L) 5 - 15    Comment: Performed at [facility], [address] [place], [place] [address]  Glucose, capillary     Status: Abnormal   Collection Time: 10/14/21 12:10 PM  Result Value Ref Range   Glucose-Capillary 278 (H) 70 - 99 mg/dL    Comment: Glucose reference range applies only to samples taken after fasting for at least 8 hours.  Glucose, capillary     Status: Abnormal   Collection Time: 10/14/21  4:17 PM  Result Value Ref Range   Glucose-Capillary 181 (H) 70 - 99 mg/dL    Comment: Glucose reference range applies only to samples taken after fasting for at least 8 hours.  Glucose, capillary     Status: Abnormal   Collection Time: 10/14/21  8:51 PM  Result Value Ref [place]   Glucose-Capillary 190 (H) 70 - 99 mg/dL    Comment: Glucose reference range applies only to samples taken after fasting for at least 8 hours.   Comment 1 Notify RN   Glucose, capillary     Status: Abnormal   Collection Time: 10/15/21  8:27 AM  Result Value Ref Range   Glucose-Capillary 128 (H) 70 - 99 mg/dL    Comment: Glucose reference range applies only to samples taken after fasting for at least 8 hours.  Basic metabolic panel     Status: Abnormal   Collection Time: 10/15/21 10:14 AM  Result Value Ref Range   Sodium 137 135 - 145 mmol/L   Potassium 4.4 3.5 - 5.1 mmol/L   Chloride 109 98 - 111 mmol/L   CO2 20 (L) 22 - 32 mmol/L   Glucose, Bld 279 (H) 70 - 99 mg/dL    Comment:  Glucose reference range applies only to samples taken after fasting for at least 8 hours.   BUN 36 (H) 8 - 23 mg/dL   Creatinine, Ser  2.32 (H) 0.44 - 1.00 mg/dL   Calcium 9.0 8.9 - 10.3 mg/dL   GFR, Estimated 21 (L) >60 mL/min    Comment: (NOTE) Calculated using the CKD-EPI Creatinine Equation (2021)    Anion gap 8 5 - 15    Comment: Performed at [facility], [address] [place], [place] [address]  Glucose, capillary     Status: Abnormal   Collection Time: 10/15/21  1:21 PM  Result Value Ref Range   Glucose-Capillary 236 (H) 70 - 99 mg/dL    Comment: Glucose reference range applies only to samples taken after fasting for at least 8 hours.  Glucose, capillary     Status: Abnormal   Collection Time: 10/15/21  5:33 PM  Result Value Ref Range   Glucose-Capillary 160 (H) 70 - 99 mg/dL    Comment: Glucose reference range applies only to samples taken after fasting for at least 8 hours.  Glucose, capillary     Status: Abnormal   Collection Time: 10/15/21  8:28 PM  Result Value Ref Range   Glucose-Capillary 212 (H) 70 - 99 mg/dL    Comment: Glucose reference range applies only to samples taken after fasting for at least 8 hours.  Glucose, capillary     Status: Abnormal   Collection Time: 10/15/21  9:24 PM  Result Value Ref Range   Glucose-Capillary 210 (H) 70 - 99 mg/dL    Comment: Glucose reference range applies only to samples taken after fasting for at least 8 hours.  Basic metabolic panel     Status: Abnormal   Collection Time: 10/16/21  4:34 AM  Result Value Ref Range   Sodium 135 135 - 145 mmol/L   Potassium 4.6 3.5 - 5.1 mmol/L   Chloride 107 98 - 111 mmol/L   CO2 21 (L) 22 - 32 mmol/L   Glucose, Bld 145 (H) 70 - 99 mg/dL    Comment: Glucose reference range applies only to samples taken after fasting for at least 8 hours.   BUN 38 (H) 8 - 23 mg/dL   Creatinine, Ser 2.19 (H) 0.44 - 1.00 mg/dL   Calcium 8.9 8.9 - 10.3 mg/dL   GFR, Estimated 23 (L) >60 mL/min     Comment: (NOTE) Calculated using the CKD-EPI Creatinine Equation (2021)    Anion gap 7 5 - 15    Comment: Performed at [facility], [address] [place], [place] [address]  Glucose, capillary     Status: Abnormal   Collection Time: 10/16/21  7:54 AM  Result Value Ref Range   Glucose-Capillary 150 (H) 70 - 99 mg/dL    Comment: Glucose reference range applies only to samples taken after fasting for at least 8 hours.  Glucose, capillary     Status: Abnormal   Collection Time: 10/16/21 11:28 AM  Result Value Ref Range   Glucose-Capillary 138 (H) 70 - 99 mg/dL    Comment: Glucose reference range applies only to samples taken after fasting for at least 8 hours.  BUN     Status: Abnormal   Collection Time: 10/16/21  2:39 PM  Result Value Ref Range   BUN 36 (H) 8 - 23 mg/dL    Comment: Performed at [facility], [address] [place], [place] [address]  Creatinine, serum     Status: Abnormal   Collection Time: 10/16/21  2:39 PM  Result Value Ref Range   Creatinine, Ser 2.28 (H) 0.44 - 1.00 mg/dL   GFR, Estimated 21 (L) >  60 mL/min    Comment: (NOTE) Calculated using the CKD-EPI Creatinine Equation (2021) Performed at [facility], [address] [place], [place] [address]   Glucose, capillary     Status: Abnormal   Collection Time: 10/16/21  4:28 PM  Result Value Ref Range   Glucose-Capillary 295 (H) 70 - 99 mg/dL    Comment: Glucose reference range applies only to samples taken after fasting for at least 8 hours.  Glucose, capillary     Status: Abnormal   Collection Time: 10/16/21 10:06 PM  Result Value Ref Range   Glucose-Capillary 128 (H) 70 - 99 mg/dL    Comment: Glucose reference range applies only to samples taken after fasting for at least 8 hours.  Basic metabolic panel     Status: Abnormal   Collection Time: 10/17/21  3:29 AM  Result Value Ref Range   Sodium 134 (L) 135 - 145 mmol/L   Potassium 5.1 3.5 - 5.1 mmol/L   Chloride 104 98 -  111 mmol/L   CO2 18 (L) 22 - 32 mmol/L   Glucose, Bld 170 (H) 70 - 99 mg/dL    Comment: Glucose reference range applies only to samples taken after fasting for at least 8 hours.   BUN 40 (H) 8 - 23 mg/dL   Creatinine, Ser 2.98 (H) 0.44 - 1.00 mg/dL   Calcium 8.8 (L) 8.9 - 10.3 mg/dL   GFR, Estimated 16 (L) >60 mL/min    Comment: (NOTE) Calculated using the CKD-EPI Creatinine Equation (2021)    Anion gap 12 5 - 15    Comment: Performed at [facility], [address] [place], [place] [address]  CBC     Status: Abnormal   Collection Time: 10/17/21  3:29 AM  Result Value Ref Range   WBC 13.0 (H) 4.0 - 10.5 K/uL   RBC 3.50 (L) 3.87 - 5.11 MIL/uL   Hemoglobin 9.8 (L) 12.0 - 15.0 g/dL   HCT 29.7 (L) 36.0 - 46.0 %   MCV 84.9 80.0 - 100.0 fL   MCH 28.0 26.0 - 34.0 pg   MCHC 33.0 30.0 - 36.0 g/dL   RDW 13.7 11.5 - 15.5 %   Platelets 175 150 - 400 K/uL   nRBC 0.0 0.0 - 0.2 %    Comment: Performed at [facility], [address] [place], [place] [address]  Glucose, capillary     Status: Abnormal   Collection Time: 10/17/21  7:33 AM  Result Value Ref Range   Glucose-Capillary 158 (H) 70 - 99 mg/dL    Comment: Glucose reference range applies only to samples taken after fasting for at least 8 hours.  Glucose, capillary     Status: Abnormal   Collection Time: 10/17/21 12:11 PM  Result Value Ref Range   Glucose-Capillary 152 (H) 70 - 99 mg/dL    Comment: Glucose reference range applies only to samples taken after fasting for at least 8 hours.  Glucose, capillary     Status: Abnormal   Collection Time: 10/17/21  3:30 PM  Result Value Ref Range   Glucose-Capillary 260 (H) 70 - 99 mg/dL    Comment: Glucose reference range applies only to samples taken after fasting for at least 8 hours.  Glucose, capillary     Status: Abnormal   Collection Time: 10/18/21 12:27 AM  Result Value Ref Range   Glucose-Capillary 220 (H) 70 - 99 mg/dL    Comment: Glucose reference range  applies only to samples taken after  fasting for at least 8 hours.  Glucose, capillary     Status: Abnormal   Collection Time: 10/18/21  8:22 AM  Result Value Ref Range   Glucose-Capillary 145 (H) 70 - 99 mg/dL    Comment: Glucose reference range applies only to samples taken after fasting for at least 8 hours.  Basic metabolic panel     Status: Abnormal   Collection Time: 10/18/21  9:00 AM  Result Value Ref Range   Sodium 131 (L) 135 - 145 mmol/L   Potassium 4.4 3.5 - 5.1 mmol/L   Chloride 105 98 - 111 mmol/L   CO2 18 (L) 22 - 32 mmol/L   Glucose, Bld 144 (H) 70 - 99 mg/dL    Comment: Glucose reference range applies only to samples taken after fasting for at least 8 hours.   BUN 54 (H) 8 - 23 mg/dL   Creatinine, Ser 4.28 (H) 0.44 - 1.00 mg/dL   Calcium 8.6 (L) 8.9 - 10.3 mg/dL   GFR, Estimated 10 (L) >60 mL/min    Comment: (NOTE) Calculated using the CKD-EPI Creatinine Equation (2021)    Anion gap 8 5 - 15    Comment: Performed at [facility], [address] [place], [place] [address]  CBC     Status: Abnormal   Collection Time: 10/18/21  9:00 AM  Result Value Ref Range   WBC 9.3 4.0 - 10.5 K/uL   RBC 3.04 (L) 3.87 - 5.11 MIL/uL   Hemoglobin 8.8 (L) 12.0 - 15.0 g/dL   HCT 25.8 (L) 36.0 - 46.0 %   MCV 84.9 80.0 - 100.0 fL   MCH 28.9 26.0 - 34.0 pg   MCHC 34.1 30.0 - 36.0 g/dL   RDW 13.3 11.5 - 15.5 %   Platelets 155 150 - 400 K/uL   nRBC 0.0 0.0 - 0.2 %    Comment: Performed at [facility], [address], [place], [place] [address]  Magnesium     Status: None   Collection Time: 10/18/21  9:00 AM  Result Value Ref Range   Magnesium 2.0 1.7 - 2.4 mg/dL    Comment: Performed at [facility], [address] [place], [place] [address]  Glucose, capillary     Status: Abnormal   Collection Time: 10/18/21 11:41 AM  Result Value Ref Range   Glucose-Capillary 238 (H) 70 - 99 mg/dL    Comment: Glucose reference range applies only to samples taken  after fasting for at least 8 hours.  Glucose, capillary     Status: Abnormal   Collection Time: 10/18/21  4:36 PM  Result Value Ref Range   Glucose-Capillary 204 (H) 70 - 99 mg/dL    Comment: Glucose reference range applies only to samples taken after fasting for at least 8 hours.  Glucose, capillary     Status: Abnormal   Collection Time: 10/18/21  9:14 PM  Result Value Ref Range   Glucose-Capillary 174 (H) 70 - 99 mg/dL    Comment: Glucose reference range applies only to samples taken after fasting for at least 8 hours.  Basic metabolic panel     Status: Abnormal   Collection Time: 10/19/21  4:52 AM  Result Value Ref Range   Sodium 132 (L) 135 - 145 mmol/L   Potassium 4.8 3.5 - 5.1 mmol/L   Chloride 106 98 - 111 mmol/L   CO2 16 (L) 22 - 32 mmol/L   Glucose, Bld 166 (H) 70 - 99 mg/dL  Comment: Glucose reference range applies only to samples taken after fasting for at least 8 hours.   BUN 52 (H) 8 - 23 mg/dL   Creatinine, Ser 4.21 (H) 0.44 - 1.00 mg/dL   Calcium 8.5 (L) 8.9 - 10.3 mg/dL   GFR, Estimated 10 (L) >60 mL/min    Comment: (NOTE) Calculated using the CKD-EPI Creatinine Equation (2021)    Anion gap 10 5 - 15    Comment: Performed at [facility], [address] [place], [place] [address]  CBC     Status: Abnormal   Collection Time: 10/19/21  4:52 AM  Result Value Ref Range   WBC 9.3 4.0 - 10.5 K/uL   RBC 3.04 (L) 3.87 - 5.11 MIL/uL   Hemoglobin 8.6 (L) 12.0 - 15.0 g/dL   HCT 26.0 (L) 36.0 - 46.0 %   MCV 85.5 80.0 - 100.0 fL   MCH 28.3 26.0 - 34.0 pg   MCHC 33.1 30.0 - 36.0 g/dL   RDW 13.2 11.5 - 15.5 %   Platelets 173 150 - 400 K/uL   nRBC 0.0 0.0 - 0.2 %    Comment: Performed at [facility], [address], [place], [place] [address]  Magnesium     Status: None   Collection Time: 10/19/21  4:52 AM  Result Value Ref Range   Magnesium 2.0 1.7 - 2.4 mg/dL    Comment: Performed at [facility], [address] [place], [place]  [address]  Glucose, capillary     Status: Abnormal   Collection Time: 10/19/21  8:03 AM  Result Value Ref Range   Glucose-Capillary 169 (H) 70 - 99 mg/dL    Comment: Glucose reference range applies only to samples taken after fasting for at least 8 hours.  Glucose, capillary     Status: Abnormal   Collection Time: 10/19/21 11:40 AM  Result Value Ref Range   Glucose-Capillary 199 (H) 70 - 99 mg/dL    Comment: Glucose reference range applies only to samples taken after fasting for at least 8 hours.  Glucose, capillary     Status: Abnormal   Collection Time: 10/19/21  4:39 PM  Result Value Ref Range   Glucose-Capillary 174 (H) 70 - 99 mg/dL    Comment: Glucose reference range applies only to samples taken after fasting for at least 8 hours.  Glucose, capillary     Status: Abnormal   Collection Time: 10/19/21  9:33 PM  Result Value Ref Range   Glucose-Capillary 202 (H) 70 - 99 mg/dL    Comment: Glucose reference range applies only to samples taken after fasting for at least 8 hours.  Basic metabolic panel     Status: Abnormal   Collection Time: 10/20/21  5:40 AM  Result Value Ref Range   Sodium 133 (L) 135 - 145 mmol/L   Potassium 4.6 3.5 - 5.1 mmol/L   Chloride 104 98 - 111 mmol/L   CO2 18 (L) 22 - 32 mmol/L   Glucose, Bld 156 (H) 70 - 99 mg/dL    Comment: Glucose reference range applies only to samples taken after fasting for at least 8 hours.   BUN 61 (H) 8 - 23 mg/dL   Creatinine, Ser 4.93 (H) 0.44 - 1.00 mg/dL   Calcium 8.8 (L) 8.9 - 10.3 mg/dL   GFR, Estimated 9 (L) >60 mL/min    Comment: (NOTE) Calculated using the CKD-EPI Creatinine Equation (2021)    Anion gap 11 5 - 15  Comment: Performed at [facility], [address] [place], [place] [address]  CBC     Status: Abnormal   Collection Time: 10/20/21  5:40 AM  Result Value Ref Range   WBC 8.6 4.0 - 10.5 K/uL   RBC 3.15 (L) 3.87 - 5.11 MIL/uL   Hemoglobin 9.0 (L) 12.0 - 15.0 g/dL   HCT 26.7 (L) 36.0 - 46.0 %    MCV 84.8 80.0 - 100.0 fL   MCH 28.6 26.0 - 34.0 pg   MCHC 33.7 30.0 - 36.0 g/dL   RDW 13.2 11.5 - 15.5 %   Platelets 190 150 - 400 K/uL   nRBC 0.0 0.0 - 0.2 %    Comment: Performed at [facility], [address], [place], [place] [address]  Magnesium     Status: None   Collection Time: 10/20/21  5:40 AM  Result Value Ref Range   Magnesium 2.2 1.7 - 2.4 mg/dL    Comment: Performed at [facility], [address] [place], [place] [address]  Glucose, capillary     Status: Abnormal   Collection Time: 10/20/21  7:46 AM  Result Value Ref Range   Glucose-Capillary 160 (H) 70 - 99 mg/dL    Comment: Glucose reference range applies only to samples taken after fasting for at least 8 hours.  Glucose, capillary     Status: Abnormal   Collection Time: 10/20/21 12:20 PM  Result Value Ref Range   Glucose-Capillary 217 (H) 70 - 99 mg/dL    Comment: Glucose reference range applies only to samples taken after fasting for at least 8 hours.  Glucose, capillary     Status: Abnormal   Collection Time: 10/20/21  5:12 PM  Result Value Ref Range   Glucose-Capillary 241 (H) 70 - 99 mg/dL    Comment: Glucose reference range applies only to samples taken after fasting for at least 8 hours.  Glucose, capillary     Status: Abnormal   Collection Time: 10/20/21  9:07 PM  Result Value Ref Range   Glucose-Capillary 238 (H) 70 - 99 mg/dL    Comment: Glucose reference range applies only to samples taken after fasting for at least 8 hours.  Glucose, capillary     Status: Abnormal   Collection Time: 10/20/21 11:41 PM  Result Value Ref Range   Glucose-Capillary 198 (H) 70 - 99 mg/dL    Comment: Glucose reference range applies only to samples taken after fasting for at least 8 hours.  Basic metabolic panel     Status: Abnormal   Collection Time: 10/21/21  5:26 AM  Result Value Ref Range   Sodium 128 (L) 135 - 145 mmol/L   Potassium 4.6 3.5 - 5.1 mmol/L   Chloride 100 98 - 111 mmol/L   CO2  17 (L) 22 - 32 mmol/L   Glucose, Bld 169 (H) 70 - 99 mg/dL    Comment: Glucose reference range applies only to samples taken after fasting for at least 8 hours.   BUN 70 (H) 8 - 23 mg/dL   Creatinine, Ser 5.37 (H) 0.44 - 1.00 mg/dL   Calcium 8.4 (L) 8.9 - 10.3 mg/dL   GFR, Estimated 8 (L) >60 mL/min    Comment: (NOTE) Calculated using the CKD-EPI Creatinine Equation (2021)    Anion gap 11 5 - 15    Comment: Performed at [facility], [address] [place], [place] [address]  CBC     Status: Abnormal   Collection Time: 10/21/21  5:26 AM  Result Value Ref Range   WBC 8.2 4.0 - 10.5 K/uL   RBC 2.97 (L) 3.87 - 5.11 MIL/uL   Hemoglobin 8.4 (L) 12.0 - 15.0 g/dL   HCT 24.5 (L) 36.0 - 46.0 %   MCV 82.5 80.0 - 100.0 fL   MCH 28.3 26.0 - 34.0 pg   MCHC 34.3 30.0 - 36.0 g/dL   RDW 12.6 11.5 - 15.5 %   Platelets 198 150 - 400 K/uL   nRBC 0.0 0.0 - 0.2 %    Comment: Performed at [facility], [address], [place], [place] [address]  Magnesium     Status: None   Collection Time: 10/21/21  5:26 AM  Result Value Ref Range   Magnesium 2.2 1.7 - 2.4 mg/dL    Comment: Performed at [facility], [address] [place], [place] [address]  Glucose, capillary     Status: Abnormal   Collection Time: 10/21/21  8:13 AM  Result Value Ref Range   Glucose-Capillary 169 (H) 70 - 99 mg/dL    Comment: Glucose reference range applies only to samples taken after fasting for at least 8 hours.  Glucose, capillary     Status: Abnormal   Collection Time: 10/21/21 11:20 AM  Result Value Ref Range   Glucose-Capillary 261 (H) 70 - 99 mg/dL    Comment: Glucose reference range applies only to samples taken after fasting for at least 8 hours.  Hepatitis B surface antigen     Status: None   Collection Time: 10/21/21  2:54 PM  Result Value Ref Range   Hepatitis B Surface Ag NON REACTIVE NON REACTIVE    Comment: Performed at [facility], [address], [place], [place]  [address]  Hepatitis B surface antibody     Status: Abnormal   Collection Time: 10/21/21  2:54 PM  Result Value Ref Range   Hep B S Ab Reactive (A) NON REACTIVE    Comment: (NOTE) Consistent with immunity, greater than 9.9 mIU/mL.  Performed at [facility], [address] [place], [place] [address]   Hepatitis B surface antibody,quantitative     Status: None   Collection Time: 10/21/21  2:54 PM  Result Value Ref Range   Hep B S AB Quant (Post) 143.7 Immunity>9.9 mIU/mL    Comment: (NOTE)  Status of Immunity                     Anti-HBs Level  ------------------                     -------------- Inconsistent with Immunity                   0.0 - 9.9 Consistent with Immunity                          >9.9 Performed At: [facility] [address] [place], [place] [id number] Rush Farmer MD MV:[id number]   Hepatitis B core antibody, total     Status: Abnormal   Collection Time: 10/21/21  2:54 PM  Result Value Ref Range   Hep B Core Total Ab Reactive (A) NON REACTIVE    Comment: Performed at [facility], [address], [place], [place] [address]  Hepatitis C antibody     Status: None   Collection Time: 10/21/21  2:54 PM  Result Value Ref Range   HCV  Ab NON REACTIVE NON REACTIVE    Comment: (NOTE) Nonreactive HCV antibody screen is consistent with no HCV infections,  unless recent infection is suspected or other evidence exists to indicate HCV infection.  Performed at [facility], [place] [address] [place], [place] [address]   Glucose, capillary     Status: Abnormal   Collection Time: 10/21/21  4:58 PM  Result Value Ref Range   Glucose-Capillary 229 (H) 70 - 99 mg/dL    Comment: Glucose reference range applies only to samples taken after fasting for at least 8 hours.  Renal function panel     Status: Abnormal   Collection Time: 10/21/21  7:00 PM  Result Value Ref Range   Sodium 131 (L) 135 - 145 mmol/L   Potassium 3.8 3.5 - 5.1 mmol/L    Chloride 96 (L) 98 - 111 mmol/L   CO2 24 22 - 32 mmol/L   Glucose, Bld 129 (H) 70 - 99 mg/dL    Comment: Glucose reference range applies only to samples taken after fasting for at least 8 hours.   BUN 46 (H) 8 - 23 mg/dL   Creatinine, Ser 3.38 (H) 0.44 - 1.00 mg/dL   Calcium 8.2 (L) 8.9 - 10.3 mg/dL   Phosphorus 2.7 2.5 - 4.6 mg/dL   Albumin 3.1 (L) 3.5 - 5.0 g/dL   GFR, Estimated 13 (L) >60 mL/min    Comment: (NOTE) Calculated using the CKD-EPI Creatinine Equation (2021)    Anion gap 11 5 - 15    Comment: Performed at [facility], [address] [place], [place] [address]  CBC     Status: Abnormal   Collection Time: 10/21/21  7:00 PM  Result Value Ref Range   WBC 12.0 (H) 4.0 - 10.5 K/uL   RBC 2.99 (L) 3.87 - 5.11 MIL/uL   Hemoglobin 8.4 (L) 12.0 - 15.0 g/dL   HCT 24.4 (L) 36.0 - 46.0 %   MCV 81.6 80.0 - 100.0 fL   MCH 28.1 26.0 - 34.0 pg   MCHC 34.4 30.0 - 36.0 g/dL   RDW 12.6 11.5 - 15.5 %   Platelets 181 150 - 400 K/uL   nRBC 0.0 0.0 - 0.2 %    Comment: Performed at [facility], [address] [place], [place] [address]  Glucose, capillary     Status: Abnormal   Collection Time: 10/21/21  9:54 PM  Result Value Ref Range   Glucose-Capillary 139 (H) 70 - 99 mg/dL    Comment: Glucose reference range applies only to samples taken after fasting for at least 8 hours.  Basic metabolic panel     Status: Abnormal   Collection Time: 10/22/21  6:42 AM  Result Value Ref Range   Sodium 128 (L) 135 - 145 mmol/L   Potassium 4.2 3.5 - 5.1 mmol/L   Chloride 96 (L) 98 - 111 mmol/L   CO2 22 22 - 32 mmol/L   Glucose, Bld 184 (H) 70 - 99 mg/dL    Comment: Glucose reference range applies only to samples taken after fasting for at least 8 hours.   BUN 51 (H) 8 - 23 mg/dL   Creatinine, Ser 4.71 (H) 0.44 - 1.00 mg/dL   Calcium 8.3 (L) 8.9 - 10.3 mg/dL   GFR, Estimated 9 (L) >60 mL/min    Comment: (NOTE) Calculated using the CKD-EPI Creatinine Equation (2021)     Anion gap 10 5 - 15    Comment: Performed at [facility],  [place], [place] [address]  CBC     Status: Abnormal   Collection Time: 10/22/21  6:42 AM  Result Value Ref Range   WBC 9.2 4.0 - 10.5 K/uL   RBC 3.13 (L) 3.87 - 5.11 MIL/uL   Hemoglobin 8.8 (L) 12.0 - 15.0 g/dL   HCT 25.7 (L) 36.0 - 46.0 %   MCV 82.1 80.0 - 100.0 fL   MCH 28.1 26.0 - 34.0 pg   MCHC 34.2 30.0 - 36.0 g/dL   RDW 12.7 11.5 - 15.5 %   Platelets 214 150 - 400 K/uL   nRBC 0.0 0.0 - 0.2 %    Comment: Performed at [facility], [address] [place], [place] [address]  Magnesium     Status: None   Collection Time: 10/22/21  6:42 AM  Result Value Ref Range   Magnesium 2.0 1.7 - 2.4 mg/dL    Comment: Performed at [facility], [address] [place], [place] [address]  Glucose, capillary     Status: Abnormal   Collection Time: 10/22/21  8:36 AM  Result Value Ref Range   Glucose-Capillary 185 (H) 70 - 99 mg/dL    Comment: Glucose reference range applies only to samples taken after fasting for at least 8 hours.  Vitamin B12     Status: None   Collection Time: 10/22/21 12:31 PM  Result Value Ref Range   Vitamin B-12 369 180 - 914 pg/mL    Comment: (NOTE) This assay is not validated for testing neonatal or myeloproliferative syndrome specimens for Vitamin B12 levels. Performed at [facility], [address] [place], [place] [address]   Folate     Status: None   Collection Time: 10/22/21 12:31 PM  Result Value Ref Range   Folate 13.7 >5.9 ng/mL    Comment: Performed at [facility], [address] [place], [place] [address]  Iron and TIBC     Status: None   Collection Time: 10/22/21 12:31 PM  Result Value Ref Range   Iron 29 28 - 170 ug/dL   TIBC 252 250 - 450 ug/dL   Saturation Ratios 12 10.4 - 31.8 %   UIBC 223 ug/dL    Comment: Performed at [facility], [address] [place], [place] [address]  Ferritin     Status: None    Collection Time: 10/22/21 12:31 PM  Result Value Ref Range   Ferritin 200 11 - 307 ng/mL    Comment: Performed at [facility], [address] [place],  [address]  Glucose, capillary     Status: Abnormal   Collection Time: 10/22/21 12:56 PM  Result Value Ref Range   Glucose-Capillary 246 (H) 70 - 99 mg/dL    Comment: Glucose reference range applies only to samples taken after fasting for at least 8 hours.  Glucose, capillary     Status: Abnormal   Collection Time: 10/22/21  5:05 PM  Result Value Ref Range   Glucose-Capillary 250 (H) 70 - 99 mg/dL    Comment: Glucose reference range applies only to samples taken after fasting for at least 8 hours.  Glucose, capillary     Status: Abnormal   Collection Time: 10/22/21  8:49 PM  Result Value Ref Range   Glucose-Capillary 216 (H) 70 - 99 mg/dL    Comment: Glucose reference range applies only to samples taken after fasting for at least 8 hours.  Basic metabolic panel     Status:  Abnormal   Collection Time: 10/23/21  4:34 AM  Result Value Ref Range   Sodium 129 (L) 135 - 145 mmol/L   Potassium 4.3 3.5 - 5.1 mmol/L   Chloride 94 (L) 98 - 111 mmol/L   CO2 24 22 - 32 mmol/L   Glucose, Bld 169 (H) 70 - 99 mg/dL    Comment: Glucose reference range applies only to samples taken after fasting for at least 8 hours.   BUN 59 (H) 8 - 23 mg/dL   Creatinine, Ser 5.18 (H) 0.44 - 1.00 mg/dL   Calcium 8.4 (L) 8.9 - 10.3 mg/dL   GFR, Estimated 8 (L) >60 mL/min    Comment: (NOTE) Calculated using the CKD-EPI Creatinine Equation (2021)    Anion gap 11 5 - 15    Comment: Performed at [facility], [address] [place], [place] [address]  CBC     Status: Abnormal   Collection Time: 10/23/21  4:34 AM  Result Value Ref Range   WBC 8.0 4.0 - 10.5 K/uL   RBC 2.91 (L) 3.87 - 5.11 MIL/uL   Hemoglobin 8.3 (L) 12.0 - 15.0 g/dL   HCT 24.4 (L) 36.0 - 46.0 %   MCV 83.8 80.0 - 100.0 fL   MCH 28.5 26.0 - 34.0 pg   MCHC 34.0 30.0 -  36.0 g/dL   RDW 12.5 11.5 - 15.5 %   Platelets 218 150 - 400 K/uL   nRBC 0.0 0.0 - 0.2 %    Comment: Performed at [facility], [address] [place], [place] [address]  Magnesium     Status: None   Collection Time: 10/23/21  4:34 AM  Result Value Ref Range   Magnesium 2.4 1.7 - 2.4 mg/dL    Comment: Performed at [facility], [address] [place], [place] [address]  Glucose, capillary     Status: Abnormal   Collection Time: 10/23/21  8:01 AM  Result Value Ref Range   Glucose-Capillary 203 (H) 70 - 99 mg/dL    Comment: Glucose reference range applies only to samples taken after fasting for at least 8 hours.  Glucose, capillary     Status: Abnormal   Collection Time: 10/23/21 12:20 PM  Result Value Ref Range   Glucose-Capillary 109 (H) 70 - 99 mg/dL    Comment: Glucose reference range applies only to samples taken after fasting for at least 8 hours.  Glucose, capillary     Status: Abnormal   Collection Time: 10/23/21  4:33 PM  Result Value Ref Range   Glucose-Capillary 197 (H) 70 - 99 mg/dL    Comment: Glucose reference range applies only to samples taken after fasting for at least 8 hours.  Glucose, capillary     Status: Abnormal   Collection Time: 10/23/21  8:39 PM  Result Value Ref Range   Glucose-Capillary 173 (H) 70 - 99 mg/dL    Comment: Glucose reference range applies only to samples taken after fasting for at least 8 hours.  Basic metabolic panel     Status: Abnormal   Collection Time: 10/24/21  5:13 AM  Result Value Ref Range   Sodium 132 (L) 135 - 145 mmol/L   Potassium 4.3 3.5 - 5.1 mmol/L   Chloride 95 (L) 98 - 111 mmol/L   CO2 25 22 - 32 mmol/L   Glucose, Bld 150 (H) 70 - 99 mg/dL    Comment: Glucose reference range applies only to samples taken after fasting for at least 8  hours.   BUN 39 (H) 8 - 23 mg/dL   Creatinine, Ser 3.71 (H) 0.44 - 1.00 mg/dL   Calcium 8.5 (L) 8.9 - 10.3 mg/dL   GFR, Estimated 12 (L) >60 mL/min    Comment:  (NOTE) Calculated using the CKD-EPI Creatinine Equation (2021)    Anion gap 12 5 - 15    Comment: Performed at [facility], [address] [place], [place] [address]  CBC     Status: Abnormal   Collection Time: 10/24/21  5:13 AM  Result Value Ref Range   WBC 8.0 4.0 - 10.5 K/uL   RBC 2.83 (L) 3.87 - 5.11 MIL/uL   Hemoglobin 8.2 (L) 12.0 - 15.0 g/dL   HCT 24.3 (L) 36.0 - 46.0 %   MCV 85.9 80.0 - 100.0 fL   MCH 29.0 26.0 - 34.0 pg   MCHC 33.7 30.0 - 36.0 g/dL   RDW 12.9 11.5 - 15.5 %   Platelets 220 150 - 400 K/uL   nRBC 0.0 0.0 - 0.2 %    Comment: Performed at [facility], [address] [place], [place] [address]  Magnesium     Status: None   Collection Time: 10/24/21  5:13 AM  Result Value Ref Range   Magnesium 1.9 1.7 - 2.4 mg/dL    Comment: Performed at [facility], [address] [place], [place] [address]  Glucose, capillary     Status: Abnormal   Collection Time: 10/24/21  8:01 AM  Result Value Ref Range   Glucose-Capillary 160 (H) 70 - 99 mg/dL    Comment: Glucose reference range applies only to samples taken after fasting for at least 8 hours.  Glucose, capillary     Status: Abnormal   Collection Time: 10/24/21 12:26 PM  Result Value Ref Range   Glucose-Capillary 174 (H) 70 - 99 mg/dL    Comment: Glucose reference range applies only to samples taken after fasting for at least 8 hours.  Glucose, capillary     Status: Abnormal   Collection Time: 10/24/21  4:32 PM  Result Value Ref Range   Glucose-Capillary 205 (H) 70 - 99 mg/dL    Comment: Glucose reference range applies only to samples taken after fasting for at least 8 hours.  Glucose, capillary     Status: Abnormal   Collection Time: 10/24/21  8:52 PM  Result Value Ref Range   Glucose-Capillary 201 (H) 70 - 99 mg/dL    Comment: Glucose reference range applies only to samples taken after fasting for at least 8 hours.  Basic metabolic panel     Status: Abnormal   Collection Time:  10/25/21  4:16 AM  Result Value Ref Range   Sodium 130 (L) 135 - 145 mmol/L   Potassium 4.1 3.5 - 5.1 mmol/L   Chloride 92 (L) 98 - 111 mmol/L   CO2 27 22 - 32 mmol/L   Glucose, Bld 176 (H) 70 - 99 mg/dL    Comment: Glucose reference range applies only to samples taken after fasting for at least 8 hours.   BUN 29 (H) 8 - 23 mg/dL   Creatinine, Ser 3.09 (H) 0.44 - 1.00 mg/dL   Calcium 8.2 (L) 8.9 - 10.3 mg/dL   GFR, Estimated 15 (L) >60 mL/min    Comment: (NOTE) Calculated using the CKD-EPI Creatinine Equation (2021)    Anion gap 11 5 - 15    Comment: Performed at [facility], [address], [place], [place] [address]  CBC     Status: Abnormal   Collection Time: 10/25/21  4:16 AM  Result Value Ref Range   WBC 9.5 4.0 - 10.5 K/uL   RBC 2.92 (L) 3.87 - 5.11 MIL/uL   Hemoglobin 8.2 (L) 12.0 - 15.0 g/dL   HCT 24.5 (L) 36.0 - 46.0 %   MCV 83.9 80.0 - 100.0 fL   MCH 28.1 26.0 - 34.0 pg   MCHC 33.5 30.0 - 36.0 g/dL   RDW 12.6 11.5 - 15.5 %   Platelets 224 150 - 400 K/uL   nRBC 0.0 0.0 - 0.2 %    Comment: Performed at [facility], [address], [place], [place] [address]  Magnesium     Status: None   Collection Time: 10/25/21  4:16 AM  Result Value Ref Range   Magnesium 1.8 1.7 - 2.4 mg/dL    Comment: Performed at [facility], [address] [place], [place] [address]  Glucose, capillary     Status: Abnormal   Collection Time: 10/25/21  8:05 AM  Result Value Ref Range   Glucose-Capillary 168 (H) 70 - 99 mg/dL    Comment: Glucose reference range applies only to samples taken after fasting for at least 8 hours.  Glucose, capillary     Status: Abnormal   Collection Time: 10/25/21 11:23 AM  Result Value Ref Range   Glucose-Capillary 201 (H) 70 - 99 mg/dL    Comment: Glucose reference range applies only to samples taken after fasting for at least 8 hours.  Glucose, capillary     Status: Abnormal   Collection Time: 10/25/21  4:38 PM  Result Value  Ref Range   Glucose-Capillary 113 (H) 70 - 99 mg/dL    Comment: Glucose reference range applies only to samples taken after fasting for at least 8 hours.  MRSA Next Gen by PCR, Nasal     Status: None   Collection Time: 10/25/21  5:52 PM   Specimen: Nasal Mucosa; Nasal Swab  Result Value Ref Range   MRSA by PCR Next Gen NOT DETECTED NOT DETECTED    Comment: (NOTE) The GeneXpert MRSA Assay (FDA approved for NASAL specimens only), is one component of a comprehensive MRSA colonization surveillance program. It is not intended to diagnose MRSA infection nor to guide or monitor treatment for MRSA infections. Test performance is not FDA approved in patients less than 62 years old. Performed at [facility], [address] [place], [place] [address]   Glucose, capillary     Status: Abnormal   Collection Time: 10/25/21  9:23 PM  Result Value Ref Range   Glucose-Capillary 226 (H) 70 - 99 mg/dL    Comment: Glucose reference range applies only to samples taken after fasting for at least 8 hours.  Basic metabolic panel     Status: Abnormal   Collection Time: 10/26/21  4:16 AM  Result Value Ref Range   Sodium 134 (L) 135 - 145 mmol/L   Potassium 4.1 3.5 - 5.1 mmol/L   Chloride 97 (L) 98 - 111 mmol/L   CO2 29 22 - 32 mmol/L   Glucose, Bld 165 (H) 70 - 99 mg/dL    Comment: Glucose reference range applies only to samples taken after fasting for at least 8 hours.   BUN 24 (H) 8 - 23 mg/dL   Creatinine, Ser 3.16 (H) 0.44 - 1.00 mg/dL   Calcium 8.1 (L) 8.9 - 10.3 mg/dL   GFR, Estimated 14 (L) >60 mL/min  Comment: (NOTE) Calculated using the CKD-EPI Creatinine Equation (2021)    Anion gap 8 5 - 15    Comment: Performed at [facility], [address] [place], [place] [address]  Glucose, capillary     Status: Abnormal   Collection Time: 10/26/21  7:37 AM  Result Value Ref Range   Glucose-Capillary 170 (H) 70 - 99 mg/dL    Comment: Glucose reference range applies only to  samples taken after fasting for at least 8 hours.  Glucose, capillary     Status: Abnormal   Collection Time: 10/26/21  8:46 AM  Result Value Ref Range   Glucose-Capillary 158 (H) 70 - 99 mg/dL    Comment: Glucose reference range applies only to samples taken after fasting for at least 8 hours.  Glucose, capillary     Status: Abnormal   Collection Time: 10/26/21 11:18 AM  Result Value Ref Range   Glucose-Capillary 140 (H) 70 - 99 mg/dL    Comment: Glucose reference range applies only to samples taken after fasting for at least 8 hours.  Glucose, capillary     Status: Abnormal   Collection Time: 10/26/21  4:44 PM  Result Value Ref Range   Glucose-Capillary 227 (H) 70 - 99 mg/dL    Comment: Glucose reference range applies only to samples taken after fasting for at least 8 hours.  Glucose, capillary     Status: Abnormal   Collection Time: 10/26/21  9:44 PM  Result Value Ref Range   Glucose-Capillary 217 (H) 70 - 99 mg/dL    Comment: Glucose reference range applies only to samples taken after fasting for at least 8 hours.  Basic metabolic panel     Status: Abnormal   Collection Time: 10/27/21  5:32 AM  Result Value Ref Range   Sodium 129 (L) 135 - 145 mmol/L   Potassium 4.0 3.5 - 5.1 mmol/L   Chloride 88 (L) 98 - 111 mmol/L   CO2 27 22 - 32 mmol/L   Glucose, Bld 190 (H) 70 - 99 mg/dL    Comment: Glucose reference range applies only to samples taken after fasting for at least 8 hours.   BUN 38 (H) 8 - 23 mg/dL   Creatinine, Ser 4.08 (H) 0.44 - 1.00 mg/dL   Calcium 8.3 (L) 8.9 - 10.3 mg/dL   GFR, Estimated 11 (L) >60 mL/min    Comment: (NOTE) Calculated using the CKD-EPI Creatinine Equation (2021)    Anion gap 14 5 - 15    Comment: Performed at [facility], [address] [place], [place] [address]  Glucose, capillary     Status: Abnormal   Collection Time: 10/27/21  7:34 AM  Result Value Ref Range   Glucose-Capillary 197 (H) 70 - 99 mg/dL    Comment: Glucose  reference range applies only to samples taken after fasting for at least 8 hours.  Glucose, capillary     Status: Abnormal   Collection Time: 10/27/21  1:04 PM  Result Value Ref Range   Glucose-Capillary 173 (H) 70 - 99 mg/dL    Comment: Glucose reference range applies only to samples taken after fasting for at least 8 hours.  Glucose, capillary     Status: Abnormal   Collection Time: 10/27/21  4:03 PM  Result Value Ref Range   Glucose-Capillary 180 (H) 70 - 99 mg/dL    Comment: Glucose reference range applies only to samples taken after fasting for at least 8 hours.  Glucose, capillary  Status: Abnormal   Collection Time: 10/27/21  9:49 PM  Result Value Ref Range   Glucose-Capillary 264 (H) 70 - 99 mg/dL    Comment: Glucose reference range applies only to samples taken after fasting for at least 8 hours.  Basic metabolic panel     Status: Abnormal   Collection Time: 10/28/21  5:04 AM  Result Value Ref Range   Sodium 132 (L) 135 - 145 mmol/L   Potassium 3.9 3.5 - 5.1 mmol/L   Chloride 96 (L) 98 - 111 mmol/L   CO2 26 22 - 32 mmol/L   Glucose, Bld 205 (H) 70 - 99 mg/dL    Comment: Glucose reference range applies only to samples taken after fasting for at least 8 hours.   BUN 25 (H) 8 - 23 mg/dL   Creatinine, Ser 3.20 (H) 0.44 - 1.00 mg/dL   Calcium 8.4 (L) 8.9 - 10.3 mg/dL   GFR, Estimated 14 (L) >60 mL/min    Comment: (NOTE) Calculated using the CKD-EPI Creatinine Equation (2021)    Anion gap 10 5 - 15    Comment: Performed at [facility], [address] [place], [place] [address]  CBC     Status: Abnormal   Collection Time: 10/28/21  5:04 AM  Result Value Ref Range   WBC 13.7 (H) 4.0 - 10.5 K/uL   RBC 2.94 (L) 3.87 - 5.11 MIL/uL   Hemoglobin 8.3 (L) 12.0 - 15.0 g/dL   HCT 25.0 (L) 36.0 - 46.0 %   MCV 85.0 80.0 - 100.0 fL   MCH 28.2 26.0 - 34.0 pg   MCHC 33.2 30.0 - 36.0 g/dL   RDW 12.6 11.5 - 15.5 %   Platelets 225 150 - 400 K/uL   nRBC 0.0 0.0 - 0.2 %     Comment: Performed at [facility], [address] [place], [place] [address]  Magnesium     Status: None   Collection Time: 10/28/21  5:04 AM  Result Value Ref Range   Magnesium 1.8 1.7 - 2.4 mg/dL    Comment: Performed at [facility], [address] [place], [place] [address]  Glucose, capillary     Status: Abnormal   Collection Time: 10/28/21  7:44 AM  Result Value Ref Range   Glucose-Capillary 203 (H) 70 - 99 mg/dL    Comment: Glucose reference range applies only to samples taken after fasting for at least 8 hours.  Glucose, capillary     Status: Abnormal   Collection Time: 10/28/21 11:41 AM  Result Value Ref Range   Glucose-Capillary 208 (H) 70 - 99 mg/dL    Comment: Glucose reference range applies only to samples taken after fasting for at least 8 hours.  Glucose, capillary     Status: Abnormal   Collection Time: 10/28/21  4:10 PM  Result Value Ref Range   Glucose-Capillary 105 (H) 70 - 99 mg/dL    Comment: Glucose reference range applies only to samples taken after fasting for at least 8 hours.  Glucose, capillary     Status: Abnormal   Collection Time: 10/28/21  9:53 PM  Result Value Ref Range   Glucose-Capillary 252 (H) 70 - 99 mg/dL    Comment: Glucose reference range applies only to samples taken after fasting for at least 8 hours.  Basic metabolic panel     Status: Abnormal   Collection Time: 10/29/21  4:44 AM  Result Value Ref Range   Sodium 134 (L) 135 - 145 mmol/L  Potassium 3.9 3.5 - 5.1 mmol/L   Chloride 98 98 - 111 mmol/L   CO2 27 22 - 32 mmol/L   Glucose, Bld 176 (H) 70 - 99 mg/dL    Comment: Glucose reference range applies only to samples taken after fasting for at least 8 hours.   BUN 22 8 - 23 mg/dL   Creatinine, Ser 2.53 (H) 0.44 - 1.00 mg/dL   Calcium 8.3 (L) 8.9 - 10.3 mg/dL   GFR, Estimated 19 (L) >60 mL/min    Comment: (NOTE) Calculated using the CKD-EPI Creatinine Equation (2021)    Anion gap 9 5 - 15    Comment: Performed  at [facility], [address] [place], [place] [address]  CBC     Status: Abnormal   Collection Time: 10/29/21  4:44 AM  Result Value Ref Range   WBC 10.3 4.0 - 10.5 K/uL   RBC 2.47 (L) 3.87 - 5.11 MIL/uL   Hemoglobin 7.3 (L) 12.0 - 15.0 g/dL   HCT 21.6 (L) 36.0 - 46.0 %   MCV 87.4 80.0 - 100.0 fL   MCH 29.6 26.0 - 34.0 pg   MCHC 33.8 30.0 - 36.0 g/dL   RDW 12.7 11.5 - 15.5 %   Platelets 195 150 - 400 K/uL   nRBC 0.0 0.0 - 0.2 %    Comment: Performed at [facility], [address] [place], [place] [address]  Magnesium     Status: Abnormal   Collection Time: 10/29/21  4:44 AM  Result Value Ref Range   Magnesium 1.6 (L) 1.7 - 2.4 mg/dL    Comment: Performed at [facility], [address] [place], [place] [address]  Glucose, capillary     Status: Abnormal   Collection Time: 10/29/21 11:45 AM  Result Value Ref Range   Glucose-Capillary 292 (H) 70 - 99 mg/dL    Comment: Glucose reference range applies only to samples taken after fasting for at least 8 hours.  Glucose, capillary     Status: Abnormal   Collection Time: 10/29/21  4:06 PM  Result Value Ref Range   Glucose-Capillary 273 (H) 70 - 99 mg/dL    Comment: Glucose reference range applies only to samples taken after fasting for at least 8 hours.  Glucose, capillary     Status: Abnormal   Collection Time: 10/29/21  8:18 PM  Result Value Ref Range   Glucose-Capillary 296 (H) 70 - 99 mg/dL    Comment: Glucose reference range applies only to samples taken after fasting for at least 8 hours.  Glucose, capillary     Status: Abnormal   Collection Time: 10/30/21  7:42 AM  Result Value Ref Range   Glucose-Capillary 179 (H) 70 - 99 mg/dL    Comment: Glucose reference range applies only to samples taken after fasting for at least 8 hours.  Glucose, capillary     Status: Abnormal   Collection Time: 10/30/21 11:46 AM  Result Value Ref Range   Glucose-Capillary 263 (H) 70 - 99 mg/dL    Comment:  Glucose reference range applies only to samples taken after fasting for at least 8 hours.  Glucose, capillary     Status: Abnormal   Collection Time: 10/30/21  3:40 PM  Result Value Ref Range   Glucose-Capillary 203 (H) 70 - 99 mg/dL    Comment: Glucose reference range applies only to samples taken after fasting for at least 8 hours.    Radiology PERIPHERAL VASCULAR CATHETERIZATION  Result Date: 10/26/2021  See surgical note for result.   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  To get a follow-up duplex next month in the office.  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  Has undergone left renal artery stent placement but developed worsening renal dysfunction after stent placement and is now on dialysis.  We discussed treating her right renal artery stenosis in hopes of improving her blood pressure control as well as potentially improving renal function.  Type II diabetes mellitus with renal manifestations (HCC) blood glucose control important in reducing the progression of atherosclerotic disease. Also, involved in wound healing. On appropriate medications.   Acute renal failure superimposed on stage 3a chronic kidney disease ([place]) Now on dialysis.  We discussed that treating her right renal artery stenosis has some chance of improving her renal dysfunction but there is certainly no guarantee.  Renal artery stenosis (HCC) Has undergone left renal artery stent placement but developed worsening renal dysfunction and is now on dialysis.  Has a known right renal artery stenosis.  We discussed that treating the right renal artery stenosis may improve her blood pressure and there is a chance it could get her off dialysis although that is difficult to discern.  I will discuss with her nephrologist if he thinks it is reasonable we can consider renal artery intervention in the next couple of weeks.  She would be very desirous of this if  there is any chance of getting her off dialysis.    [name], MD  11/24/2021 12:50 PM    This note was created with Dragon medical transcription system.  Any errors from dictation are purely unintentional

## 2021-11-27 ENCOUNTER — Telehealth (INDEPENDENT_AMBULATORY_CARE_PROVIDER_SITE_OTHER): Payer: Self-pay

## 2021-11-27 NOTE — Telephone Encounter (Signed)
I attempted to contact the patient to schedule a right renal angio with Dr. Lucky Cowboy. A  message was left for a return call.

## 2021-11-29 NOTE — Telephone Encounter (Signed)
Patient returned my call and is now scheduled with Dr. Lucky Cowboy for a right renal stent placement on 12/04/21 with a 8:15 am [facility]. [place]-procedure instructions were discussed and will be mailed.

## 2021-12-04 ENCOUNTER — Other Ambulatory Visit: Payer: Self-pay

## 2021-12-04 ENCOUNTER — Ambulatory Visit
Admission: RE | Admit: 2021-12-04 | Discharge: 2021-12-04 | Disposition: A | Discharge status: Home / Self Care | Payer: Medicare Other | Source: Ambulatory Visit | Attending: Vascular Surgery | Admitting: Vascular Surgery

## 2021-12-04 ENCOUNTER — Encounter
Admission: RE | Disposition: A | Discharge status: Home / Self Care | Payer: Self-pay | Source: Ambulatory Visit | Attending: Vascular Surgery

## 2021-12-04 ENCOUNTER — Encounter: Payer: Self-pay | Admitting: Vascular Surgery

## 2021-12-04 DIAGNOSIS — I129 Hypertensive chronic kidney disease with stage 1 through stage 4 chronic kidney disease, or unspecified chronic kidney disease: Secondary | ICD-10-CM | POA: Insufficient documentation

## 2021-12-04 DIAGNOSIS — N1831 Chronic kidney disease, stage 3a: Secondary | ICD-10-CM | POA: Insufficient documentation

## 2021-12-04 DIAGNOSIS — N186 End stage renal disease: Secondary | ICD-10-CM | POA: Diagnosis not present

## 2021-12-04 DIAGNOSIS — I150 Renovascular hypertension: Secondary | ICD-10-CM | POA: Diagnosis not present

## 2021-12-04 DIAGNOSIS — I701 Atherosclerosis of renal artery: Secondary | ICD-10-CM | POA: Diagnosis present

## 2021-12-04 DIAGNOSIS — Z9889 Other specified postprocedural states: Secondary | ICD-10-CM

## 2021-12-04 DIAGNOSIS — E1122 Type 2 diabetes mellitus with diabetic chronic kidney disease: Secondary | ICD-10-CM | POA: Diagnosis not present

## 2021-12-04 DIAGNOSIS — I169 Hypertensive crisis, unspecified: Secondary | ICD-10-CM | POA: Diagnosis not present

## 2021-12-04 DIAGNOSIS — I723 Aneurysm of iliac artery: Secondary | ICD-10-CM | POA: Insufficient documentation

## 2021-12-04 DIAGNOSIS — I700 Atherosclerosis of aorta: Secondary | ICD-10-CM | POA: Diagnosis not present

## 2021-12-04 DIAGNOSIS — Z992 Dependence on renal dialysis: Secondary | ICD-10-CM | POA: Insufficient documentation

## 2021-12-04 DIAGNOSIS — Z95828 Presence of other vascular implants and grafts: Secondary | ICD-10-CM

## 2021-12-04 HISTORY — PX: RENAL ANGIOGRAPHY: CATH118260

## 2021-12-04 LAB — GLUCOSE, CAPILLARY: Glucose-Capillary: 174 mg/dL — ABNORMAL HIGH (ref 70–99)

## 2021-12-04 LAB — POTASSIUM (ARMC VASCULAR LAB ONLY): Potassium (ARMC vascular lab): 3.5 mmol/L (ref 3.5–5.1)

## 2021-12-04 SURGERY — RENAL ANGIOGRAPHY
Anesthesia: Moderate Sedation | Laterality: Right

## 2021-12-04 MED ORDER — METHYLPREDNISOLONE SODIUM SUCC 125 MG IJ SOLR: 125.0000 mg | Freq: Once | INTRAMUSCULAR | Status: DC | PRN

## 2021-12-04 MED ORDER — HYDROMORPHONE HCL 1 MG/ML IJ SOLN: 1.0000 mg | Freq: Once | INTRAMUSCULAR | Status: DC | PRN

## 2021-12-04 MED ORDER — FENTANYL CITRATE (PF) 100 MCG/2ML IJ SOLN
INTRAMUSCULAR | Status: AC
  Filled 2021-12-04: qty 2

## 2021-12-04 MED ORDER — HEPARIN SODIUM (PORCINE) 1000 UNIT/ML IJ SOLN
INTRAMUSCULAR | Status: AC
  Filled 2021-12-04: qty 10

## 2021-12-04 MED ORDER — SODIUM CHLORIDE 0.9 % IV SOLN: INTRAVENOUS | Status: DC

## 2021-12-04 MED ORDER — IODIXANOL 320 MG/ML IV SOLN
INTRAVENOUS | Status: DC | PRN
  Administered 2021-12-04: 35 mL via INTRA_ARTERIAL

## 2021-12-04 MED ORDER — DIPHENHYDRAMINE HCL 50 MG/ML IJ SOLN: 50.0000 mg | Freq: Once | INTRAMUSCULAR | Status: DC | PRN

## 2021-12-04 MED ORDER — MIDAZOLAM HCL 5 MG/5ML IJ SOLN
INTRAMUSCULAR | Status: AC
  Filled 2021-12-04: qty 5

## 2021-12-04 MED ORDER — FENTANYL CITRATE (PF) 100 MCG/2ML IJ SOLN
INTRAMUSCULAR | Status: DC | PRN
  Administered 2021-12-04: 50 ug via INTRAVENOUS

## 2021-12-04 MED ORDER — FAMOTIDINE 20 MG PO TABS: 40.0000 mg | ORAL_TABLET | Freq: Once | ORAL | Status: DC | PRN

## 2021-12-04 MED ORDER — ONDANSETRON HCL 4 MG/2ML IJ SOLN: 4.0000 mg | Freq: Four times a day (QID) | INTRAMUSCULAR | Status: DC | PRN

## 2021-12-04 MED ORDER — CEFAZOLIN SODIUM-DEXTROSE 1-4 GM/50ML-% IV SOLN
INTRAVENOUS | Status: AC
  Administered 2021-12-04: 1 g via INTRAVENOUS
  Filled 2021-12-04: qty 50

## 2021-12-04 MED ORDER — MIDAZOLAM HCL 2 MG/ML PO SYRP: 8.0000 mg | ORAL_SOLUTION | Freq: Once | ORAL | Status: DC | PRN

## 2021-12-04 MED ORDER — MIDAZOLAM HCL 2 MG/2ML IJ SOLN
INTRAMUSCULAR | Status: DC | PRN
  Administered 2021-12-04: 1 mg via INTRAVENOUS

## 2021-12-04 MED ORDER — HEPARIN SODIUM (PORCINE) 1000 UNIT/ML IJ SOLN
INTRAMUSCULAR | Status: DC | PRN
  Administered 2021-12-04: 4000 [IU] via INTRAVENOUS

## 2021-12-04 MED ORDER — CEFAZOLIN SODIUM-DEXTROSE 1-4 GM/50ML-% IV SOLN: 1.0000 g | INTRAVENOUS | Status: AC

## 2021-12-04 SURGICAL SUPPLY — 17 items
BALLN LUTONIX DCB 4X40X130 (BALLOONS) ×1
BALLOON LUTONIX DCB 4X40X130 (BALLOONS) IMPLANT
CATH ANGIO 5F PIGTAIL 65CM (CATHETERS) IMPLANT
CATH BEACON 5 .035 65 C2 TIP (CATHETERS) IMPLANT
COVER PROBE ULTRASOUND 5X96 (MISCELLANEOUS) IMPLANT
DEVICE STARCLOSE SE CLOSURE (Vascular Products) IMPLANT
DRAPE C-ARM 35X43 STRL (DRAPES) IMPLANT
GLIDEWIRE STIFF .35X180X3 HYDR (WIRE) IMPLANT
KIT ENCORE 26 ADVANTAGE (KITS) IMPLANT
PACK ANGIOGRAPHY (CUSTOM PROCEDURE TRAY) ×1 IMPLANT
SHEATH ANL2 6FRX45 HC (SHEATH) IMPLANT
SHEATH BRITE TIP 5FRX11 (SHEATH) IMPLANT
STENT LIFESTREAM 5X26X80 (Permanent Stent) IMPLANT
SYR MEDRAD MARK 7 150ML (SYRINGE) IMPLANT
TUBING CONTRAST HIGH PRESS 72 (TUBING) IMPLANT
WIRE GUIDERIGHT .035X150 (WIRE) IMPLANT
WIRE MAGIC TOR.035 180C (WIRE) IMPLANT

## 2021-12-04 NOTE — Op Note (Signed)
[facility]  Percutaneous Study/Intervention Procedural Note    Surgeon(s): [employer]   Assistants: None  Pre-operative Diagnosis: Right renal artery stenosis, renovascular hypertension, end-stage renal disease  Post-operative diagnosis:  Same  Procedure(s) Performed:             1.  Ultrasound guidance for vascular access right femoral artery             2.  Catheter placement into right renal artery from right femoral approach             3.  Aortogram and selective right renal angiogram             4.  Balloon expandable stent placement to the right renal artery with a 5 mm diameter x 26 mm length stent              5.  StarClose closure device right femoral artery              Contrast: 35 cc  EBL: 4 cc  Fluoro Time: 4.8 minutes  Moderate conscious sedation: Approximately 26 minutes with 1 mg of Versed and 50 mcg of Fentanyl  Indications:  The patient is a 76 year old female with worsening severe hypertension despite multiple hypertensive medications.  She had an admission about 2 months ago for hypertensive crisis and a left renal stent was done but she ultimately developed renal failure and has gone on dialysis. The patient has a known right renal artery stenosis which was high-grade as well so she is brought in for hopes of salvaging any renal function as well as potentially improving her blood pressure control.  Given the clinical scenario and the noninvasive findings, angiogram is indicated for further evaluation of her renal artery and potential treatment. Risks and benefits are discussed and informed consent is obtained.  Procedure:  The patient was identified and appropriate procedural time out was performed.  The patient was then placed supine on the table and prepped and draped in the usual sterile fashion. Moderate conscious sedation was administered with a face to face encounter with the patient throughout the procedure with my supervision of the  RN administering medicines and monitoring the patients vital signs and mental status throughout from the start of the procedure until the patient was taken to the recovery room  Ultrasound was used to evaluate the right common femoral artery.  It was patent .  A digital ultrasound image was acquired.  A Seldinger needle was used to access the right common femoral artery under direct ultrasound guidance and a permanent image was performed.  A 0.035 J wire was advanced without resistance and a 5Fr sheath was placed.  Pigtail catheter was placed into the aorta at the L1 level and an AP aortogram was performed. This demonstrated what appeared to be a hemodynamically significant stenosis of the right renal artery although the origin was not well-opacified on the aortogram.  The previously placed left renal artery stent did not appear to have any flow.  The aorta was highly calcific but not stenotic.  The previously placed right iliac stent for aneurysm disease was 1 patent. The patient was then systemically heparinized with 4000 units of intravenous heparin. I used a C2 catheter to cannulate the right renal artery and selective imaging was performed. This confirmed a greater than 80% stenosis of the right renal artery.  At this point I selected the Magic torque wire and crossed the lesion without difficulty with the wire but  the catheter would not track across this due to the highly calcific tight stenosis.  I predilated the lesion with a 4 mm diameter by 4 cm length Lutonix drug-coated angioplasty balloon inflated to 10 atm for 1 minute.  Following this, I took an Engineer, maintenance (IT) over the Magic torque wire and was able to advance this into the right renal artery. I then selected a 5 mm diameter x 26 mm length balloon expandable stent and brought this across the lesion.  This was deployed encompassing the lesion with its proximal extent going back into the aorta for a mm or two.  This was inflated to 12 ATM and the waist  resolved.  Completion angiogram showed a brisk nephrogram with a patent stent and less than 10% residual stenosis.  The guide catheter was removed. Oblique arteriogram was performed of the right femoral artery and StarClose closure device was deployed in the usual fashion with excellent hemostatic result. The patient was taken to the recovery room in stable condition having tolerated the procedure well.  Findings:               Aortogram/Renal Arteries: Greater than 80% stenosis of the right renal artery.  The aorta was highly calcific and the previously placed right iliac stent was widely patent and the left iliac artery was patent.  No obvious flow was seen in the left renal artery stent on the aortogram   Condition:  Stable  Complications: None   Dawn Ford 12/04/2021 10:34 AM  This note was created with Dragon Medical transcription system. Any errors in dictation are purely unintentional.

## 2021-12-04 NOTE — Interval H&P Note (Signed)
History and Physical Interval Note:  12/04/2021 8:20 AM  Surina L Schmoll  has presented today for surgery, with the diagnosis of R Renal Stent Placement    BARD   Renal artery stenosis.  The various methods of treatment have been discussed with the patient and family. After consideration of risks, benefits and other options for treatment, the patient has consented to  Procedure(s): RENAL ANGIOGRAPHY (Right) as a surgical intervention.  The patient's history has been reviewed, patient examined, no change in status, stable for surgery.  I have reviewed the patient's chart and labs.  Questions were answered to the patient's satisfaction.     [name]

## 2021-12-05 ENCOUNTER — Encounter: Payer: Self-pay | Admitting: Vascular Surgery

## 2021-12-09 ENCOUNTER — Encounter (HOSPITAL_COMMUNITY): Payer: Self-pay

## 2021-12-09 ENCOUNTER — Emergency Department (HOSPITAL_COMMUNITY)
Admission: EM | Admit: 2021-12-09 | Discharge: 2021-12-09 | Disposition: A | Discharge status: Home / Self Care | Payer: Medicare Other | Attending: Emergency Medicine | Admitting: Emergency Medicine

## 2021-12-09 ENCOUNTER — Emergency Department (HOSPITAL_COMMUNITY): Payer: Medicare Other

## 2021-12-09 ENCOUNTER — Other Ambulatory Visit: Payer: Self-pay

## 2021-12-09 DIAGNOSIS — K529 Noninfective gastroenteritis and colitis, unspecified: Secondary | ICD-10-CM | POA: Diagnosis not present

## 2021-12-09 DIAGNOSIS — Z7902 Long term (current) use of antithrombotics/antiplatelets: Secondary | ICD-10-CM | POA: Diagnosis not present

## 2021-12-09 DIAGNOSIS — Z7984 Long term (current) use of oral hypoglycemic drugs: Secondary | ICD-10-CM | POA: Insufficient documentation

## 2021-12-09 DIAGNOSIS — Z7982 Long term (current) use of aspirin: Secondary | ICD-10-CM | POA: Diagnosis not present

## 2021-12-09 DIAGNOSIS — R1084 Generalized abdominal pain: Secondary | ICD-10-CM | POA: Diagnosis present

## 2021-12-09 LAB — COMPREHENSIVE METABOLIC PANEL
ALT: 17 U/L (ref 0–44)
AST: 33 U/L (ref 15–41)
Albumin: 3.8 g/dL (ref 3.5–5.0)
Alkaline Phosphatase: 68 U/L (ref 38–126)
Anion gap: 16 — ABNORMAL HIGH (ref 5–15)
BUN: 26 mg/dL — ABNORMAL HIGH (ref 8–23)
CO2: 24 mmol/L (ref 22–32)
Calcium: 9.5 mg/dL (ref 8.9–10.3)
Chloride: 92 mmol/L — ABNORMAL LOW (ref 98–111)
Creatinine, Ser: 1.67 mg/dL — ABNORMAL HIGH (ref 0.44–1.00)
GFR, Estimated: 31 mL/min — ABNORMAL LOW (ref >60)
Glucose, Bld: 196 mg/dL — ABNORMAL HIGH (ref 70–99)
Potassium: 4.2 mmol/L (ref 3.5–5.1)
Sodium: 132 mmol/L — ABNORMAL LOW (ref 135–145)
Total Bilirubin: 1 mg/dL (ref 0.3–1.2)
Total Protein: 7.8 g/dL (ref 6.5–8.1)

## 2021-12-09 LAB — URINALYSIS, ROUTINE W REFLEX MICROSCOPIC
Bacteria, UA: NONE SEEN
Bilirubin Urine: NEGATIVE
Glucose, UA: NEGATIVE mg/dL
Hgb urine dipstick: NEGATIVE
Ketones, ur: 5 mg/dL — AB
Leukocytes,Ua: NEGATIVE
Nitrite: NEGATIVE
Protein, ur: 30 mg/dL — AB
Specific Gravity, Urine: 1.004 — ABNORMAL LOW (ref 1.005–1.030)
pH: 8 (ref 5.0–8.0)

## 2021-12-09 LAB — CBC WITH DIFFERENTIAL/PLATELET
Abs Immature Granulocytes: 0.04 10*3/uL (ref 0.00–0.07)
Basophils Absolute: 0 10*3/uL (ref 0.0–0.1)
Basophils Relative: 0 %
Eosinophils Absolute: 0.1 10*3/uL (ref 0.0–0.5)
Eosinophils Relative: 1 %
HCT: 29.7 % — ABNORMAL LOW (ref 36.0–46.0)
Hemoglobin: 10 g/dL — ABNORMAL LOW (ref 12.0–15.0)
Immature Granulocytes: 0 %
Lymphocytes Relative: 16 %
Lymphs Abs: 1.5 10*3/uL (ref 0.7–4.0)
MCH: 28.7 pg (ref 26.0–34.0)
MCHC: 33.7 g/dL (ref 30.0–36.0)
MCV: 85.3 fL (ref 80.0–100.0)
Monocytes Absolute: 0.8 10*3/uL (ref 0.1–1.0)
Monocytes Relative: 9 %
Neutro Abs: 7 10*3/uL (ref 1.7–7.7)
Neutrophils Relative %: 74 %
Platelets: 296 10*3/uL (ref 150–400)
RBC: 3.48 MIL/uL — ABNORMAL LOW (ref 3.87–5.11)
RDW: 14 % (ref 11.5–15.5)
WBC: 9.5 10*3/uL (ref 4.0–10.5)
nRBC: 0 % (ref 0.0–0.2)

## 2021-12-09 LAB — LIPASE, BLOOD: Lipase: 83 U/L — ABNORMAL HIGH (ref 11–51)

## 2021-12-09 LAB — PROTIME-INR
INR: 1 (ref 0.8–1.2)
Prothrombin Time: 13.1 seconds (ref 11.4–15.2)

## 2021-12-09 MED ORDER — ACETAMINOPHEN 500 MG PO TABS: 500.0000 mg | ORAL_TABLET | Freq: Four times a day (QID) | ORAL | 0 refills | Status: AC | PRN

## 2021-12-09 MED ORDER — MORPHINE SULFATE (PF) 2 MG/ML IV SOLN
2.0000 mg | INTRAVENOUS | Status: DC | PRN
  Administered 2021-12-09: 2 mg via INTRAVENOUS
  Filled 2021-12-09: qty 1

## 2021-12-09 NOTE — ED Provider Notes (Signed)
[facility] Provider Note   CSN: [id number] Arrival date & time: 12/09/21  9381     History  Chief Complaint  Patient presents with   Abdominal Pain    Dawn Ford is a 76 y.o. female.  Patient complains of pain to her left flank area patient reports that she has a history of kidney disease.  Patient has recently had a stent placed on the left patient reports that she has right kidney disease.  She began having pain in her abdomen yesterday patient reports that she did have normal urination this a.m. Pt is concerned that something is going on with her kidney  The history is provided by the patient. No language interpreter was used.  Abdominal Pain Pain location:  Generalized Pain quality: aching   Pain radiates to:  L flank      Home Medications Prior to Admission medications   Medication Sig Start Date End Date Taking? Authorizing Provider  aspirin EC 81 MG tablet Take 81 mg by mouth daily.    [provider]  atorvastatin (LIPITOR) 40 MG tablet Take 40 mg by mouth every evening.    [provider]  carvedilol (COREG) 25 MG tablet Take 1 tablet (25 mg total) by mouth 2 (two) times daily with a meal. 10/30/21   Fritzi Mandes, MD  cloNIDine (CATAPRES) 0.2 MG tablet Take 1 tablet (0.2 mg total) by mouth 2 (two) times daily. 10/30/21   Fritzi Mandes, MD  clopidogrel (PLAVIX) 75 MG tablet Take 1 tablet (75 mg total) by mouth daily. 10/31/21   Fritzi Mandes, MD  famotidine (PEPCID) 40 MG tablet Take 40 mg by mouth at bedtime.    [provider]  fexofenadine (ALLEGRA) 180 MG tablet Take 180 mg by mouth daily as needed for allergies.    [provider]  furosemide (LASIX) 40 MG tablet Take 1 tablet (40 mg total) by mouth daily. 10/31/21   Fritzi Mandes, MD  glimepiride (AMARYL) 4 MG tablet Take 2 mg by mouth 2 (two) times daily. 11/05/16   [provider]  hydrALAZINE (APRESOLINE) 100 MG tablet Take 1 tablet (100 mg total)  by mouth 3 (three) times daily. 10/30/21   Fritzi Mandes, MD  liraglutide (VICTOZA) 18 MG/3ML SOPN Inject 1.8 mg into the skin every evening.    [provider]  NIFEdipine (ADALAT CC) 60 MG 24 hr tablet Take 1 tablet (60 mg total) by mouth daily. 10/31/21   Fritzi Mandes, MD  pioglitazone (ACTOS) 15 MG tablet Take 7.5 mg by mouth daily.    [provider]      Allergies    Patient has no known allergies.    Review of Systems   Review of Systems  Gastrointestinal:  Positive for abdominal pain.  All other systems reviewed and are negative.   Physical Exam Updated Vital Signs BP (!) 162/69   Pulse 100   Temp 98.1 F (36.7 C) (Oral)   Resp 18   Ht 5\' 7"  (1.702 m)   Wt 77.1 kg   SpO2 100%   BMI 26.63 kg/m  Physical Exam Vitals and nursing note reviewed.  Constitutional:      Appearance: She is well-developed.  HENT:     Head: Normocephalic.  Cardiovascular:     Rate and Rhythm: Normal rate.  Pulmonary:     Effort: Pulmonary effort is normal.  Abdominal:     General: Abdomen is flat. Bowel sounds are normal. There is abdominal bruit. There  is no distension.     Palpations: Abdomen is soft.     Tenderness: There is left CVA tenderness.  Musculoskeletal:        General: Normal range of motion.     Cervical back: Normal range of motion.  Skin:    General: Skin is warm.  Neurological:     General: No focal deficit present.     Mental Status: She is alert and oriented to person, place, and time.     ED Results / Procedures / Treatments   Labs (all labs ordered are listed, but only abnormal results are displayed) Labs Reviewed  COMPREHENSIVE METABOLIC PANEL - Abnormal; Notable for the following components:      Result Value   Sodium 132 (*)    Chloride 92 (*)    Glucose, Bld 196 (*)    BUN 26 (*)    Creatinine, Ser 1.67 (*)    GFR, Estimated 31 (*)    Anion gap 16 (*)    All other components within normal limits  LIPASE, BLOOD - Abnormal; Notable  for the following components:   Lipase 83 (*)    All other components within normal limits  CBC WITH DIFFERENTIAL/PLATELET - Abnormal; Notable for the following components:   RBC 3.48 (*)    Hemoglobin 10.0 (*)    HCT 29.7 (*)    All other components within normal limits  PROTIME-INR  URINALYSIS, ROUTINE W REFLEX MICROSCOPIC    EKG None  Radiology CT Renal Stone Study  Result Date: 12/09/2021 CLINICAL DATA:  Left lower quadrant pain beginning yesterday. EXAM: CT ABDOMEN AND PELVIS WITHOUT CONTRAST TECHNIQUE: Multidetector CT imaging of the abdomen and pelvis was performed following the standard protocol without IV contrast. RADIATION DOSE REDUCTION: This exam was performed according to the departmental dose-optimization program which includes automated exposure control, adjustment of the mA and/or kV according to patient size and/or use of iterative reconstruction technique. COMPARISON:  10/17/2021 FINDINGS: Lower chest: No acute findings. Hepatobiliary: No mass visualized on this unenhanced exam. Gallbladder distention is noted, but is otherwise unremarkable in appearance. Pancreas: No mass or inflammatory process visualized on this unenhanced exam. Spleen:  Within normal limits in size. Adrenals/Urinary tract: Previously seen left renal swelling and perinephric soft tissue stranding has resolved since prior exam. Mild diffuse left renal atrophy is noted. No evidence of urolithiasis or hydronephrosis. Unremarkable unopacified urinary bladder. Stomach/Bowel: Small hiatal hernia again noted. Moderate wall thickening is seen involving the proximal sigmoid colon which is new since previous study. A few diverticula are noted in this region, however these do not appear inflamed. No evidence of perforation or abscess. Vascular/Lymphatic: No pathologically enlarged lymph nodes identified. Aortic atherosclerotic calcification noted. 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 Stent again seen in  the right common iliac artery, with stable native aneurysm measuring 2.6 cm in diameter. Bilateral renal artery stents are also noted. Reproductive:  No mass or other significant abnormality. Other:  None. Musculoskeletal:  No suspicious bone lesions identified. IMPRESSION: Nonspecific colitis involving the proximal sigmoid colon. No evidence of perforation or abscess. Resolution of left renal swelling and perinephric stranding since prior exam. No evidence of urolithiasis or hydronephrosis. Small hiatal hernia. 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 Recommend follow-up every 5 years. This recommendation follows ACR consensus guidelines: White Paper of the ACR Incidental Findings Committee II on Vascular Findings. J Am Coll Radiol 2013; 10:789-794. Stable 2.6 cm native right common iliac artery aneurysm, with prior stent graft  repair. Aortic Atherosclerosis (ICD10-I70.0). Electronically Signed   By: Marlaine Hind M.D.   On: 12/09/2021 10:15    Procedures Procedures    Medications Ordered in ED Medications  morphine (PF) 2 MG/ML injection 2 mg (2 mg Intravenous Given 12/09/21 0945)    ED Course/ Medical Decision Making/ A&P                           Medical Decision Making Pt complains of abdominal pain.  Pt reports she has had stents placed due to poor blood supply to her kidneys.  Pt denies fever or chills.  No cough or congestion  Amount and/or Complexity of Data Reviewed Independent Historian: spouse    Details: Pt is here with her husband who is supportive  External Data Reviewed: notes.    Details: Vascular notes reviewed  Labs: ordered. Decision-making details documented in ED Course.    Details: BUN is 26.  Patient's creatinine is 1.67 patient's hemoglobin is 10 Radiology: ordered and independent interpretation performed. Decision-making details documented in ED Course.    Details: CT renal resolution of left renal swelling purulent.  There is a stent seen in iliac.  CT  scan shows diffuse colitis  Risk OTC drugs. Prescription drug management. Risk Details: Dr. Kathrynn Humble was in to see and examine patient.  Patient is given a prescription for Tylenol she is advised to keep appointments with her primary care physician patient is advised to return to the emergency department if symptoms worsen or change           Final Clinical Impression(s) / ED Diagnoses Final diagnoses:  Colitis    Rx / DC Orders ED Discharge Orders     None      An After Visit Summary was printed and given to the patient.    Fransico Meadow, [place] 12/09/21 1413    Varney Biles, MD 12/10/21 1353

## 2021-12-09 NOTE — ED Triage Notes (Signed)
"  From home, started having llq abdominal pain last night, denies n/v/d" per pt

## 2021-12-09 NOTE — ED Notes (Signed)
Patient transported to CT 

## 2021-12-13 ENCOUNTER — Telehealth (INDEPENDENT_AMBULATORY_CARE_PROVIDER_SITE_OTHER): Payer: Self-pay

## 2021-12-13 NOTE — Telephone Encounter (Signed)
Spoke to pt and she states verbal understanding

## 2021-12-13 NOTE — Telephone Encounter (Signed)
She needs to have the dialysis center fax orders to our office and we will call to get that scheduled

## 2021-12-13 NOTE — Telephone Encounter (Signed)
Pt LVM stating that she is finished with dialysis and would like to schedule an appt to have port taken out. Please advise

## 2021-12-14 ENCOUNTER — Telehealth (INDEPENDENT_AMBULATORY_CARE_PROVIDER_SITE_OTHER): Payer: Self-pay

## 2021-12-14 NOTE — Telephone Encounter (Signed)
I attempted to contact the patient and a message was left for a return call to schedule a permcath removal. Patient called back and is scheduled with Dr. Lucky Cowboy on 12/18/21 with a 1:00 pm [facility].

## 2021-12-18 ENCOUNTER — Ambulatory Visit
Admission: RE | Admit: 2021-12-18 | Discharge: 2021-12-18 | Disposition: A | Discharge status: Home / Self Care | Payer: Medicare Other | Source: Ambulatory Visit | Attending: Vascular Surgery | Admitting: Vascular Surgery

## 2021-12-18 ENCOUNTER — Encounter
Admission: RE | Disposition: A | Discharge status: Home / Self Care | Payer: Self-pay | Source: Ambulatory Visit | Attending: Vascular Surgery

## 2021-12-18 DIAGNOSIS — Z4901 Encounter for fitting and adjustment of extracorporeal dialysis catheter: Secondary | ICD-10-CM | POA: Insufficient documentation

## 2021-12-18 DIAGNOSIS — N186 End stage renal disease: Secondary | ICD-10-CM | POA: Insufficient documentation

## 2021-12-18 HISTORY — PX: DIALYSIS/PERMA CATHETER REMOVAL: CATH118289

## 2021-12-18 SURGERY — DIALYSIS/PERMA CATHETER REMOVAL
Anesthesia: LOCAL

## 2021-12-18 MED ORDER — LIDOCAINE-EPINEPHRINE (PF) 1 %-1:200000 IJ SOLN
INTRAMUSCULAR | Status: DC | PRN
  Administered 2021-12-18: 20 mL

## 2021-12-18 SURGICAL SUPPLY — 4 items
CHLORAPREP W/TINT 26 (MISCELLANEOUS) IMPLANT
FORCEPS HALSTEAD CVD 5IN STRL (INSTRUMENTS) IMPLANT
SCALPEL PROTECTED #11 DISP (BLADE) IMPLANT
TRAY LACERAT/PLASTIC (MISCELLANEOUS) IMPLANT

## 2021-12-18 NOTE — Interval H&P Note (Signed)
History and Physical Interval Note:  12/18/2021 1:58 PM  Dawn Ford  has presented today for surgery, with the diagnosis of Perma Cath Removal    End Stage Renal.  The various methods of treatment have been discussed with the patient and family. After consideration of risks, benefits and other options for treatment, the patient has consented to  Procedure(s): DIALYSIS/PERMA CATHETER REMOVAL (N/A) as a surgical intervention.  The patient's history has been reviewed, patient examined, no change in status, stable for surgery.  I have reviewed the patient's chart and labs.  Questions were answered to the patient's satisfaction.     [name]

## 2021-12-18 NOTE — Op Note (Signed)
Operative Note     Preoperative diagnosis:   1. ESRD with return of renal function  Postoperative diagnosis:  1. Same as above  Procedure:  Removal of right jugular Permcath  Surgeon:  [name], MD  Anesthesia:  Local  EBL:  Minimal  Indication for the Procedure:  The patient has had return of renal function and no longer needs their permcath.  This can be removed.  Risks and benefits are discussed and informed consent is obtained.  Description of the Procedure:  The patient's right neck, chest and existing catheter were sterilely prepped and draped. The area around the catheter was anesthetized copiously with 1% lidocaine. The catheter was dissected out with curved hemostats until the cuff was freed from the surrounding fibrous sheath. The fiber sheath was transected, and the catheter was then removed in its entirety using gentle traction. Pressure was held and sterile dressings were placed. The patient tolerated the procedure well and was taken to the recovery room in stable condition.     [name]  12/18/2021, 2:27 PM This note was created with Dragon Medical transcription system. Any errors in dictation are purely unintentional.

## 2021-12-19 ENCOUNTER — Encounter: Payer: Self-pay | Admitting: Vascular Surgery

## 2021-12-24 ENCOUNTER — Emergency Department (HOSPITAL_COMMUNITY)
Admission: EM | Admit: 2021-12-24 | Discharge: 2021-12-24 | Disposition: A | Discharge status: Home / Self Care | Payer: Medicare Other | Attending: Emergency Medicine | Admitting: Emergency Medicine

## 2021-12-24 ENCOUNTER — Other Ambulatory Visit: Payer: Self-pay

## 2021-12-24 ENCOUNTER — Encounter (HOSPITAL_COMMUNITY): Payer: Self-pay | Admitting: Emergency Medicine

## 2021-12-24 DIAGNOSIS — T7840XA Allergy, unspecified, initial encounter: Secondary | ICD-10-CM | POA: Diagnosis not present

## 2021-12-24 DIAGNOSIS — E119 Type 2 diabetes mellitus without complications: Secondary | ICD-10-CM | POA: Diagnosis not present

## 2021-12-24 DIAGNOSIS — Z7984 Long term (current) use of oral hypoglycemic drugs: Secondary | ICD-10-CM | POA: Diagnosis not present

## 2021-12-24 DIAGNOSIS — Z79899 Other long term (current) drug therapy: Secondary | ICD-10-CM | POA: Insufficient documentation

## 2021-12-24 DIAGNOSIS — Z7982 Long term (current) use of aspirin: Secondary | ICD-10-CM | POA: Diagnosis not present

## 2021-12-24 DIAGNOSIS — I10 Essential (primary) hypertension: Secondary | ICD-10-CM | POA: Insufficient documentation

## 2021-12-24 DIAGNOSIS — Z7902 Long term (current) use of antithrombotics/antiplatelets: Secondary | ICD-10-CM | POA: Diagnosis not present

## 2021-12-24 DIAGNOSIS — L509 Urticaria, unspecified: Secondary | ICD-10-CM | POA: Diagnosis present

## 2021-12-24 LAB — CBC WITH DIFFERENTIAL/PLATELET
Abs Immature Granulocytes: 0.03 10*3/uL (ref 0.00–0.07)
Basophils Absolute: 0 10*3/uL (ref 0.0–0.1)
Basophils Relative: 0 %
Eosinophils Absolute: 0.1 10*3/uL (ref 0.0–0.5)
Eosinophils Relative: 2 %
HCT: 29.3 % — ABNORMAL LOW (ref 36.0–46.0)
Hemoglobin: 9.7 g/dL — ABNORMAL LOW (ref 12.0–15.0)
Immature Granulocytes: 0 %
Lymphocytes Relative: 13 %
Lymphs Abs: 1 10*3/uL (ref 0.7–4.0)
MCH: 29 pg (ref 26.0–34.0)
MCHC: 33.1 g/dL (ref 30.0–36.0)
MCV: 87.5 fL (ref 80.0–100.0)
Monocytes Absolute: 0.8 10*3/uL (ref 0.1–1.0)
Monocytes Relative: 11 %
Neutro Abs: 5.4 10*3/uL (ref 1.7–7.7)
Neutrophils Relative %: 74 %
Platelets: 275 10*3/uL (ref 150–400)
RBC: 3.35 MIL/uL — ABNORMAL LOW (ref 3.87–5.11)
RDW: 14.2 % (ref 11.5–15.5)
WBC: 7.3 10*3/uL (ref 4.0–10.5)
nRBC: 0 % (ref 0.0–0.2)

## 2021-12-24 LAB — BASIC METABOLIC PANEL
Anion gap: 10 (ref 5–15)
BUN: 14 mg/dL (ref 8–23)
CO2: 22 mmol/L (ref 22–32)
Calcium: 9.6 mg/dL (ref 8.9–10.3)
Chloride: 102 mmol/L (ref 98–111)
Creatinine, Ser: 1.18 mg/dL — ABNORMAL HIGH (ref 0.44–1.00)
GFR, Estimated: 47 mL/min — ABNORMAL LOW (ref >60)
Glucose, Bld: 80 mg/dL (ref 70–99)
Potassium: 4 mmol/L (ref 3.5–5.1)
Sodium: 134 mmol/L — ABNORMAL LOW (ref 135–145)

## 2021-12-24 MED ORDER — DIPHENHYDRAMINE HCL 50 MG/ML IJ SOLN
25.0000 mg | Freq: Once | INTRAMUSCULAR | Status: AC
  Administered 2021-12-24: 25 mg via INTRAVENOUS
  Filled 2021-12-24: qty 1

## 2021-12-24 MED ORDER — METHYLPREDNISOLONE SODIUM SUCC 125 MG IJ SOLR
125.0000 mg | Freq: Once | INTRAMUSCULAR | Status: AC
  Administered 2021-12-24: 125 mg via INTRAVENOUS
  Filled 2021-12-24: qty 2

## 2021-12-24 MED ORDER — CLONIDINE HCL 0.3 MG PO TABS: 0.3000 mg | ORAL_TABLET | Freq: Two times a day (BID) | ORAL | 1 refills | Status: AC

## 2021-12-24 MED ORDER — PREDNISONE 20 MG PO TABS: 40.0000 mg | ORAL_TABLET | Freq: Every day | ORAL | 0 refills | Status: DC

## 2021-12-24 NOTE — ED Provider Notes (Signed)
[facility] Provider Note   CSN: [id number] Arrival date & time: 12/24/21  0813     History  Chief Complaint  Patient presents with   Allergic Reaction    MIMIE GOERING is a 76 y.o. female.   Allergic Reaction  This patient is a very pleasant 77 year old female, she has a history of high cholesterol on Lipitor, hypertension on Coreg, clonidine, hydralazine and a history of diabetes currently taking Victoza.  This patient had a dialysis catheter that was removed on December 11 approximately 6 days ago, she is known to have renal artery stenosis, she underwent right renal artery catheter placement on December 04, 2021, pretty he is to that she had been on dialysis because of severe hypertensive nephropathy.  Since that time her kidney function has improved, the dialysis catheter was removed and she is now passing urine very easily, in fact a metabolic panel obtained 2 weeks ago showed her creatinine was down to 1.67 and her GFR was approximately 31.  She presents today with a complaint of hives and itching which she states has been going on for a couple of weeks.  She notes that it usually occurs after she takes her hydralazine, she states that it gradually gets better throughout the day but when she takes it again it gets worse.  She was placed on this a couple of weeks ago because of her severe hypertension.  She has not noted any significant blood pressure issues as she has been compliant with her medications but the hives have been getting worse and this morning she was terribly itchy which prompted her to call for 911 assistance.  The paramedics found the patient to be in no distress other than itching.  They gave 25 mg of diphenhydramine by IV route with improvement in the itching and the rash.  The patient states that is primarily on the arms and the legs and the back.  She does not have any lesions on the mouth the face the neck or the eyes.  She does not have  involvement of the palms or the soles.  She has not had fevers or any other systemic symptoms.  She has not been taking any medications for the itching at home prior to this    Home Medications Prior to Admission medications   Medication Sig Start Date End Date Taking? Authorizing Provider  predniSONE (DELTASONE) 20 MG tablet Take 2 tablets (40 mg total) by mouth daily. 12/24/21  Yes Noemi Chapel, MD  acetaminophen (TYLENOL) 500 MG tablet Take 1 tablet (500 mg total) by mouth every 6 (six) hours as needed. 12/09/21   Fransico Meadow, PA-C  aspirin EC 81 MG tablet Take 81 mg by mouth daily.    [provider]  atorvastatin (LIPITOR) 40 MG tablet Take 40 mg by mouth every evening.    [provider]  carvedilol (COREG) 25 MG tablet Take 1 tablet (25 mg total) by mouth 2 (two) times daily with a meal. 10/30/21   Fritzi Mandes, MD  cloNIDine (CATAPRES) 0.3 MG tablet Take 1 tablet (0.3 mg total) by mouth 2 (two) times daily. 12/24/21   Noemi Chapel, MD  clopidogrel (PLAVIX) 75 MG tablet Take 1 tablet (75 mg total) by mouth daily. 10/31/21   Fritzi Mandes, MD  famotidine (PEPCID) 40 MG tablet Take 40 mg by mouth at bedtime.    [provider]  fexofenadine (ALLEGRA) 180 MG tablet Take 180 mg by mouth daily as needed for  allergies.    [provider]  furosemide (LASIX) 40 MG tablet Take 1 tablet (40 mg total) by mouth daily. 10/31/21   Fritzi Mandes, MD  glimepiride (AMARYL) 4 MG tablet Take 2 mg by mouth 2 (two) times daily. 11/05/16   [provider]  liraglutide (VICTOZA) 18 MG/3ML SOPN Inject 1.8 mg into the skin every evening.    [provider]  NIFEdipine (ADALAT CC) 60 MG 24 hr tablet Take 1 tablet (60 mg total) by mouth daily. 10/31/21   Fritzi Mandes, MD  pioglitazone (ACTOS) 15 MG tablet Take 7.5 mg by mouth daily.    [provider]      Allergies    Patient has no known allergies.    Review of Systems   Review of Systems  All  other systems reviewed and are negative.   Physical Exam Updated Vital Signs BP (!) 169/62   Pulse 86   Temp 98.4 F (36.9 C) (Oral)   Resp 14   Ht 1.702 m (5\' 7" )   Wt 78 kg   SpO2 96%   BMI 26.93 kg/m  Physical Exam Vitals and nursing note reviewed.  Constitutional:      General: She is not in acute distress.    Appearance: She is well-developed.  HENT:     Head: Normocephalic and atraumatic.     Mouth/Throat:     Pharynx: No oropharyngeal exudate.  Eyes:     General: No scleral icterus.       Right eye: No discharge.        Left eye: No discharge.     Conjunctiva/sclera: Conjunctivae normal.     Pupils: Pupils are equal, round, and reactive to light.  Neck:     Thyroid: No thyromegaly.     Vascular: No JVD.  Cardiovascular:     Rate and Rhythm: Normal rate and regular rhythm.     Heart sounds: Normal heart sounds. No murmur heard.    No friction rub. No gallop.  Pulmonary:     Effort: Pulmonary effort is normal. No respiratory distress.     Breath sounds: Normal breath sounds. No wheezing or rales.  Abdominal:     General: Bowel sounds are normal. There is no distension.     Palpations: Abdomen is soft. There is no mass.     Tenderness: There is no abdominal tenderness.  Musculoskeletal:        General: No tenderness. Normal range of motion.     Cervical back: Normal range of motion and neck supple.  Lymphadenopathy:     Cervical: No cervical adenopathy.  Skin:    General: Skin is warm and dry.     Findings: Rash present. Lesion: .milm.    Comments: Multiple small urticarial lesions scattered across the back arms and legs, no petechiae or purpura, no vesicles or pustules, the lesions are blanching  Neurological:     Mental Status: She is alert.     Coordination: Coordination normal.  Psychiatric:        Behavior: Behavior normal.     ED Results / Procedures / Treatments   Labs (all labs ordered are listed, but only abnormal results are  displayed) Labs Reviewed  BASIC METABOLIC PANEL - Abnormal; Notable for the following components:      Result Value   Sodium 134 (*)    Creatinine, Ser 1.18 (*)    GFR, Estimated 47 (*)    All other components within normal limits  CBC  WITH DIFFERENTIAL/PLATELET - Abnormal; Notable for the following components:   RBC 3.35 (*)    Hemoglobin 9.7 (*)    HCT 29.3 (*)    All other components within normal limits    EKG None  Radiology No results found.  Procedures Procedures    Medications Ordered in ED Medications  methylPREDNISolone sodium succinate (SOLU-MEDROL) 125 mg/2 mL injection 125 mg (125 mg Intravenous Given 12/24/21 0837)  diphenhydrAMINE (BENADRYL) injection 25 mg (25 mg Intravenous Given 12/24/21 0836)    ED Course/ Medical Decision Making/ A&P Clinical Course as of 12/24/21 0940  Sun Dec 24, 2021  0918 I have reviewed the patient's labs and compared to prior labs.  The creatinine today is much better going down to 1.18, she had been as high as 3.2 a month ago.  Her hemoglobin is stable at 9.7 compared to prior labs.  There is no leukocytosis [BM]    Clinical Course User Index [BM] Noemi Chapel, MD                           Medical Decision Making Amount and/or Complexity of Data Reviewed Labs: ordered.  Risk Prescription drug management.   This patient presents to the ED for concern of rash and urticaria, this involves an extensive number of treatment options, and is a complaint that carries with it a high risk of complications and morbidity.  The differential diagnosis includes allergic reaction, likely to one of the new medications, hydralazine is the likely culprit given the timing of that medication and her symptoms   Co morbidities that complicate the patient evaluation  The patient does have diabetes which will make it difficult to use steroids, End-stage renal disease which has gradually improved and currently is not requiring dialysis Severe  hypertension likely related to renal artery stenosis, now stented   Additional history obtained:  Additional history obtained from electronic medical record External records from outside source obtained and reviewed including prior admissions to the hospital, prior catheterization report with stent placement, removal of catheter a couple of days ago   Lab Tests:  I Ordered, and personally interpreted labs.  The pertinent results include: CBC reviewed and unremarkable, mild chronic anemia, metabolic panel shows creatinine is improved compared to prior   Imaging Studies ordered:  Not indicated  Cardiac Monitoring: / EKG:  The patient was maintained on a cardiac monitor.  I personally viewed and interpreted the cardiac monitored which showed an underlying rhythm of: Normal sinus rhythm   Problem List / ED Course / Critical interventions / Medication management  Management of the patient's allergic reaction was given with another dose of diphenhydramine and Solu-Medrol.  She was observed on the monitor throughout, she continued to improve, we stopped the hydralazine, will need to alter her other medications to help with blood pressure control at home.  She has close follow-up with her doctor I ordered medication including clonidine 0.3 mg to be substituted for 0.2 mg for hypertension, blood pressure at discharge is approximately 169/62 and the patient is asymptomatic Reevaluation of the patient after these medicines showed that the patient improved itching and rash which is almost completely resolved with the second dose of Benadryl and a single dose of Solu-Medrol I have reviewed the patients home medicines and have made adjustments as needed   Social Determinants of Health:  Diabetic which complicates the use of steroids however the patient is aware of the potential complications At this time  the patient will be encouraged to use only antihistamines and stop the  hydralazine Prescription for prednisone given in case things get worse   Test / Admission - Considered:  Consider admission but the patient has no high risk features of anaphylaxis her kidney function is better, her blood pressure is controlled and at this time she appears stable for discharge, she understands indications for return.         Final Clinical Impression(s) / ED Diagnoses Final diagnoses:  Allergic reaction, initial encounter    Rx / DC Orders ED Discharge Orders          Ordered    cloNIDine (CATAPRES) 0.3 MG tablet  2 times daily        12/24/21 0933    predniSONE (DELTASONE) 20 MG tablet  Daily        12/24/21 0939              Noemi Chapel, MD 12/24/21 [phone]

## 2021-12-24 NOTE — ED Triage Notes (Signed)
PT arrived by [facility]. Pt c/o of having hives on all extremities, and lower back x2weeks. No airway issues noted. Pt recived 25mg  Benadryl in route by EMS. Pt sitting up on the side of the bed at this time in no distress. Dr.Miller at bedside.

## 2021-12-24 NOTE — Discharge Instructions (Signed)
It appears that you are having an allergic reaction to your blood pressure medication called hydralazine.  I would like for you to stop this medication immediately.  Because you are stopping this medication you will need to start taking additional medication to help control your blood pressure.  I would like for you to change the dose of the clonidine from 0.2 mg up to 0.3 mg and take this twice a day.  I have given you a new prescription for this medication which you can get filled today.  You will need to follow-up with your doctor within 2 or 3 days to have your blood pressure rechecked.  You to take Benadryl every 6 hours as needed if you are developing itching.  1 tablet per dose.  If you are having severe or worsening symptoms please take the prednisone once a day but be aware that this will cause your blood sugar to go up.  If none of this is helping and you are getting worse return to the emergency department immediately.  This means especially if you develop shortness of breath throat swelling of the throat vomiting or any worsening symptoms

## 2022-01-04 ENCOUNTER — Other Ambulatory Visit (INDEPENDENT_AMBULATORY_CARE_PROVIDER_SITE_OTHER): Payer: Self-pay | Admitting: Vascular Surgery

## 2022-01-04 DIAGNOSIS — I701 Atherosclerosis of renal artery: Secondary | ICD-10-CM

## 2022-01-04 DIAGNOSIS — Z959 Presence of cardiac and vascular implant and graft, unspecified: Secondary | ICD-10-CM

## 2022-01-05 ENCOUNTER — Ambulatory Visit (INDEPENDENT_AMBULATORY_CARE_PROVIDER_SITE_OTHER): Payer: Medicare Other

## 2022-01-05 ENCOUNTER — Encounter (INDEPENDENT_AMBULATORY_CARE_PROVIDER_SITE_OTHER): Payer: Self-pay | Admitting: Nurse Practitioner

## 2022-01-05 ENCOUNTER — Ambulatory Visit (INDEPENDENT_AMBULATORY_CARE_PROVIDER_SITE_OTHER): Payer: Medicare Other | Admitting: Nurse Practitioner

## 2022-01-05 VITALS — BP 140/81 | HR 81 | Resp 16

## 2022-01-05 DIAGNOSIS — Z959 Presence of cardiac and vascular implant and graft, unspecified: Secondary | ICD-10-CM

## 2022-01-05 DIAGNOSIS — R809 Proteinuria, unspecified: Secondary | ICD-10-CM

## 2022-01-05 DIAGNOSIS — I701 Atherosclerosis of renal artery: Secondary | ICD-10-CM | POA: Diagnosis not present

## 2022-01-05 DIAGNOSIS — I10 Essential (primary) hypertension: Secondary | ICD-10-CM

## 2022-01-05 DIAGNOSIS — E1129 Type 2 diabetes mellitus with other diabetic kidney complication: Secondary | ICD-10-CM

## 2022-01-05 NOTE — Patient Instructions (Signed)
Continue to take Plavix, Aspirin, and Statin

## 2022-01-07 ENCOUNTER — Encounter (INDEPENDENT_AMBULATORY_CARE_PROVIDER_SITE_OTHER): Payer: Self-pay | Admitting: Nurse Practitioner

## 2022-01-07 NOTE — Progress Notes (Signed)
Subjective:    Patient ID: Dawn Ford, female    DOB: [birth year], 76 y.o.   MRN: [id number] Chief Complaint  Patient presents with   Follow-up    Ultrasound follow up    Patient returns today in follow up of her renal stenosis.  She came in with acute kidney injury and underwent a left renal stent placement several weeks ago.  She developed worsening renal dysfunction and progressed to dialysis and she is still on dialysis at this time.  She had a known right renal artery stenosis as well but we do not treat both sides concomitantly.  She had some perinephric hematoma with her renal stent placement previously on the left.  She underwent placement of a right renal artery stent on 12/04/2021.  The patient has not had any difficulties or issues post stent placement.  She has also been removed from dialysis. 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     Today noninvasive studies show no evidence of renal artery stenosis bilaterally.  Both previously placed stents are open and patent.     Review of Systems  Neurological:  Positive for weakness.  All other systems reviewed and are negative.      Objective:   Physical Exam Vitals reviewed.  HENT:     Head: Normocephalic.  Cardiovascular:     Rate and Rhythm: Normal rate.  Pulmonary:     Effort: Pulmonary effort is normal.  Skin:    General: Skin is warm and dry.  Neurological:     Mental Status: She is alert and oriented to person, place, and time.  Psychiatric:        Mood and Affect: Mood normal.        Behavior: Behavior normal.        Thought Content: Thought content normal.        Judgment: Judgment normal.     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 b.) CTA abdomen/pelvis  02/13/2021: measured 2.9 x 2.7 cm.   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   a.) CTA abdomen/pelvis: measured 2.6 cm   GERD (gastroesophageal reflux disease)    Hyperlipemia    Hypertension    NSTEMI (non-ST elevated myocardial infarction) ([place]) 02/17/1999   a.) LHC 02/17/1999 --> 75% LAD and subtotal RCA with thrombus --> PCI performed placing stent (unknown type) to RCA   T2DM (type 2 diabetes mellitus) ([place])     Social History   Socioeconomic History   Marital status: Single    Spouse name: Not on file   Number of children: Not on file   Years of education: Not on file   Highest education level: Not on file  Occupational History   Not on file  Tobacco Use   Smoking status: Former    Types: Cigarettes    Quit date: 2000    Years since quitting: 24.0   Smokeless tobacco: Never  Vaping Use   Vaping Use: Never used  Substance and Sexual Activity   Alcohol use:  Yes    Comment: rarely   Drug use: No   Sexual activity: Not on file  Other Topics Concern   Not on file  Social History Narrative   Not on file   Social Determinants of Health   Financial Resource Strain: Not on file  Food Insecurity: No Food Insecurity (10/12/2021)   Hunger Vital Sign    Worried About Running Out of Food in the Last Year: Never true    Ran Out of Food in the Last Year: Never true  Transportation Needs: No Transportation Needs (10/12/2021)   PRAPARE - Hydrologist (Medical): No    Lack of Transportation (Non-Medical): No  Physical Activity: Not on file  Stress: Not on file  Social Connections: Not on file  Intimate Partner Violence: Not At Risk (10/12/2021)   Humiliation, Afraid, Rape, and Kick questionnaire    Fear of Current or Ex-Partner: No    Emotionally Abused: No     Physically Abused: No    Sexually Abused: No    Past Surgical History:  Procedure Laterality Date   CATARACT EXTRACTION, BILATERAL     COLONOSCOPY WITH PROPOFOL N/A 09/01/2018   Procedure: COLONOSCOPY WITH PROPOFOL;  Surgeon: Toledo, Benay Pike, MD;  Location: [facility];  Service: Gastroenterology;  Laterality: N/A;   CORONARY ANGIOPLASTY WITH STENT PLACEMENT     DIALYSIS/PERMA CATHETER INSERTION N/A 10/26/2021   Procedure: DIALYSIS/PERMA CATHETER INSERTION;  Surgeon: Algernon Huxley, MD;  Location: [place] [facility];  Service: Cardiovascular;  Laterality: N/A;   DIALYSIS/PERMA CATHETER REMOVAL N/A 12/18/2021   Procedure: DIALYSIS/PERMA CATHETER REMOVAL;  Surgeon: Algernon Huxley, MD;  Location: [place] [facility];  Service: Cardiovascular;  Laterality: N/A;   ENDOVASCULAR REPAIR/STENT GRAFT N/A 03/01/2021   Procedure: ENDOVASCULAR REPAIR/STENT GRAFT;  Surgeon: Algernon Huxley, MD;  Location: [place] [facility];  Service: Cardiovascular;  Laterality: N/A;   ESOPHAGOGASTRODUODENOSCOPY (EGD) WITH PROPOFOL N/A 03/13/2017   Procedure: ESOPHAGOGASTRODUODENOSCOPY (EGD) WITH PROPOFOL;  Surgeon: Virgel Manifold, MD;  Location: [facility];  Service: Endoscopy;  Laterality: N/A;   KNEE ARTHROSCOPY     RENAL ANGIOGRAPHY Bilateral 10/16/2021   Procedure: RENAL ANGIOGRAPHY;  Surgeon: Algernon Huxley, MD;  Location: [place] [facility];  Service: Cardiovascular;  Laterality: Bilateral;   RENAL ANGIOGRAPHY Right 12/04/2021   Procedure: RENAL ANGIOGRAPHY;  Surgeon: Algernon Huxley, MD;  Location: [place] [facility];  Service: Cardiovascular;  Laterality: Right;    Family History  Problem Relation Age of Onset   Breast cancer Other     No Known Allergies     Latest Ref Rng & Units 12/24/2021    8:36 AM 12/09/2021    9:23 AM 10/29/2021    4:44 AM  CBC  WBC 4.0 - 10.5 K/uL 7.3  9.5  10.3   Hemoglobin 12.0 - 15.0 g/dL 9.7  10.0  7.3   Hematocrit 36.0 - 46.0 % 29.3  29.7  21.6   Platelets  150 - 400 K/uL 275  296  195       CMP     Component Value Date/Time   NA 134 (L) 12/24/2021 0836   K 4.0 12/24/2021 0836   K 3.8 10/07/2012 1128   CL 102 12/24/2021 0836   CO2 22 12/24/2021 0836   GLUCOSE 80 12/24/2021 0836   BUN 14 12/24/2021 0836   CREATININE 1.18 (H) 12/24/2021 0836   CALCIUM 9.6 12/24/2021 0836   PROT 7.8 12/09/2021 1610  ALBUMIN 3.8 12/09/2021 0923   AST 33 12/09/2021 0923   ALT 17 12/09/2021 0923   ALKPHOS 68 12/09/2021 0923   BILITOT 1.0 12/09/2021 0923   GFRNONAA 47 (L) 12/24/2021 0836   GFRAA >60 12/13/2016 0834     No results found.     Assessment & Plan:   1. Renal artery stenosis (HCC) Recommend:  BP today was acceptable Given patient's atherosclerosis and PAD optimal control of the patient's hypertension is important.  The patient's BP and noninvasive studies support the previous intervention is patent. No further intervention is indicated at this time.  Therefore the patient  will continue the current medications, no changes at this time.  The primary medical service will continue aggressive antihypertensive therapy as per the AHA guidelines   2. Benign essential hypertension Continue antihypertensive medications as already ordered, these medications have been reviewed and there are no changes at this time.  3. Type 2 diabetes mellitus with microalbuminuria, without long-term current use of insulin (HCC) Continue hypoglycemic medications as already ordered, these medications have been reviewed and there are no changes at this time.  Hgb A1C to be monitored as already arranged by primary service   Current Outpatient Medications on File Prior to Visit  Medication Sig Dispense Refill   acetaminophen (TYLENOL) 500 MG tablet Take 1 tablet (500 mg total) by mouth every 6 (six) hours as needed. (Patient not taking: Reported on 01/05/2022) 30 tablet 0   aspirin EC 81 MG tablet Take 81 mg by mouth daily. (Patient not taking: Reported  on 01/05/2022)     atorvastatin (LIPITOR) 40 MG tablet Take 40 mg by mouth every evening. (Patient not taking: Reported on 01/05/2022)     carvedilol (COREG) 25 MG tablet Take 1 tablet (25 mg total) by mouth 2 (two) times daily with a meal. (Patient not taking: Reported on 01/05/2022) 60 tablet 2   cloNIDine (CATAPRES) 0.3 MG tablet Take 1 tablet (0.3 mg total) by mouth 2 (two) times daily. (Patient not taking: Reported on 01/05/2022) 30 tablet 1   clopidogrel (PLAVIX) 75 MG tablet Take 1 tablet (75 mg total) by mouth daily. (Patient not taking: Reported on 01/05/2022) 30 tablet 2   famotidine (PEPCID) 40 MG tablet Take 40 mg by mouth at bedtime. (Patient not taking: Reported on 01/05/2022)     fexofenadine (ALLEGRA) 180 MG tablet Take 180 mg by mouth daily as needed for allergies. (Patient not taking: Reported on 01/05/2022)     furosemide (LASIX) 40 MG tablet Take 1 tablet (40 mg total) by mouth daily. (Patient not taking: Reported on 01/05/2022) 30 tablet 2   glimepiride (AMARYL) 4 MG tablet Take 2 mg by mouth 2 (two) times daily. (Patient not taking: Reported on 01/05/2022)     liraglutide (VICTOZA) 18 MG/3ML SOPN Inject 1.8 mg into the skin every evening. (Patient not taking: Reported on 01/05/2022)     NIFEdipine (ADALAT CC) 60 MG 24 hr tablet Take 1 tablet (60 mg total) by mouth daily. (Patient not taking: Reported on 01/05/2022) 30 tablet 2   pioglitazone (ACTOS) 15 MG tablet Take 7.5 mg by mouth daily. (Patient not taking: Reported on 01/05/2022)     predniSONE (DELTASONE) 20 MG tablet Take 2 tablets (40 mg total) by mouth daily. (Patient not taking: Reported on 01/05/2022) 10 tablet 0   No current facility-administered medications on file prior to visit.    Patient Instructions  Continue to take Plavix, Aspirin, and Statin  No follow-ups on file.  Kris Hartmann, NP

## 2022-03-26 ENCOUNTER — Other Ambulatory Visit (INDEPENDENT_AMBULATORY_CARE_PROVIDER_SITE_OTHER): Payer: Self-pay | Admitting: Nurse Practitioner

## 2022-03-26 DIAGNOSIS — I701 Atherosclerosis of renal artery: Secondary | ICD-10-CM

## 2022-03-27 ENCOUNTER — Ambulatory Visit (INDEPENDENT_AMBULATORY_CARE_PROVIDER_SITE_OTHER): Payer: Medicare Other | Admitting: Vascular Surgery

## 2022-03-27 ENCOUNTER — Encounter (INDEPENDENT_AMBULATORY_CARE_PROVIDER_SITE_OTHER): Payer: Self-pay | Admitting: Vascular Surgery

## 2022-03-27 ENCOUNTER — Ambulatory Visit (INDEPENDENT_AMBULATORY_CARE_PROVIDER_SITE_OTHER): Payer: Medicare Other

## 2022-03-27 VITALS — BP 109/62 | HR 70 | Resp 16 | Wt 136.8 lb

## 2022-03-27 DIAGNOSIS — I169 Hypertensive crisis, unspecified: Secondary | ICD-10-CM | POA: Diagnosis not present

## 2022-03-27 DIAGNOSIS — I723 Aneurysm of iliac artery: Secondary | ICD-10-CM | POA: Diagnosis not present

## 2022-03-27 DIAGNOSIS — I701 Atherosclerosis of renal artery: Secondary | ICD-10-CM | POA: Diagnosis not present

## 2022-03-27 DIAGNOSIS — K551 Chronic vascular disorders of intestine: Secondary | ICD-10-CM | POA: Insufficient documentation

## 2022-03-27 DIAGNOSIS — E1122 Type 2 diabetes mellitus with diabetic chronic kidney disease: Secondary | ICD-10-CM

## 2022-03-27 DIAGNOSIS — E785 Hyperlipidemia, unspecified: Secondary | ICD-10-CM

## 2022-03-27 NOTE — Assessment & Plan Note (Signed)
Stent graft appears patent.  Maximal aortic diameter measured only 2.8 cm.  Will be following her renal and mesenteric duplex in the future and can generally see her iliac stent graft at that time.

## 2022-03-27 NOTE — Assessment & Plan Note (Signed)
Duplex today shows both renal artery stents to be patent without hemodynamically significant stenosis.  She has now come off of dialysis and seems to be doing well.  Her blood pressure is markedly improved.  Recheck renal artery duplex in 6 months.  No change in medical regimen.

## 2022-03-27 NOTE — Progress Notes (Signed)
MRN : [id number]  Dawn Ford is a 77 y.o. ([birth year]) female who presents with chief complaint of  Chief Complaint  Patient presents with   Follow-up    Ultrasound follow up  .  History of Present Illness: Patient returns today in follow up.  She is doing well.  She has come off dialysis.  She is not having postprandial abdominal pain, food fear, or weight loss.  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  No symptoms of her aneurysm. Specifically, the patient denies new back or abdominal pain, or signs of peripheral embolization. Duplex today shows both renal artery stents to be patent without hemodynamically significant stenosis.  Incidentally, duplex today shows elevated velocities in her superior mesenteric artery consistent with >70% stenosis.  Her aortic diameter measured 2.81 cm in maximal diameter.  Her previous iliac stent graft appears to be widely patent.  Current Outpatient Medications  Medication Sig Dispense Refill   acetaminophen (TYLENOL) 500 MG tablet Take 1 tablet (500 mg total) by mouth every 6 (six) hours as needed. 30 tablet 0   aspirin EC 81 MG tablet Take 81 mg by mouth daily.     atorvastatin (LIPITOR) 40 MG tablet Take 40 mg by mouth every evening.     carvedilol (COREG) 25 MG tablet Take 1 tablet (25 mg total) by mouth 2 (two) times daily with a meal. 60 tablet 2   cloNIDine (CATAPRES) 0.3 MG tablet Take 1 tablet (0.3 mg total) by mouth 2 (two) times daily. 30 tablet 1   clopidogrel (PLAVIX) 75 MG tablet Take 1 tablet (75 mg total) by mouth daily. 30 tablet 2   glimepiride (AMARYL) 4 MG tablet Take 2 mg by mouth 2 (two) times daily.     NIFEdipine (ADALAT CC) 60 MG 24 hr tablet Take 1 tablet (60 mg total) by mouth daily. 30 tablet 2   pioglitazone (ACTOS) 15 MG tablet Take 7.5 mg by mouth daily.     famotidine (PEPCID) 40 MG tablet Take 40 mg by mouth at bedtime. (Patient not taking: Reported on 01/05/2022)     fexofenadine  (ALLEGRA) 180 MG tablet Take 180 mg by mouth daily as needed for allergies. (Patient not taking: Reported on 01/05/2022)     furosemide (LASIX) 40 MG tablet Take 1 tablet (40 mg total) by mouth daily. (Patient not taking: Reported on 01/05/2022) 30 tablet 2   liraglutide (VICTOZA) 18 MG/3ML SOPN Inject 1.8 mg into the skin every evening. (Patient not taking: Reported on 01/05/2022)     predniSONE (DELTASONE) 20 MG tablet Take 2 tablets (40 mg total) by mouth daily. (Patient not taking: Reported on 01/05/2022) 10 tablet 0   No current facility-administered medications for this visit.    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 b.) CTA abdomen/pelvis 02/13/2021: measured 2.9 x 2.7 cm.   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   a.) CTA abdomen/pelvis: measured 2.6 cm   GERD (gastroesophageal reflux disease)    Hyperlipemia    Hypertension    NSTEMI (non-ST elevated myocardial infarction) ([place]) 02/17/1999   a.) LHC 02/17/1999 --> 75% LAD and subtotal RCA  with thrombus --> PCI performed placing stent (unknown type) to RCA   T2DM (type 2 diabetes mellitus) ([place])     Past Surgical History:  Procedure Laterality Date   CATARACT EXTRACTION, BILATERAL     COLONOSCOPY WITH PROPOFOL N/A 09/01/2018   Procedure: COLONOSCOPY WITH PROPOFOL;  Surgeon: Toledo, Benay Pike, MD;  Location: [facility];  Service: Gastroenterology;  Laterality: N/A;   CORONARY ANGIOPLASTY WITH STENT PLACEMENT     DIALYSIS/PERMA CATHETER INSERTION N/A 10/26/2021   Procedure: DIALYSIS/PERMA CATHETER INSERTION;  Surgeon: Algernon Huxley, MD;  Location: [place] [facility];  Service: Cardiovascular;  Laterality: N/A;   DIALYSIS/PERMA CATHETER REMOVAL  N/A 12/18/2021   Procedure: DIALYSIS/PERMA CATHETER REMOVAL;  Surgeon: Algernon Huxley, MD;  Location: [place] [facility];  Service: Cardiovascular;  Laterality: N/A;   ENDOVASCULAR REPAIR/STENT GRAFT N/A 03/01/2021   Procedure: ENDOVASCULAR REPAIR/STENT GRAFT;  Surgeon: Algernon Huxley, MD;  Location: [place] [facility];  Service: Cardiovascular;  Laterality: N/A;   ESOPHAGOGASTRODUODENOSCOPY (EGD) WITH PROPOFOL N/A 03/13/2017   Procedure: ESOPHAGOGASTRODUODENOSCOPY (EGD) WITH PROPOFOL;  Surgeon: Virgel Manifold, MD;  Location: [facility];  Service: Endoscopy;  Laterality: N/A;   KNEE ARTHROSCOPY     RENAL ANGIOGRAPHY Bilateral 10/16/2021   Procedure: RENAL ANGIOGRAPHY;  Surgeon: Algernon Huxley, MD;  Location: [place] [facility];  Service: Cardiovascular;  Laterality: Bilateral;   RENAL ANGIOGRAPHY Right 12/04/2021   Procedure: RENAL ANGIOGRAPHY;  Surgeon: Algernon Huxley, MD;  Location: [place] [facility];  Service: Cardiovascular;  Laterality: Right;     Social History   Tobacco Use   Smoking status: Former    Types: Cigarettes    Quit date: 2000    Years since quitting: 24.2   Smokeless tobacco: Never  Vaping Use   Vaping Use: Never used  Substance Use Topics   Alcohol use: Yes    Comment: rarely   Drug use: No      Family History  Problem Relation Age of Onset   Breast cancer Other      No Known Allergies   REVIEW OF SYSTEMS (Negative unless checked)   Constitutional: [] Weight loss  [] Fever  [] Chills Cardiac: [] Chest pain   [] Chest pressure   [] Palpitations   [] Shortness of breath when laying flat   [] Shortness of breath at rest   [] Shortness of breath with exertion. Vascular:  [] Pain in legs with walking   [] Pain in legs at rest   [] Pain in legs when laying flat   [] Claudication   [] Pain in feet when walking  [] Pain in feet at rest  [] Pain in feet when laying flat   [] History of DVT   [] Phlebitis   [x] Swelling in legs   [] Varicose veins   [] Non-healing  ulcers Pulmonary:   [] Uses home oxygen   [] Productive cough   [] Hemoptysis   [] Wheeze  [] COPD   [] Asthma Neurologic:  [] Dizziness  [] Blackouts   [] Seizures   [] History of stroke   [] History of TIA  [] Aphasia   [] Temporary blindness   [] Dysphagia   [] Weakness or numbness in arms   [] Weakness or numbness in legs Musculoskeletal:  [x] Arthritis   [] Joint swelling   [] Joint pain   [] Low back pain Hematologic:  [] Easy bruising  [] Easy bleeding   [] Hypercoagulable state   [x] Anemic   Gastrointestinal:  [] Blood in stool   [] Vomiting blood  [] Gastroesophageal reflux/heartburn   [] Abdominal pain Genitourinary:  [x] Chronic kidney disease   [] Difficult urination  [] Frequent urination  [] Burning with urination   []   Hematuria Skin:  [] Rashes   [] Ulcers   [] Wounds Psychological:  [] History of anxiety   []  History of major depression.   Physical Examination  BP 109/62 (BP Location: Left Arm)   Pulse 70   Resp 16   Wt 136 lb 12.8 oz (62.1 kg)   BMI 21.43 kg/m  Gen:  WD/WN, NAD. Appears younger than stated age. Head: Power/AT, No temporalis wasting. Ear/Nose/Throat: Hearing grossly intact, nares w/o erythema or drainage Eyes: Conjunctiva clear. Sclera non-icteric Neck: Supple.  Trachea midline Pulmonary:  Good air movement, no use of accessory muscles.  Cardiac: RRR, no JVD Vascular:  Vessel Right Left  Radial Palpable Palpable                                   Gastrointestinal: soft, non-tender/non-distended. No guarding/reflex.  Musculoskeletal: M/S 5/5 throughout.  No deformity or atrophy. No edema. Neurologic: Sensation grossly intact in extremities.  Symmetrical.  Speech is fluent.  Psychiatric: Judgment intact, Mood & affect appropriate for pt's clinical situation. Dermatologic: No rashes or ulcers noted.  No cellulitis or open wounds.      Labs No results found for this or any previous visit (from the past 2160 hour(s)).  Radiology No results found.  Assessment/Plan    Hypertensive crisis With bilateral renal artery stenosis.  Has undergone bilateral renal artery interventions.    Type II diabetes mellitus with renal manifestations (HCC) blood glucose control important in reducing the progression of atherosclerotic disease. Also, involved in wound healing. On appropriate medications.  Renal artery stenosis (HCC) Duplex today shows both renal artery stents to be patent without hemodynamically significant stenosis.  She has now come off of dialysis and seems to be doing well.  Her blood pressure is markedly improved.  Recheck renal artery duplex in 6 months.  No change in medical regimen.  Iliac artery aneurysm ([place]) Stent graft appears patent.  Maximal aortic diameter measured only 2.8 cm.  Will be following her renal and mesenteric duplex in the future and can generally see her iliac stent graft at that time.  Superior mesenteric artery stenosis (HCC) Duplex today shows elevated velocities in her superior mesenteric artery consistent with >70% stenosis.  This was a relatively incidental finding and she does not have any worrisome symptoms postprandial abdominal pain, food fear, or unintentional weight loss.  No intervention is planned at this time.  This can be seen with her regular duplex follow-up for renal arteries in 6 months.    [name], MD  03/27/2022 11:19 AM    This note was created with Dragon medical transcription system.  Any errors from dictation are purely unintentional

## 2022-03-27 NOTE — Assessment & Plan Note (Signed)
Duplex today shows elevated velocities in her superior mesenteric artery consistent with >70% stenosis.  This was a relatively incidental finding and she does not have any worrisome symptoms postprandial abdominal pain, food fear, or unintentional weight loss.  No intervention is planned at this time.  This can be seen with her regular duplex follow-up for renal arteries in 6 months.

## 2022-04-19 ENCOUNTER — Encounter (HOSPITAL_COMMUNITY): Payer: Self-pay

## 2022-04-19 ENCOUNTER — Emergency Department (HOSPITAL_COMMUNITY)
Admission: EM | Admit: 2022-04-19 | Discharge: 2022-04-19 | Disposition: A | Discharge status: Home / Self Care | Payer: Medicare Other | Attending: Emergency Medicine | Admitting: Emergency Medicine

## 2022-04-19 ENCOUNTER — Emergency Department (HOSPITAL_COMMUNITY): Payer: Medicare Other

## 2022-04-19 ENCOUNTER — Other Ambulatory Visit: Payer: Self-pay

## 2022-04-19 DIAGNOSIS — S0990XA Unspecified injury of head, initial encounter: Secondary | ICD-10-CM

## 2022-04-19 DIAGNOSIS — Z23 Encounter for immunization: Secondary | ICD-10-CM | POA: Insufficient documentation

## 2022-04-19 DIAGNOSIS — Z7982 Long term (current) use of aspirin: Secondary | ICD-10-CM | POA: Insufficient documentation

## 2022-04-19 DIAGNOSIS — S0083XA Contusion of other part of head, initial encounter: Secondary | ICD-10-CM | POA: Diagnosis not present

## 2022-04-19 DIAGNOSIS — I2510 Atherosclerotic heart disease of native coronary artery without angina pectoris: Secondary | ICD-10-CM | POA: Diagnosis not present

## 2022-04-19 DIAGNOSIS — Z7902 Long term (current) use of antithrombotics/antiplatelets: Secondary | ICD-10-CM | POA: Diagnosis not present

## 2022-04-19 DIAGNOSIS — W01198A Fall on same level from slipping, tripping and stumbling with subsequent striking against other object, initial encounter: Secondary | ICD-10-CM | POA: Diagnosis not present

## 2022-04-19 MED ORDER — TETANUS-DIPHTH-ACELL PERTUSSIS 5-2.5-18.5 LF-MCG/0.5 IM SUSY
0.5000 mL | PREFILLED_SYRINGE | Freq: Once | INTRAMUSCULAR | Status: AC
  Administered 2022-04-19: 0.5 mL via INTRAMUSCULAR
  Filled 2022-04-19: qty 0.5

## 2022-04-19 NOTE — ED Triage Notes (Signed)
Pt fell yesterday and hit her head, small bruised knot on the left side of head. Went to urgent care today and they suggested she come here. Denies pain anywhere else.

## 2022-04-19 NOTE — Discharge Instructions (Signed)
You were seen for head injury in the emergency department.  Your CT scans did not show any traumatic brain injury or neck injury.  At home, please take Tylenol for your pain and ice your forehead to limit swelling.    Follow-up with your primary doctor in 2-3 days regarding your visit.    Return immediately to the emergency department if you experience any of the following: Severe headache, vomiting, or any other concerning symptoms.    Thank you for visiting our Emergency Department. It was a pleasure taking care of you today.

## 2022-04-19 NOTE — ED Triage Notes (Signed)
Pt reports she takes a daily ASA but no other blood thinners.

## 2022-04-19 NOTE — ED Provider Notes (Signed)
[place] [facility]GENC[facility] DEPARTMENT AT [facility] Provider Note   CSN: [id number] Arrival date & time: 04/19/22  1444     History  Chief Complaint  Patient presents with   Denny PeonFall    Dawn Ford is a 77 y.o. female.  77 year old female with a history of CAD status post PCI on aspirin who presented to the emergency department with fall.  Patient reports that she was walking on the back porch when she tripped over a nail that was sticking up from the deck and hit her head on a metal dolly that was on the back porch.  No LOC.  Did have a hematoma formed on her left forehead.  No nausea or vomiting or significant headache afterwards.  No severe neck pain.  Unsure of last Tdap.       Home Medications Prior to Admission medications   Medication Sig Start Date End Date Taking? Authorizing Provider  acetaminophen (TYLENOL) 500 MG tablet Take 1 tablet (500 mg total) by mouth every 6 (six) hours as needed. 12/09/21   Elson AreasSofia, Leslie K, PA-C  aspirin EC 81 MG tablet Take 81 mg by mouth daily.    [provider]  atorvastatin (LIPITOR) 40 MG tablet Take 40 mg by mouth every evening.    [provider]  carvedilol (COREG) 25 MG tablet Take 1 tablet (25 mg total) by mouth 2 (two) times daily with a meal. 10/30/21   Enedina FinnerPatel, Sona, MD  cloNIDine (CATAPRES) 0.3 MG tablet Take 1 tablet (0.3 mg total) by mouth 2 (two) times daily. 12/24/21   Eber HongMiller, Brian, MD  clopidogrel (PLAVIX) 75 MG tablet Take 1 tablet (75 mg total) by mouth daily. 10/31/21   Enedina FinnerPatel, Sona, MD  famotidine (PEPCID) 40 MG tablet Take 40 mg by mouth at bedtime. Patient not taking: Reported on 01/05/2022    [provider]  fexofenadine (ALLEGRA) 180 MG tablet Take 180 mg by mouth daily as needed for allergies. Patient not taking: Reported on 01/05/2022    [provider]  furosemide (LASIX) 40 MG tablet Take 1 tablet (40 mg total) by mouth daily. Patient not taking: Reported on 01/05/2022  10/31/21   Enedina FinnerPatel, Sona, MD  glimepiride (AMARYL) 4 MG tablet Take 2 mg by mouth 2 (two) times daily. 11/05/16   [provider]  liraglutide (VICTOZA) 18 MG/3ML SOPN Inject 1.8 mg into the skin every evening. Patient not taking: Reported on 01/05/2022    [provider]  NIFEdipine (ADALAT CC) 60 MG 24 hr tablet Take 1 tablet (60 mg total) by mouth daily. 10/31/21   Enedina FinnerPatel, Sona, MD  pioglitazone (ACTOS) 15 MG tablet Take 7.5 mg by mouth daily.    [provider]  predniSONE (DELTASONE) 20 MG tablet Take 2 tablets (40 mg total) by mouth daily. Patient not taking: Reported on 01/05/2022 12/24/21   Eber HongMiller, Brian, MD      Allergies    Patient has no known allergies.    Review of Systems   Review of Systems  Physical Exam Updated Vital Signs BP 117/65 (BP Location: Right Arm)   Pulse 77   Temp 98.1 F (36.7 C) (Oral)   Resp 14   SpO2 99%  Physical Exam Constitutional:      General: She is not in acute distress.    Appearance: Normal appearance. She is not ill-appearing.  HENT:     Head: Normocephalic.     Comments: Hematoma to the left forehead with overlying abrasion  Right Ear: External ear normal.     Left Ear: External ear normal.     Mouth/Throat:     Mouth: Mucous membranes are moist.     Pharynx: Oropharynx is clear.  Eyes:     Extraocular Movements: Extraocular movements intact.     Conjunctiva/sclera: Conjunctivae normal.     Pupils: Pupils are equal, round, and reactive to light.  Neck:     Comments: No C-spine midline tenderness to palpation Pulmonary:     Effort: Pulmonary effort is normal. No respiratory distress.  Musculoskeletal:        General: No deformity. Normal range of motion.     Cervical back: No rigidity or tenderness.  Neurological:     General: No focal deficit present.     Mental Status: She is alert and oriented to person, place, and time. Mental status is at baseline.     Sensory: No sensory deficit.     Motor: No  weakness.     ED Results / Procedures / Treatments   Labs (all labs ordered are listed, but only abnormal results are displayed) Labs Reviewed - No data to display  EKG None  Radiology CT Cervical Spine Wo Contrast  Result Date: 04/19/2022 CLINICAL DATA:  Trauma EXAM: CT CERVICAL SPINE WITHOUT CONTRAST TECHNIQUE: Multidetector CT imaging of the cervical spine was performed without intravenous contrast. Multiplanar CT image reconstructions were also generated. RADIATION DOSE REDUCTION: This exam was performed according to the departmental dose-optimization program which includes automated exposure control, adjustment of the mA and/or kV according to patient size and/or use of iterative reconstruction technique. COMPARISON:  None Available. FINDINGS: Alignment: Alignment of posterior margins of vertebral bodies is within normal limits. Skull base and vertebrae: No recent fracture is seen. Small smoothly marginated calcification at the tip of spinous process of C7 vertebra may be residual from previous ligament injury. Degenerative changes are noted, more so at C3-C4 and C5-C6 levels. Soft tissues and spinal canal: There is no central spinal stenosis. Posterior bony spurs are causing extrinsic pressure over the ventral margin of thecal sac at C3-C4 and C5-C6 levels. Disc levels: There is encroachment of neural foramina my bony spurs from C3 to C7 levels. Upper chest: Unremarkable. Other: Scattered arterial calcifications are seen. IMPRESSION: No recent fracture is seen in cervical spine. Cervical spondylosis with encroachment of neural foramina by bony spurs at multiple levels. Electronically Signed   By: Ernie Avena M.D.   On: 04/19/2022 16:38   CT Head Wo Contrast  Result Date: 04/19/2022 CLINICAL DATA:  Trauma, fall EXAM: CT HEAD WITHOUT CONTRAST TECHNIQUE: Contiguous axial images were obtained from the base of the skull through the vertex without intravenous contrast. RADIATION DOSE  REDUCTION: This exam was performed according to the departmental dose-optimization program which includes automated exposure control, adjustment of the mA and/or kV according to patient size and/or use of iterative reconstruction technique. COMPARISON:  11/19/2017 FINDINGS: Brain: No acute intracranial findings are seen. There are no signs of bleeding within the cranium. Ventricles are not dilated. There is no focal edema or mass effect. Vascular: Unremarkable. Skull: No fracture is seen in calvarium. There is subcutaneous contusion/hematoma in the left frontotemporal scalp. Sinuses/Orbits: No acute findings are seen. Other: None. IMPRESSION: No acute intracranial findings are seen. There is subcutaneous contusion/hematoma in the left frontotemporal scalp. No fracture is seen in calvarium. Electronically Signed   By: Ernie Avena M.D.   On: 04/19/2022 16:35    Procedures Procedures   Medications Ordered  in ED Medications  Tdap (BOOSTRIX) injection 0.5 mL (0.5 mLs Intramuscular Given 04/19/22 1704)    ED Course/ Medical Decision Making/ A&P                             Medical Decision Making Risk Prescription drug management.   Dawn Ford is a 77 y.o. female with comorbidities that complicate the patient evaluation including CAD on aspirin who presented with head trauma after a fall   Initial Ddx:  TBI, C-spine injury, concussion  MDM:  The patient may have a minor head injury given her symptoms.  Appears to have resulted from a mechanical fall.  Is on aspirin and given her age we will obtain a CT of the head and C-spine to rule out TBI and C-spine injury.  No signs of concussion at this time.  Unsure of last tetanus shot and does have abrasions so we will update at this time.  Plan:  CT head CT C-spine Tetanus update  ED Summary/Re-evaluation:  Patient main stable in the emergency department.  Had head CT and C-spine CT that did not show any acute injuries.  Her tetanus was  updated.  Will have her follow-up with her primary doctor in 2 to 3 days.  This patient presents to the ED for concern of complaints listed in HPI, this involves an extensive number of treatment options, and is a complaint that carries with it a high risk of complications and morbidity. Disposition including potential need for admission considered.   Dispo: DC Home. Return precautions discussed including, but not limited to, those listed in the AVS. Allowed pt time to ask questions which were answered fully prior to dc.  Additional history obtained from  friend Records reviewed Outpatient Clinic Notes I independently reviewed the following imaging with scope of interpretation limited to determining acute life threatening conditions related to emergency care: CT Head and agree with the radiologist interpretation with the following exceptions: none I personally reviewed and interpreted cardiac monitoring: normal sinus rhythm  I personally reviewed and interpreted the pt's EKG: see above for interpretation  I have reviewed the patients home medications and made adjustments as needed Social Determinants of health:  Elderly   Final Clinical Impression(s) / ED Diagnoses Final diagnoses:  Minor head injury, initial encounter    Rx / DC Orders ED Discharge Orders     None         Rondel Baton, MD 04/19/22 1719

## 2022-06-15 ENCOUNTER — Other Ambulatory Visit: Payer: Self-pay | Admitting: Internal Medicine

## 2022-06-15 DIAGNOSIS — Z1231 Encounter for screening mammogram for malignant neoplasm of breast: Secondary | ICD-10-CM

## 2022-07-26 ENCOUNTER — Ambulatory Visit
Admission: RE | Admit: 2022-07-26 | Discharge: 2022-07-26 | Disposition: A | Discharge status: Home / Self Care | Payer: Medicare Other | Source: Ambulatory Visit | Attending: Internal Medicine | Admitting: Internal Medicine

## 2022-07-26 DIAGNOSIS — Z1231 Encounter for screening mammogram for malignant neoplasm of breast: Secondary | ICD-10-CM | POA: Diagnosis not present

## 2022-07-27 ENCOUNTER — Telehealth (INDEPENDENT_AMBULATORY_CARE_PROVIDER_SITE_OTHER): Payer: Self-pay

## 2022-07-27 NOTE — Telephone Encounter (Signed)
She can come in with no studies but due to provider availability next week may be difficult getting her in

## 2022-08-03 ENCOUNTER — Ambulatory Visit (INDEPENDENT_AMBULATORY_CARE_PROVIDER_SITE_OTHER): Payer: Medicare Other | Admitting: Vascular Surgery

## 2022-08-07 ENCOUNTER — Ambulatory Visit (INDEPENDENT_AMBULATORY_CARE_PROVIDER_SITE_OTHER): Payer: Medicare Other | Admitting: Nurse Practitioner

## 2022-08-07 ENCOUNTER — Encounter (INDEPENDENT_AMBULATORY_CARE_PROVIDER_SITE_OTHER): Payer: Self-pay | Admitting: Nurse Practitioner

## 2022-08-07 VITALS — BP 117/62 | HR 62 | Resp 18 | Ht 66.0 in | Wt 148.0 lb

## 2022-08-07 DIAGNOSIS — M7989 Other specified soft tissue disorders: Secondary | ICD-10-CM

## 2022-08-07 DIAGNOSIS — E1129 Type 2 diabetes mellitus with other diabetic kidney complication: Secondary | ICD-10-CM

## 2022-08-07 DIAGNOSIS — R809 Proteinuria, unspecified: Secondary | ICD-10-CM

## 2022-08-07 DIAGNOSIS — I701 Atherosclerosis of renal artery: Secondary | ICD-10-CM | POA: Diagnosis not present

## 2022-08-07 DIAGNOSIS — K551 Chronic vascular disorders of intestine: Secondary | ICD-10-CM | POA: Diagnosis not present

## 2022-08-08 NOTE — Progress Notes (Signed)
Subjective:    Patient ID: Dawn Ford, female    DOB: 06/26/1945, 77 y.o.   MRN: [id number] Chief Complaint  Patient presents with   Follow-up    [facility]. Per FB She can come in with no studies    This is a 77 year old female that presents today due to concern for lower extremity edema as well as pain in her side.  She notes that the pain in her side happens when she wakes up in the morning and that is gone for the rest of the day.  She notes that today she actually has not had the pain.  She denies that the pain is exacerbated by eating.  She denies any postprandial pain.  She does not have any abdominal pain at all currently.  She denies any nausea or vomiting either.  The patient has a history of bilateral renal artery stent placement and had an acute kidney injury for short time which required dialysis.  Since that time her kidney function has largely improved.  The larger concerning symptoms with the swelling she is having in her lower extremities.  She notes that her legs are elevated she does not have any issues but as soon as she puts them on the floor she begins to have issues with swelling.  Currently the patient does not utilize medical grade compression.  She also notes that she has not been as active recently.  Currently there are no open wounds or ulcerations.  No signs symptoms of cellulitis.  She denies claudication-like symptoms.    Review of Systems  Cardiovascular:  Positive for leg swelling.  Musculoskeletal:  Positive for back pain.  All other systems reviewed and are negative.      Objective:   Physical Exam Vitals reviewed.  HENT:     Head: Normocephalic.  Cardiovascular:     Rate and Rhythm: Normal rate.     Pulses:          Dorsalis pedis pulses are 1+ on the right side and 1+ on the left side.  Pulmonary:     Effort: Pulmonary effort is normal.  Musculoskeletal:     Right lower leg: 1+ Edema present.     Left lower leg: 1+ Edema present.  Skin:     General: Skin is warm and dry.  Neurological:     Mental Status: She is alert and oriented to person, place, and time.  Psychiatric:        Mood and Affect: Mood normal.        Behavior: Behavior normal.        Thought Content: Thought content normal.        Judgment: Judgment normal.     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 b.) CTA abdomen/pelvis 02/13/2021: measured 2.9 x 2.7 cm.   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   a.) CTA abdomen/pelvis: measured 2.6 cm   GERD (gastroesophageal reflux disease)    Hyperlipemia  Hypertension    NSTEMI (non-ST elevated myocardial infarction) (HCC) 02/17/1999   a.) LHC 02/17/1999 --> 75% LAD and subtotal RCA with thrombus --> PCI performed placing stent (unknown type) to RCA   T2DM (type 2 diabetes mellitus) (HCC)     Social History   Socioeconomic History   Marital status: Single    Spouse name: Not on file   Number of children: Not on file   Years of education: Not on file   Highest education level: Not on file  Occupational History   Not on file  Tobacco Use   Smoking status: Former    Current packs/day: 0.00    Types: Cigarettes    Quit date: 2000    Years since quitting: 24.5   Smokeless tobacco: Never  Vaping Use   Vaping status: Never Used  Substance and Sexual Activity   Alcohol use: Yes    Comment: rarely   Drug use: No   Sexual activity: Not on file  Other Topics Concern   Not on file  Social History Narrative   Not on file   Social Determinants of Health   Financial Resource Strain: Not on file  Food  Insecurity: No Food Insecurity (10/12/2021)   Hunger Vital Sign    Worried About Running Out of Food in the Last Year: Never true    Ran Out of Food in the Last Year: Never true  Transportation Needs: No Transportation Needs (10/12/2021)   PRAPARE - Administrator, Civil Service (Medical): No    Lack of Transportation (Non-Medical): No  Physical Activity: Not on file  Stress: Not on file  Social Connections: Not on file  Intimate Partner Violence: Not At Risk (10/12/2021)   Humiliation, Afraid, Rape, and Kick questionnaire    Fear of Current or Ex-Partner: No    Emotionally Abused: No    Physically Abused: No    Sexually Abused: No    Past Surgical History:  Procedure Laterality Date   CATARACT EXTRACTION, BILATERAL     COLONOSCOPY WITH PROPOFOL N/A 09/01/2018   Procedure: COLONOSCOPY WITH PROPOFOL;  Surgeon: Toledo, Boykin Nearing, MD;  Location: [facility] ENDOSCOPY;  Service: Gastroenterology;  Laterality: N/A;   CORONARY ANGIOPLASTY WITH STENT PLACEMENT     DIALYSIS/PERMA CATHETER INSERTION N/A 10/26/2021   Procedure: DIALYSIS/PERMA CATHETER INSERTION;  Surgeon: Annice Needy, MD;  Location: [facility] IN[facility]ASIVE [facility];  Service: Cardiovascular;  Laterality: N/A;   DIALYSIS/PERMA CATHETER REMOVAL N/A 12/18/2021   Procedure: DIALYSIS/PERMA CATHETER REMOVAL;  Surgeon: Annice Needy, MD;  Location: [facility] CV [facility];  Service: Cardiovascular;  Laterality: N/A;   ENDOVASCULAR REPAIR/STENT GRAFT N/A 03/01/2021   Procedure: ENDOVASCULAR REPAIR/STENT GRAFT;  Surgeon: Annice Needy, MD;  Location: [facility];  Service: Cardiovascular;  Laterality: N/A;   ESOPHAGOGASTRODUODENOSCOPY (EGD) WITH PROPOFOL N/A 03/13/2017   Procedure: ESOPHAGOGASTRODUODENOSCOPY (EGD) WITH PROPOFOL;  Surgeon: Pasty Spillers, MD;  Location: [facility] ENDOSCOPY;  Service: Endoscopy;  Laterality: N/A;   KNEE ARTHROSCOPY     RENAL ANGIOGRAPHY Bilateral 10/16/2021   Procedure: RENAL ANGIOGRAPHY;  Surgeon: Annice Needy, MD;  Location: [facility];  Service: Cardiovascular;  Laterality: Bilateral;   RENAL ANGIOGRAPHY Right 12/04/2021   Procedure: RENAL ANGIOGRAPHY;  Surgeon: Annice Needy, MD;  Location: [facility];  Service: Cardiovascular;  Laterality: Right;    Family History  Problem Relation Age of Onset   Breast cancer Other     No Known Allergies  Latest Ref Rng & Units 12/24/2021    8:36 AM 12/09/2021    9:23 AM 10/29/2021    4:44 AM  CBC  WBC 4.0 - 10.5 K/uL 7.3  9.5  10.3   Hemoglobin 12.0 - 15.0 g/dL 9.7  16.1  7.3   Hematocrit 36.0 - 46.0 % 29.3  29.7  21.6   Platelets 150 - 400 K/uL 275  296  195       CMP     Component Value Date/Time   NA 134 (L) 12/24/2021 0836   K 4.0 12/24/2021 0836   K 3.8 10/07/2012 1128   CL 102 12/24/2021 0836   CO2 22 12/24/2021 0836   GLUCOSE 80 12/24/2021 0836   BUN 14 12/24/2021 0836   CREATININE 1.18 (H) 12/24/2021 0836   CALCIUM 9.6 12/24/2021 0836   PROT 7.8 12/09/2021 0923   ALBUMIN 3.8 12/09/2021 0923   AST 33 12/09/2021 0923   ALT 17 12/09/2021 0923   ALKPHOS 68 12/09/2021 0923   BILITOT 1.0 12/09/2021 0923   GFRNONAA 47 (L) 12/24/2021 0836     No results found.     Assessment & Plan:   1. Renal artery stenosis (HCC) The patient's blood pressure has been well-controlled and her renal function is actually shown improvement with recent labs done last month.  I do not suspect that the pain that she is having in her side is related to worsening renal stenosis.  We will be able to reevaluate at her upcoming visit in September.  Based on her blood pressure and her renal function I also do not feel that there is any sign that her patency is threatened.  2. Superior mesenteric artery stenosis (HCC) The patient does have a known greater than 70% stenosis of the superior mesenteric artery however symptoms that she is describing are not consistent with mesenteric stenosis.  She does have an upcoming study in  September but we will be able to reevaluate.  However she has no symptoms concerning for ischemia  3. Leg swelling The patient does have some known chronic kidney disease which certainly is likely contributing to her lower extremity edema.  However there is also certainly a possibility she may have some underlying venous disease or lymphedema.  Will have the patient return at her upcoming follow-up visit for bilateral venous reflux studies to evaluate.  In the interim the patient is advised to start utilizing medical grade compression stockings 20 to 30 mmHg to be utilized daily.  She should also continue to elevate her lower extremities when not active as well as endeavor to get 15 to 20 minutes of activity 3 to 4 days/week.  4. Type 2 diabetes mellitus with microalbuminuria, without long-term current use of insulin (HCC) Continue hypoglycemic medications as already ordered, these medications have been reviewed and there are no changes at this time.  Hgb A1C to be monitored as already arranged by primary service   Current Outpatient Medications on File Prior to Visit  Medication Sig Dispense Refill   acetaminophen (TYLENOL) 500 MG tablet Take 1 tablet (500 mg total) by mouth every 6 (six) hours as needed. (Patient taking differently: Take 500 mg by mouth as needed.) 30 tablet 0   aspirin EC 81 MG tablet Take 81 mg by mouth daily.     atorvastatin (LIPITOR) 40 MG tablet Take 40 mg by mouth every evening.     carvedilol (COREG) 25 MG tablet Take 1 tablet (25 mg total) by mouth 2 (two) times  daily with a meal. 60 tablet 2   Cholecalciferol (VITAMIN D3) 50 MCG (2000 UT) capsule Take 2,000 Units by mouth daily.     cloNIDine (CATAPRES) 0.3 MG tablet Take 1 tablet (0.3 mg total) by mouth 2 (two) times daily. 30 tablet 1   clopidogrel (PLAVIX) 75 MG tablet Take 1 tablet (75 mg total) by mouth daily. 30 tablet 2   famotidine (PEPCID) 40 MG tablet Take 40 mg by mouth at bedtime.     glimepiride (AMARYL)  4 MG tablet Take 4 mg by mouth 2 (two) times daily.     NIFEdipine (ADALAT CC) 60 MG 24 hr tablet Take 1 tablet (60 mg total) by mouth daily. 30 tablet 2   pioglitazone (ACTOS) 15 MG tablet Take 7.5 mg by mouth daily.     No current facility-administered medications on file prior to visit.    There are no Patient Instructions on file for this visit. No follow-ups on file.   Georgiana Spinner, NP

## 2022-09-20 ENCOUNTER — Other Ambulatory Visit (INDEPENDENT_AMBULATORY_CARE_PROVIDER_SITE_OTHER): Payer: Self-pay | Admitting: Nurse Practitioner

## 2022-09-20 ENCOUNTER — Other Ambulatory Visit (INDEPENDENT_AMBULATORY_CARE_PROVIDER_SITE_OTHER): Payer: Self-pay | Admitting: Vascular Surgery

## 2022-09-20 DIAGNOSIS — I169 Hypertensive crisis, unspecified: Secondary | ICD-10-CM

## 2022-09-20 DIAGNOSIS — E785 Hyperlipidemia, unspecified: Secondary | ICD-10-CM

## 2022-09-20 DIAGNOSIS — I723 Aneurysm of iliac artery: Secondary | ICD-10-CM

## 2022-09-20 DIAGNOSIS — K551 Chronic vascular disorders of intestine: Secondary | ICD-10-CM

## 2022-09-20 DIAGNOSIS — E1122 Type 2 diabetes mellitus with diabetic chronic kidney disease: Secondary | ICD-10-CM

## 2022-09-20 DIAGNOSIS — I701 Atherosclerosis of renal artery: Secondary | ICD-10-CM

## 2022-09-20 DIAGNOSIS — M7989 Other specified soft tissue disorders: Secondary | ICD-10-CM

## 2022-09-27 ENCOUNTER — Ambulatory Visit (INDEPENDENT_AMBULATORY_CARE_PROVIDER_SITE_OTHER): Payer: Medicare Other

## 2022-09-27 ENCOUNTER — Encounter (INDEPENDENT_AMBULATORY_CARE_PROVIDER_SITE_OTHER): Payer: Self-pay | Admitting: Nurse Practitioner

## 2022-09-27 ENCOUNTER — Encounter (INDEPENDENT_AMBULATORY_CARE_PROVIDER_SITE_OTHER): Payer: Medicare Other

## 2022-09-27 ENCOUNTER — Ambulatory Visit (INDEPENDENT_AMBULATORY_CARE_PROVIDER_SITE_OTHER): Payer: Medicare Other | Admitting: Nurse Practitioner

## 2022-09-27 VITALS — BP 134/64 | HR 66 | Resp 16 | Wt 157.6 lb

## 2022-09-27 DIAGNOSIS — M7989 Other specified soft tissue disorders: Secondary | ICD-10-CM

## 2022-09-27 DIAGNOSIS — K551 Chronic vascular disorders of intestine: Secondary | ICD-10-CM

## 2022-09-27 DIAGNOSIS — I723 Aneurysm of iliac artery: Secondary | ICD-10-CM

## 2022-09-27 DIAGNOSIS — I701 Atherosclerosis of renal artery: Secondary | ICD-10-CM

## 2022-09-27 DIAGNOSIS — I10 Essential (primary) hypertension: Secondary | ICD-10-CM

## 2022-09-27 NOTE — Progress Notes (Signed)
Subjective:    Patient ID: Dawn Ford, female    DOB: 12-Dec-1945, 77 y.o.   MRN: [id number] Chief Complaint  Patient presents with   Follow-up    6 month ultrasound follow up    This is a 77 year old female that returns today for follow-up evaluation of her renal artery stenosis.  Previously she had some lower extremity edema but that has been under good control since her last visit.  She denies any abdominal pain or nausea vomiting or loss of weight.  The patient has a history of bilateral renal artery stenosis with renal artery stent placement.  Following these interventions she had acute kidney injury and for short time required dialysis.  Her kidney function is largely improved.  Her blood pressure is under good control.  She also has an a right common iliac artery aneurysm repair.  Today the distal aorta has measures 2.43 x 2.54 cm.  And 1.34 x 1.31 at stent extension    Review of Systems  Cardiovascular:  Negative for leg swelling.  Gastrointestinal:  Positive for abdominal pain and nausea.  All other systems reviewed and are negative.      Objective:   Physical Exam Vitals reviewed.  HENT:     Head: Normocephalic.  Cardiovascular:     Rate and Rhythm: Normal rate.     Pulses:          Dorsalis pedis pulses are 1+ on the right side and 1+ on the left side.  Pulmonary:     Effort: Pulmonary effort is normal.  Skin:    General: Skin is warm and dry.  Neurological:     Mental Status: She is alert and oriented to person, place, and time.  Psychiatric:        Mood and Affect: Mood normal.        Behavior: Behavior normal.        Thought Content: Thought content normal.        Judgment: Judgment normal.     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 b.) CTA  abdomen/pelvis 02/13/2021: measured 2.9 x 2.7 cm.   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   a.) CTA abdomen/pelvis: measured 2.6 cm   GERD (gastroesophageal reflux disease)    Hyperlipemia    Hypertension    NSTEMI (non-ST elevated myocardial infarction) (HCC) 02/17/1999   a.) LHC 02/17/1999 --> 75% LAD and subtotal RCA with thrombus --> PCI performed placing stent (unknown type) to RCA   T2DM (type 2 diabetes mellitus) (HCC)     Social History   Socioeconomic History   Marital status: Single    Spouse name: Not on file   Number of children: Not on file   Years of education: Not on file   Highest education level: Not on file  Occupational History   Not on file  Tobacco Use   Smoking status: Former    Current packs/day: 0.00    Types: Cigarettes    Quit date: 2000  Years since quitting: 24.7   Smokeless tobacco: Never  Vaping Use   Vaping status: Never Used  Substance and Sexual Activity   Alcohol use: Yes    Comment: rarely   Drug use: No   Sexual activity: Not on file  Other Topics Concern   Not on file  Social History Narrative   Not on file   Social Determinants of Health   Financial Resource Strain: Low Risk  (09/24/2022)   Received from [facility]   Overall Financial Resource Strain (CARDIA)    Difficulty of Paying Living Expenses: Not hard at all  Food Insecurity: No Food Insecurity (09/24/2022)   Received from [facility]   Hunger Vital Sign    Worried About Running Out of Food in the Last Year: Never true    Ran Out of Food in the Last Year: Never true  Transportation Needs: No Transportation Needs (09/24/2022)   Received from [facility] - Transportation    In the past 12 months, has lack of transportation  kept you from medical appointments or from getting medications?: No    Lack of Transportation (Non-Medical): No  Physical Activity: Not on file  Stress: Not on file  Social Connections: Not on file  Intimate Partner Violence: Not At Risk (10/12/2021)   Humiliation, Afraid, Rape, and Kick questionnaire    Fear of Current or Ex-Partner: No    Emotionally Abused: No    Physically Abused: No    Sexually Abused: No    Past Surgical History:  Procedure Laterality Date   CATARACT EXTRACTION, BILATERAL     COLONOSCOPY WITH PROPOFOL N/A 09/01/2018   Procedure: COLONOSCOPY WITH PROPOFOL;  Surgeon: Toledo, Boykin Nearing, MD;  Location: [facility] ENDOSCOPY;  Service: Gastroenterology;  Laterality: N/A;   CORONARY ANGIOPLASTY WITH STENT PLACEMENT     DIALYSIS/PERMA CATHETER INSERTION N/A 10/26/2021   Procedure: DIALYSIS/PERMA CATHETER INSERTION;  Surgeon: Annice Needy, MD;  Location: [facility] INVASIVE CV LAB;  Service: Cardiovascular;  Laterality: N/A;   DIALYSIS/PERMA CATHETER REMOVAL N/A 12/18/2021   Procedure: DIALYSIS/PERMA CATHETER REMOVAL;  Surgeon: Annice Needy, MD;  Location: [facility] INVASIVE CV LAB;  Service: Cardiovascular;  Laterality: N/A;   ENDOVASCULAR REPAIR/STENT GRAFT N/A 03/01/2021   Procedure: ENDOVASCULAR REPAIR/STENT GRAFT;  Surgeon: Annice Needy, MD;  Location: [facility] INVASIVE CV LAB;  Service: Cardiovascular;  Laterality: N/A;   ESOPHAGOGASTRODUODENOSCOPY (EGD) WITH PROPOFOL N/A 03/13/2017   Procedure: ESOPHAGOGASTRODUODENOSCOPY (EGD) WITH PROPOFOL;  Surgeon: Pasty Spillers, MD;  Location: [facility] ENDOSCOPY;  Service: Endoscopy;  Laterality: N/A;   KNEE ARTHROSCOPY     RENAL ANGIOGRAPHY Bilateral 10/16/2021   Procedure: RENAL ANGIOGRAPHY;  Surgeon: Annice Needy, MD;  Location: [facility] INVASIVE CV LAB;  Service: Cardiovascular;  Laterality: Bilateral;   RENAL ANGIOGRAPHY Right 12/04/2021   Procedure: RENAL ANGIOGRAPHY;  Surgeon: Annice Needy, MD;  Location: [facility] INVASIVE CV LAB;  Service:  Cardiovascular;  Laterality: Right;    Family History  Problem Relation Age of Onset   Breast cancer Other     No Known Allergies     Latest Ref Rng & Units 12/24/2021    8:36 AM 12/09/2021    9:23 AM 10/29/2021    4:44 AM  CBC  WBC 4.0 - 10.5 K/uL 7.3  9.5  10.3   Hemoglobin 12.0 - 15.0 g/dL 9.7  16.1  7.3   Hematocrit 36.0 - 46.0 % 29.3  29.7  21.6   Platelets 150 -  400 K/uL 275  296  195       CMP     Component Value Date/Time   NA 134 (L) 12/24/2021 0836   K 4.0 12/24/2021 0836   K 3.8 10/07/2012 1128   CL 102 12/24/2021 0836   CO2 22 12/24/2021 0836   GLUCOSE 80 12/24/2021 0836   BUN 14 12/24/2021 0836   CREATININE 1.18 (H) 12/24/2021 0836   CALCIUM 9.6 12/24/2021 0836   PROT 7.8 12/09/2021 0923   ALBUMIN 3.8 12/09/2021 0923   AST 33 12/09/2021 0923   ALT 17 12/09/2021 0923   ALKPHOS 68 12/09/2021 0923   BILITOT 1.0 12/09/2021 0923   GFRNONAA 47 (L) 12/24/2021 0836     No results found.     Assessment & Plan:   1. Renal artery stenosis (HCC) Recommend:  BP today was acceptable Given patient's atherosclerosis and PAD optimal control of the patient's hypertension is important.  The patient's BP and noninvasive studies support the previous intervention is patent. No further intervention is indicated at this time.  Therefore the patient  will continue the current medications, no changes at this time.  The primary medical service will continue aggressive antihypertensive therapy as per the AHA guidelines   2. Superior mesenteric artery stenosis (HCC) The patient does have a known greater than 70% stenosis of the superior mesenteric artery however symptoms that she is describing are not consistent with mesenteric stenosis.  We will continue to monitor patient is advised that if she begins to have any issues that are concerning such as nausea and vomiting after eating, abdominal pain or unexpected weight loss to contact our office for further  evaluation.  3. Leg swelling This is improved  4. Iliac artery aneurysm (HCC) Today the patient studies show stable size of the right common iliac artery aneurysm postrepair.  Will plan on reevaluating patient in 6 months.    5. Benign essential hypertension Continue antihypertensive medications as already ordered, these medications have been reviewed and there are no changes at this time.   Current Outpatient Medications on File Prior to Visit  Medication Sig Dispense Refill   acetaminophen (TYLENOL) 500 MG tablet Take 1 tablet (500 mg total) by mouth every 6 (six) hours as needed. (Patient taking differently: Take 500 mg by mouth as needed.) 30 tablet 0   aspirin EC 81 MG tablet Take 81 mg by mouth daily.     atorvastatin (LIPITOR) 40 MG tablet Take 40 mg by mouth every evening.     carvedilol (COREG) 25 MG tablet Take 1 tablet (25 mg total) by mouth 2 (two) times daily with a meal. 60 tablet 2   Cholecalciferol (VITAMIN D3) 50 MCG (2000 UT) capsule Take 2,000 Units by mouth daily.     cloNIDine (CATAPRES) 0.3 MG tablet Take 1 tablet (0.3 mg total) by mouth 2 (two) times daily. 30 tablet 1   clopidogrel (PLAVIX) 75 MG tablet Take 1 tablet (75 mg total) by mouth daily. 30 tablet 2   famotidine (PEPCID) 40 MG tablet Take 40 mg by mouth at bedtime.     glimepiride (AMARYL) 4 MG tablet Take 4 mg by mouth 2 (two) times daily.     NIFEdipine (ADALAT CC) 60 MG 24 hr tablet Take 1 tablet (60 mg total) by mouth daily. 30 tablet 2   pioglitazone (ACTOS) 15 MG tablet Take 7.5 mg by mouth daily.     No current facility-administered medications on file prior to visit.    There  are no Patient Instructions on file for this visit. No follow-ups on file.   Georgiana Spinner, NP

## 2022-12-25 ENCOUNTER — Other Ambulatory Visit (INDEPENDENT_AMBULATORY_CARE_PROVIDER_SITE_OTHER): Payer: Self-pay | Admitting: Nurse Practitioner

## 2022-12-25 DIAGNOSIS — I723 Aneurysm of iliac artery: Secondary | ICD-10-CM

## 2022-12-27 ENCOUNTER — Ambulatory Visit (INDEPENDENT_AMBULATORY_CARE_PROVIDER_SITE_OTHER): Payer: 59 | Admitting: Nurse Practitioner

## 2022-12-27 ENCOUNTER — Encounter (INDEPENDENT_AMBULATORY_CARE_PROVIDER_SITE_OTHER): Payer: Self-pay | Admitting: Nurse Practitioner

## 2022-12-27 ENCOUNTER — Ambulatory Visit (INDEPENDENT_AMBULATORY_CARE_PROVIDER_SITE_OTHER): Payer: 59

## 2022-12-27 VITALS — BP 130/68 | HR 64 | Resp 18 | Ht 67.0 in | Wt 165.2 lb

## 2022-12-27 DIAGNOSIS — K551 Chronic vascular disorders of intestine: Secondary | ICD-10-CM | POA: Diagnosis not present

## 2022-12-27 DIAGNOSIS — I723 Aneurysm of iliac artery: Secondary | ICD-10-CM

## 2022-12-27 DIAGNOSIS — I701 Atherosclerosis of renal artery: Secondary | ICD-10-CM | POA: Diagnosis not present

## 2022-12-27 DIAGNOSIS — I10 Essential (primary) hypertension: Secondary | ICD-10-CM

## 2023-01-07 NOTE — Progress Notes (Signed)
Subjective:    Patient ID: Dawn Ford, female    DOB: [birth year], 77 y.o.   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  She also has a history of renal artery stenosis.  Her blood pressures have been well-controlled.  She currently has not suffered any significant worsening of renal function.  Her leg swelling has been much improved although it does happen occasionally.  She denies any claudication-like symptoms.  Currently there are no open wounds or ulcerations.  Today noninvasive studies show common iliac artery measurement of 2.3 cm at its maximal.  Previously measured at 2.1 cm.  Additionally there are some elevated velocities seen in the bilateral external iliac arteries.    Review of Systems  Cardiovascular:  Positive for leg swelling.  All other systems reviewed and are negative.      Objective:   Physical Exam Vitals reviewed.  HENT:     Head: Normocephalic.  Cardiovascular:     Rate and Rhythm: Normal rate.     Pulses: Normal pulses.  Pulmonary:     Effort: Pulmonary effort is normal.  Musculoskeletal:     Right lower leg: No edema.     Left lower leg: No edema.  Skin:    General: Skin is warm and dry.  Neurological:     Mental Status: She is alert and oriented to person, place, and time.  Psychiatric:        Mood and Affect: Mood normal.        Behavior: Behavior normal.        Thought Content: Thought content normal.        Judgment: Judgment normal.     BP 130/68   Pulse 64   Resp 18   Ht 5\' 7"  (1.702 m)   Wt 165 lb 3.2 oz (74.9 kg)   BMI 25.87 kg/m   Past Medical History:  Diagnosis Date   Aneurysm of right common iliac artery (HCC) 01/19/2021   a.) US aorta 01/19/2021: measure 2.8 cm. b.) CTA abdomen/pelvis 02/13/2021: measured 2.9 x 2.7 cm.   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   a.) CTA abdomen/pelvis: measured 2.6 cm   GERD (gastroesophageal reflux disease)    Hyperlipemia    Hypertension    NSTEMI (non-ST elevated myocardial infarction) (HCC) 02/17/1999   a.) LHC 02/17/1999 --> 75% LAD and subtotal RCA with thrombus --> PCI performed placing stent (unknown type) to RCA   T2DM (type 2 diabetes mellitus) (HCC)     Social History   Socioeconomic History   Marital status: Single    Spouse name: Not on file   Number of children: Not on file   Years of education: Not on file   Highest education level: Not on file  Occupational History   Not on file  Tobacco Use   Smoking status: Former    Current packs/day: 0.00    Types: Cigarettes    Quit date: 2000    Years since quitting: 25.0   Smokeless tobacco: Never  Vaping Use   Vaping status: Never Used  Substance and Sexual Activity  Alcohol use: Yes    Comment: rarely   Drug use: No   Sexual activity: Not on file  Other Topics Concern   Not on file  Social History Narrative   Not on file   Social Drivers of Health   Financial Resource Strain: Low Risk  (09/24/2022)   Received from [facility]   Overall Financial Resource Strain (CARDIA)    Difficulty of Paying Living Expenses: Not hard at all  Food Insecurity: No Food Insecurity (09/24/2022)   Received from [facility]   Hunger Vital Sign    Worried About Running Out of Food in the Last Year: Never true    Ran Out of Food in the Last Year: Never true  Transportation Needs: No Transportation Needs (09/24/2022)   Received from [facility] - Transportation    In the past 12 months, has lack of transportation kept you from medical appointments or from getting medications?: No    Lack of  Transportation (Non-Medical): No  Physical Activity: Not on file  Stress: Not on file  Social Connections: Not on file  Intimate Partner Violence: Not At Risk (10/12/2021)   Humiliation, Afraid, Rape, and Kick questionnaire    Fear of Current or Ex-Partner: No    Emotionally Abused: No    Physically Abused: No    Sexually Abused: No    Past Surgical History:  Procedure Laterality Date   CATARACT EXTRACTION, BILATERAL     COLONOSCOPY WITH PROPOFOL N/A 09/01/2018   Procedure: COLONOSCOPY WITH PROPOFOL;  Surgeon: Toledo, Boykin Nearing, MD;  Location: [facility] ENDOSCOPY;  Service: Gastroenterology;  Laterality: N/A;   CORONARY ANGIOPLASTY WITH STENT PLACEMENT     DIALYSIS/PERMA CATHETER INSERTION N/A 10/26/2021   Procedure: DIALYSIS/PERMA CATHETER INSERTION;  Surgeon: Annice Needy, MD;  Location: [facility];  Service: Cardiovascular;  Laterality: N/A;   DIALYSIS/PERMA CATHETER REMOVAL N/A 12/18/2021   Procedure: DIALYSIS/PERMA CATHETER REMOVAL;  Surgeon: Annice Needy, MD;  Location: [facility];  Service: Cardiovascular;  Laterality: N/A;   ENDOVASCULAR REPAIR/STENT GRAFT N/A 03/01/2021   Procedure: ENDOVASCULAR REPAIR/STENT GRAFT;  Surgeon: Annice Needy, MD;  Location: [facility];  Service: Cardiovascular;  Laterality: N/A;   ESOPHAGOGASTRODUODENOSCOPY (EGD) WITH PROPOFOL N/A 03/13/2017   Procedure: ESOPHAGOGASTRODUODENOSCOPY (EGD) WITH PROPOFOL;  Surgeon: Pasty Spillers, MD;  Location: [facility] ENDOSCOPY;  Service: Endoscopy;  Laterality: N/A;   KNEE ARTHROSCOPY     RENAL ANGIOGRAPHY Bilateral 10/16/2021   Procedure: RENAL ANGIOGRAPHY;  Surgeon: Annice Needy, MD;  Location: [facility];  Service: Cardiovascular;  Laterality: Bilateral;   RENAL ANGIOGRAPHY Right 12/04/2021   Procedure: RENAL ANGIOGRAPHY;  Surgeon: Annice Needy, MD;  Location: [facility];  Service: Cardiovascular;  Laterality: Right;    Family History  Problem Relation Age of Onset    Breast cancer Other     No Known Allergies     Latest Ref Rng & Units 12/24/2021    8:36 AM 12/09/2021    9:23 AM 10/29/2021    4:44 AM  CBC  WBC 4.0 - 10.5 K/uL 7.3  9.5  10.3   Hemoglobin 12.0 - 15.0 g/dL 9.7  32.4  7.3   Hematocrit 36.0 - 46.0 % 29.3  29.7  21.6   Platelets 150 - 400 K/uL 275  296  195       CMP     Component Value Date/Time   NA 134 (  L) 12/24/2021 0836   K 4.0 12/24/2021 0836   K 3.8 10/07/2012 1128   CL 102 12/24/2021 0836   CO2 22 12/24/2021 0836   GLUCOSE 80 12/24/2021 0836   BUN 14 12/24/2021 0836   CREATININE 1.18 (H) 12/24/2021 0836   CALCIUM 9.6 12/24/2021 0836   PROT 7.8 12/09/2021 0923   ALBUMIN 3.8 12/09/2021 0923   AST 33 12/09/2021 0923   ALT 17 12/09/2021 0923   ALKPHOS 68 12/09/2021 0923   BILITOT 1.0 12/09/2021 0923   GFRNONAA 47 (L) 12/24/2021 0836     No results found.     Assessment & Plan:   1. Iliac artery aneurysm (HCC) (Primary) Stent graft appears to be patent today.  Maximal aortic diameter is measured at 2.3 cm which is relatively stable from previous measurements of 2.1 cm.  Will continue to monitor again in 6 months.  2. Benign essential hypertension Continue antihypertensive medications as already ordered, these medications have been reviewed and there are no changes at this time.  3. Superior mesenteric artery stenosis (HCC) The patient continues to have no significant pain or discomfort with eating or postprandial symptoms.  Will reevaluate in 6 months  4. Renal artery stenosis (HCC) Currently patient's blood pressures were well-controlled.  When last evaluated in September her stent was widely patent.  Will reevaluate in 6 months   Current Outpatient Medications on File Prior to Visit  Medication Sig Dispense Refill   acetaminophen (TYLENOL) 500 MG tablet Take 1 tablet (500 mg total) by mouth every 6 (six) hours as needed. (Patient taking differently: Take 500 mg by mouth as needed.) 30 tablet 0    aspirin EC 81 MG tablet Take 81 mg by mouth daily.     atorvastatin (LIPITOR) 40 MG tablet Take 40 mg by mouth every evening.     carvedilol (COREG) 25 MG tablet Take 1 tablet (25 mg total) by mouth 2 (two) times daily with a meal. 60 tablet 2   Cholecalciferol (VITAMIN D3) 50 MCG (2000 UT) capsule Take 2,000 Units by mouth daily.     cloNIDine (CATAPRES) 0.3 MG tablet Take 1 tablet (0.3 mg total) by mouth 2 (two) times daily. 30 tablet 1   clopidogrel (PLAVIX) 75 MG tablet Take 1 tablet (75 mg total) by mouth daily. 30 tablet 2   famotidine (PEPCID) 40 MG tablet Take 40 mg by mouth at bedtime.     glimepiride (AMARYL) 4 MG tablet Take 4 mg by mouth 2 (two) times daily.     NIFEdipine (ADALAT CC) 60 MG 24 hr tablet Take 1 tablet (60 mg total) by mouth daily. 30 tablet 2   pioglitazone (ACTOS) 15 MG tablet Take 7.5 mg by mouth daily.     Vitamin D, Ergocalciferol, (DRISDOL) 1.25 MG (50000 UNIT) CAPS capsule Take 50,000 Units by mouth once a week.     No current facility-administered medications on file prior to visit.    There are no Patient Instructions on file for this visit. No follow-ups on file.   Georgiana Spinner, NP

## 2023-02-03 ENCOUNTER — Other Ambulatory Visit: Payer: Self-pay

## 2023-02-03 ENCOUNTER — Emergency Department
Admission: EM | Admit: 2023-02-03 | Discharge: 2023-02-03 | Disposition: A | Discharge status: Home / Self Care | Payer: 59 | Attending: Emergency Medicine | Admitting: Emergency Medicine

## 2023-02-03 ENCOUNTER — Encounter: Payer: Self-pay | Admitting: Emergency Medicine

## 2023-02-03 DIAGNOSIS — M545 Low back pain, unspecified: Secondary | ICD-10-CM | POA: Diagnosis present

## 2023-02-03 DIAGNOSIS — M533 Sacrococcygeal disorders, not elsewhere classified: Secondary | ICD-10-CM | POA: Diagnosis not present

## 2023-02-03 DIAGNOSIS — N189 Chronic kidney disease, unspecified: Secondary | ICD-10-CM | POA: Insufficient documentation

## 2023-02-03 LAB — URINALYSIS, ROUTINE W REFLEX MICROSCOPIC
Bacteria, UA: NONE SEEN
Bilirubin Urine: NEGATIVE
Glucose, UA: >=500 mg/dL — AB
Hgb urine dipstick: NEGATIVE
Ketones, ur: NEGATIVE mg/dL
Leukocytes,Ua: NEGATIVE
Nitrite: NEGATIVE
Protein, ur: NEGATIVE mg/dL
RBC / HPF: 0 RBC/hpf (ref 0–5)
Specific Gravity, Urine: 1.029 (ref 1.005–1.030)
pH: 5 (ref 5.0–8.0)

## 2023-02-03 LAB — HEPATIC FUNCTION PANEL
ALT: 15 U/L (ref 0–44)
AST: 17 U/L (ref 15–41)
Albumin: 3.5 g/dL (ref 3.5–5.0)
Alkaline Phosphatase: 82 U/L (ref 38–126)
Bilirubin, Direct: <0.1 mg/dL (ref 0.0–0.2)
Total Bilirubin: 0.6 mg/dL (ref 0.0–1.2)
Total Protein: 7.1 g/dL (ref 6.5–8.1)

## 2023-02-03 LAB — BASIC METABOLIC PANEL
Anion gap: 9 (ref 5–15)
BUN: 24 mg/dL — ABNORMAL HIGH (ref 8–23)
CO2: 23 mmol/L (ref 22–32)
Calcium: 9 mg/dL (ref 8.9–10.3)
Chloride: 101 mmol/L (ref 98–111)
Creatinine, Ser: 1.29 mg/dL — ABNORMAL HIGH (ref 0.44–1.00)
GFR, Estimated: 42 mL/min — ABNORMAL LOW (ref >60)
Glucose, Bld: 403 mg/dL — ABNORMAL HIGH (ref 70–99)
Potassium: 4 mmol/L (ref 3.5–5.1)
Sodium: 133 mmol/L — ABNORMAL LOW (ref 135–145)

## 2023-02-03 LAB — CBC
HCT: 36.2 % (ref 36.0–46.0)
Hemoglobin: 11.8 g/dL — ABNORMAL LOW (ref 12.0–15.0)
MCH: 28.2 pg (ref 26.0–34.0)
MCHC: 32.6 g/dL (ref 30.0–36.0)
MCV: 86.6 fL (ref 80.0–100.0)
Platelets: 214 10*3/uL (ref 150–400)
RBC: 4.18 MIL/uL (ref 3.87–5.11)
RDW: 13.2 % (ref 11.5–15.5)
WBC: 7.7 10*3/uL (ref 4.0–10.5)
nRBC: 0 % (ref 0.0–0.2)

## 2023-02-03 LAB — LIPASE, BLOOD: Lipase: 38 U/L (ref 11–51)

## 2023-02-03 MED ORDER — LIDOCAINE 5 % EX PTCH: 1.0000 | MEDICATED_PATCH | Freq: Two times a day (BID) | CUTANEOUS | 0 refills | Status: AC

## 2023-02-03 MED ORDER — LIDOCAINE 5 % EX PTCH
1.0000 | MEDICATED_PATCH | Freq: Once | CUTANEOUS | Status: DC
Start: 2023-02-03 — End: 2023-02-03
  Administered 2023-02-03: 1 via TRANSDERMAL
  Filled 2023-02-03: qty 1

## 2023-02-03 MED ORDER — ACETAMINOPHEN 500 MG PO TABS
1000.0000 mg | ORAL_TABLET | Freq: Once | ORAL | Status: AC
  Administered 2023-02-03: 1000 mg via ORAL
  Filled 2023-02-03: qty 2

## 2023-02-03 NOTE — ED Provider Triage Note (Signed)
Emergency Medicine Provider Triage Evaluation Note  Dawn Ford , a 78 y.o. female  was evaluated in triage.  Pt complains of back pain on left side x 2 days. No known injury. Pain is worse with movement. History of CKD. No urinary symptoms or fever.  Physical Exam  BP 115/88   Pulse 65   Temp 98.7 F (37.1 C) (Oral)   Resp 16   Ht 5\' 6"  (1.676 m)   Wt 72.6 kg   SpO2 97%   BMI 25.82 kg/m  Gen:   Awake, no distress   Resp:  Normal effort  MSK:   Moves extremities without difficulty Other:    Medical Decision Making  Medically screening exam initiated at 2:57 PM.  Appropriate orders placed.  Maris L Blumstein was informed that the remainder of the evaluation will be completed by another provider, this initial triage assessment does not replace that evaluation, and the importance of remaining in the ED until their evaluation is complete.    Chinita Pester, FNP 02/03/23 1458

## 2023-02-03 NOTE — Discharge Instructions (Addendum)
Take Tylenol 1 g every 8 hours for the next week with the lidocaine patches.  Follow-up with your primary care doctor and if this is not improving it consider imaging at that time.  For your sugars you talk to your primary care doctor as this can be dangerous and cause long-term issues with time.  However your sugar today was similar to what it has been like in December although it needs close follow-up there is no signs of complication today such as DKA.  Return to the ER for worsening symptoms changes symptoms that is abdominal pain, fevers or any other concerns

## 2023-02-03 NOTE — ED Provider Notes (Signed)
[facility] Provider Note    Event Date/Time   First MD Initiated Contact with Patient 02/03/23 1623     (approximate)   History   Back Pain (Patient stated that she does not have any abdominal pain. Patient stated that she has side back pain.)   HPI  Dawn Ford is a 78 y.o. female who comes in with back pain on the left side for the past 2 days.  No known injury.  Patient has a history of CKD.  Patient reports that she has a history of hyperglycemia.  She has been seen by her primary care doctor and are trying to figure out what to put her on.  She reports many medications that she has not been tolerating.  She denies being able to take metformin and a few other medications that she cannot remember the names of.  She states that the reason she came in today was for lower left flank pain.  She denies any urinary symptoms.  Denies any pain when she is at rest.  She only reports pain when she tries to move.  She denies any falls.  She states this is been going on for 2 days.  She reports that she had this previously a few years ago when she did not have any kidney problems her doctor put her on meloxicam and the pain went away.  Denies any saddle anesthesia, numbness, tingling,    Physical Exam   Triage Vital Signs: ED Triage Vitals  Encounter Vitals Group     BP 02/03/23 1446 115/88     Systolic BP Percentile --      Diastolic BP Percentile --      Pulse Rate 02/03/23 1446 65     Resp 02/03/23 1446 16     Temp 02/03/23 1446 98.7 F (37.1 C)     Temp Source 02/03/23 1446 Oral     SpO2 02/03/23 1446 97 %     Weight 02/03/23 1446 160 lb (72.6 kg)     Height 02/03/23 1446 5\' 6"  (1.676 m)     Head Circumference --      Peak Flow --      Pain Score 02/03/23 1452 5     Pain Loc --      Pain Education --      Exclude from Growth Chart --     Most recent vital signs: Vitals:   02/03/23 1446  BP: 115/88  Pulse: 65  Resp: 16  Temp: 98.7 F (37.1 C)   SpO2: 97%     General: Awake, no distress.  CV:  Good peripheral perfusion.  Resp:  Normal effort.  Abd:  No distention.  Other:  Able to lift both legs up off the bed.  She reports pain with lifting the left leg.  She got positive SI joint pain on the left.  No rash noted.  She is able to ambulate with cane.  She got good distal pulses warm and well-perfused feet.  No numbness noted.  She got no CTL spine tenderness.   ED Results / Procedures / Treatments   Labs (all labs ordered are listed, but only abnormal results are displayed) Labs Reviewed  CBC - Abnormal; Notable for the following components:      Result Value   Hemoglobin 11.8 (*)    All other components within normal limits  BASIC METABOLIC PANEL - Abnormal; Notable for the following components:   Sodium 133 (*)  Glucose, Bld 403 (*)    BUN 24 (*)    Creatinine, Ser 1.29 (*)    GFR, Estimated 42 (*)    All other components within normal limits  URINALYSIS, ROUTINE W REFLEX MICROSCOPIC - Abnormal; Notable for the following components:   Color, Urine STRAW (*)    APPearance CLEAR (*)    Glucose, UA >=500 (*)    All other components within normal limits     PROCEDURES:  Critical Care performed: No  Procedures   MEDICATIONS ORDERED IN ED: Medications - No data to display   IMPRESSION / MDM / ASSESSMENT AND PLAN / ED COURSE  I reviewed the triage vital signs and the nursing notes.   Patient's presentation is most consistent with acute presentation with potential threat to life or bodily function.   Patient comes in with low left back pain.  No evidence of cord compression.  She got point tenderness over her SI joint.  No radicular symptoms.  I suspect this is more likely musculoskeletal, SI joint inflammation, bursitis.  No signs for infection.  Her abdomen is soft and nontender do not believe this represents aortic aneurysm rupture.  She has no pain when laying still only pain with moving the left leg.   There is no CTL spine tenderness to suggest needing imaging.  This is also on going on for 2 days so doubt a more malignant concern.  We discussed symptomatic treatment at first and if things are getting better think to consider imaging then.  She expressed understanding felt comfortable with that plan  UA without evidence of UTI or RBCs.  BMP shows creatinine that is around baseline.  Patient's glucose is elevated at 403 but no anion gap.  CBC shows improving hemoglobin of 11.8  We discussed that the glucose is incidental in nature.  We discussed that she is got no evidence of DKA and this is not the cause of her left flank pain.  However I am hesitant to try to start her on anything here from the emergency room given she reports multiple medication intolerances that she is not sure at all of their names.  She states that she is going to call her primary care doctor tomorrow to get follow-up for this and she understands importance of this.  I reviewed and her glucose back in December was 300 therefore this is not acute.   We did discuss holding off on ibuprofen, meloxicam given her creatinine and history of renal issues but we discussed using Tylenol, lidocaine patches.  Patient feels much better after medications and feels comfortable with discharge home and follow-up with PCP     FINAL CLINICAL IMPRESSION(S) / ED DIAGNOSES   Final diagnoses:  SI (sacroiliac) pain  Left-sided low back pain without sciatica, unspecified chronicity     Rx / DC Orders   ED Discharge Orders          Ordered    lidocaine (LIDODERM) 5 %  Every 12 hours        02/03/23 1723             Note:  This document was prepared using Dragon voice recognition software and may include unintentional dictation errors.   Concha Se, MD 02/03/23 [phone]

## 2023-02-03 NOTE — ED Triage Notes (Signed)
Pt via POV from home. Pt c/o L sided back pain that started Friday. Reports the pain is worse with movement but travels to the [facility], reports a hx of CKD. Denies any urinary symptoms. Denies NVD. Pt is A&Ox4 and NAD.

## 2023-06-25 ENCOUNTER — Other Ambulatory Visit: Payer: Self-pay | Admitting: Internal Medicine

## 2023-06-25 DIAGNOSIS — Z1231 Encounter for screening mammogram for malignant neoplasm of breast: Secondary | ICD-10-CM

## 2023-07-08 ENCOUNTER — Other Ambulatory Visit (INDEPENDENT_AMBULATORY_CARE_PROVIDER_SITE_OTHER): Payer: Self-pay | Admitting: Nurse Practitioner

## 2023-07-08 DIAGNOSIS — K551 Chronic vascular disorders of intestine: Secondary | ICD-10-CM

## 2023-07-08 DIAGNOSIS — I739 Peripheral vascular disease, unspecified: Secondary | ICD-10-CM

## 2023-07-08 DIAGNOSIS — I701 Atherosclerosis of renal artery: Secondary | ICD-10-CM

## 2023-07-08 DIAGNOSIS — I723 Aneurysm of iliac artery: Secondary | ICD-10-CM

## 2023-07-09 ENCOUNTER — Encounter (INDEPENDENT_AMBULATORY_CARE_PROVIDER_SITE_OTHER): Payer: No Typology Code available for payment source

## 2023-07-09 ENCOUNTER — Other Ambulatory Visit (INDEPENDENT_AMBULATORY_CARE_PROVIDER_SITE_OTHER)

## 2023-07-09 ENCOUNTER — Ambulatory Visit (INDEPENDENT_AMBULATORY_CARE_PROVIDER_SITE_OTHER): Payer: No Typology Code available for payment source

## 2023-07-09 ENCOUNTER — Ambulatory Visit (INDEPENDENT_AMBULATORY_CARE_PROVIDER_SITE_OTHER): Payer: No Typology Code available for payment source | Admitting: Vascular Surgery

## 2023-07-09 DIAGNOSIS — I701 Atherosclerosis of renal artery: Secondary | ICD-10-CM | POA: Diagnosis not present

## 2023-07-09 DIAGNOSIS — K551 Chronic vascular disorders of intestine: Secondary | ICD-10-CM | POA: Diagnosis not present

## 2023-07-09 DIAGNOSIS — I723 Aneurysm of iliac artery: Secondary | ICD-10-CM

## 2023-07-09 DIAGNOSIS — Z9889 Other specified postprocedural states: Secondary | ICD-10-CM | POA: Diagnosis not present

## 2023-07-09 DIAGNOSIS — I739 Peripheral vascular disease, unspecified: Secondary | ICD-10-CM

## 2023-07-10 LAB — VAS US ABI WITH/WO TBI
Left ABI: 1.1
Right ABI: 1.21

## 2023-07-16 ENCOUNTER — Encounter (INDEPENDENT_AMBULATORY_CARE_PROVIDER_SITE_OTHER): Payer: Self-pay | Admitting: Vascular Surgery

## 2023-07-16 ENCOUNTER — Ambulatory Visit (INDEPENDENT_AMBULATORY_CARE_PROVIDER_SITE_OTHER): Admitting: Vascular Surgery

## 2023-07-16 VITALS — BP 114/55 | HR 58 | Resp 18 | Ht 66.0 in | Wt 178.4 lb

## 2023-07-16 DIAGNOSIS — I701 Atherosclerosis of renal artery: Secondary | ICD-10-CM

## 2023-07-16 DIAGNOSIS — I723 Aneurysm of iliac artery: Secondary | ICD-10-CM | POA: Diagnosis not present

## 2023-07-16 DIAGNOSIS — E1122 Type 2 diabetes mellitus with diabetic chronic kidney disease: Secondary | ICD-10-CM

## 2023-07-16 DIAGNOSIS — N1832 Chronic kidney disease, stage 3b: Secondary | ICD-10-CM

## 2023-07-16 DIAGNOSIS — I160 Hypertensive urgency: Secondary | ICD-10-CM

## 2023-07-16 NOTE — Progress Notes (Unsigned)
 MRN : [id number]  Dawn Ford is a 78 y.o. (February 25, 1945) female who presents with chief complaint of  Chief Complaint  Patient presents with   Follow-up    6 month follow up ABI/Renal  .  History of Present Illness: Patient returns today in follow up of multiple vascular issues.  She is doing quite well.  She had return of her renal function after a short span on dialysis and her last GFR that I see from earlier this year was 64.  She is not having any lifestyle limiting claudication, ischemic rest pain, or ulceration.  She has a previous history of an aneurysm repair in the right iliac artery as well as renal artery stent placement which seemed to improve her renal function. Renal artery duplex demonstrates no evidence of hemodynamically significant renal artery stenosis bilaterally after previous stenting.  She also reports her blood pressure control is much better. She has a history of iliac artery aneurysm repair in the past.  She is doing well.  This was also patent on duplex without any obvious aneurysmal degeneration and her ABIs were 1.2 bilaterally.  Current Outpatient Medications  Medication Sig Dispense Refill   acetaminophen  (TYLENOL ) 500 MG tablet Take 1 tablet (500 mg total) by mouth every 6 (six) hours as needed. 30 tablet 0   aspirin  EC 81 MG tablet Take 81 mg by mouth daily.     carvedilol  (COREG ) 25 MG tablet Take 1 tablet (25 mg total) by mouth 2 (two) times daily with a meal. 60 tablet 2   Cholecalciferol (VITAMIN D3) 50 MCG (2000 UT) capsule Take 2,000 Units by mouth daily.     cloNIDine  (CATAPRES ) 0.3 MG tablet Take 1 tablet (0.3 mg total) by mouth 2 (two) times daily. 30 tablet 1   clopidogrel  (PLAVIX ) 75 MG tablet Take 1 tablet (75 mg total) by mouth daily. 30 tablet 2   empagliflozin (JARDIANCE) 25 MG TABS tablet Take 25 mg by mouth daily.     famotidine  (PEPCID ) 40 MG tablet Take 40 mg by mouth at bedtime.     glimepiride  (AMARYL ) 4 MG tablet Take 4 mg by mouth 2  (two) times daily.     NIFEdipine  (ADALAT  CC) 60 MG 24 hr tablet Take 1 tablet (60 mg total) by mouth daily. 30 tablet 2   nitroGLYCERIN  (NITROSTAT ) 0.4 MG SL tablet Place 0.4 mg under the tongue every 5 (five) minutes as needed for chest pain.     ONETOUCH VERIO test strip 1 each daily.     Vitamin D, Ergocalciferol, (DRISDOL) 1.25 MG (50000 UNIT) CAPS capsule Take 50,000 Units by mouth once a week.     atorvastatin  (LIPITOR) 40 MG tablet Take 40 mg by mouth every evening. (Patient not taking: Reported on 07/16/2023)     pioglitazone  (ACTOS ) 15 MG tablet Take 7.5 mg by mouth daily. (Patient not taking: Reported on 07/16/2023)     No current facility-administered medications for this visit.    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 b.) CTA abdomen/pelvis 02/13/2021: measured 2.9 x 2.7 cm.   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   a.) CTA abdomen/pelvis: measured 2.6 cm  GERD (gastroesophageal reflux disease)    Hyperlipemia    Hypertension    NSTEMI (non-ST elevated myocardial infarction) (HCC) 02/17/1999   a.) LHC 02/17/1999 --> 75% LAD and subtotal RCA with thrombus --> PCI performed placing stent (unknown type) to RCA   T2DM (type 2 diabetes mellitus) (HCC)     Past Surgical History:  Procedure Laterality Date   CATARACT EXTRACTION, BILATERAL     COLONOSCOPY WITH PROPOFOL  N/A 09/01/2018   Procedure: COLONOSCOPY WITH PROPOFOL ;  Surgeon: Toledo, Ladell POUR, MD;  Location: [facility];  Service: Gastroenterology;  Laterality: N/A;   CORONARY ANGIOPLASTY WITH STENT PLACEMENT     DIALYSIS/PERMA CATHETER INSERTION N/A 10/26/2021   Procedure: DIALYSIS/PERMA CATHETER INSERTION;  Surgeon: Marea Selinda RAMAN, MD;   Location: [facility];  Service: Cardiovascular;  Laterality: N/A;   DIALYSIS/PERMA CATHETER REMOVAL N/A 12/18/2021   Procedure: DIALYSIS/PERMA CATHETER REMOVAL;  Surgeon: Marea Selinda RAMAN, MD;  Location: [facility];  Service: Cardiovascular;  Laterality: N/A;   ENDOVASCULAR REPAIR/STENT GRAFT N/A 03/01/2021   Procedure: ENDOVASCULAR REPAIR/STENT GRAFT;  Surgeon: Marea Selinda RAMAN, MD;  Location: [facility];  Service: Cardiovascular;  Laterality: N/A;   ESOPHAGOGASTRODUODENOSCOPY (EGD) WITH PROPOFOL  N/A 03/13/2017   Procedure: ESOPHAGOGASTRODUODENOSCOPY (EGD) WITH PROPOFOL ;  Surgeon: Janalyn Keene NOVAK, MD;  Location: [facility];  Service: Endoscopy;  Laterality: N/A;   KNEE ARTHROSCOPY     RENAL ANGIOGRAPHY Bilateral 10/16/2021   Procedure: RENAL ANGIOGRAPHY;  Surgeon: Marea Selinda RAMAN, MD;  Location: [facility];  Service: Cardiovascular;  Laterality: Bilateral;   RENAL ANGIOGRAPHY Right 12/04/2021   Procedure: RENAL ANGIOGRAPHY;  Surgeon: Marea Selinda RAMAN, MD;  Location: [facility];  Service: Cardiovascular;  Laterality: Right;     Social History   Tobacco Use   Smoking status: Former    Current packs/day: 0.00    Types: Cigarettes    Quit date: 2000    Years since quitting: 25.5   Smokeless tobacco: Never  Vaping Use   Vaping status: Never Used  Substance Use Topics   Alcohol use: Yes    Comment: rarely   Drug use: No      Family History  Problem Relation Age of Onset   Breast cancer Other      No Known Allergies   REVIEW OF SYSTEMS (Negative unless checked)  Constitutional: [] Weight loss  [] Fever  [] Chills Cardiac: [] Chest pain   [] Chest pressure   [] Palpitations   [] Shortness of breath when laying flat   [] Shortness of breath at rest   [] Shortness of breath with exertion. Vascular:  [] Pain in legs with walking   [] Pain in legs at rest   [] Pain in legs when laying flat   [] Claudication   [] Pain in feet when walking  [] Pain in feet  at rest  [] Pain in feet when laying flat   [] History of DVT   [] Phlebitis   [] Swelling in legs   [] Varicose veins   [] Non-healing ulcers Pulmonary:   [] Uses home oxygen   [] Productive cough   [] Hemoptysis   [] Wheeze  [] COPD   [] Asthma Neurologic:  [] Dizziness  [] Blackouts   [] Seizures   [] History of stroke   [] History of TIA  [] Aphasia   [] Temporary blindness   [] Dysphagia   [] Weakness or numbness in arms   [] Weakness or numbness in legs Musculoskeletal:  [x] Arthritis   [] Joint swelling   [] Joint pain   [] Low back pain Hematologic:  [] Easy bruising  [] Easy bleeding   [] Hypercoagulable state   [  x]Anemic   Gastrointestinal:  [] Blood in stool   [] Vomiting blood  [] Gastroesophageal reflux/heartburn   [] Abdominal pain Genitourinary:  [x] Chronic kidney disease   [] Difficult urination  [] Frequent urination  [] Burning with urination   [] Hematuria Skin:  [] Rashes   [] Ulcers   [] Wounds Psychological:  [] History of anxiety   []  History of major depression.  Physical Examination  BP (!) 114/55 (BP Location: Left Arm, Patient Position: Sitting, Cuff Size: Normal)   Pulse (!) 58   Resp 18   Ht 5' 6 (1.676 m)   Wt 178 lb 6.4 oz (80.9 kg)   BMI 28.79 kg/m  Gen:  WD/WN, NAD.  Appears younger than stated age Head: [place]/AT, No temporalis wasting. Ear/Nose/Throat: Hearing grossly intact, nares w/o erythema or drainage Eyes: Conjunctiva clear. Sclera non-icteric Neck: Supple.  Trachea midline Pulmonary:  Good air movement, no use of accessory muscles.  Cardiac: RRR, no JVD Vascular:  Vessel Right Left  Radial Palpable Palpable                          PT Palpable Palpable  DP Palpable Palpable   Gastrointestinal: soft, non-tender/non-distended. No guarding/reflex.  Musculoskeletal: M/S 5/5 throughout.  No deformity or atrophy.  No edema. Neurologic: Sensation grossly intact in extremities.  Symmetrical.  Speech is fluent.  Psychiatric: Judgment intact, Mood & affect appropriate for pt's clinical  situation. Dermatologic: No rashes or ulcers noted.  No cellulitis or open wounds.      Labs Recent Results (from the past 2160 hours)  VAS US  ABI WITH/WO TBI     Status: None   Collection Time: 07/09/23  9:31 AM  Result Value Ref Range   Right ABI 1.21    Left ABI 1.10     Radiology VAS US  RENAL ARTERY DUPLEX Result Date: 07/15/2023 ABDOMINAL VISCERAL Patient Name:  Dawn Ford  Date of Exam:   07/09/2023 Medical Rec #: [id number]      Accession #:    [id number] Date of Birth: 10/05/1945       Patient Gender: F Patient Age:   61 years Exam Location:  [facility] Procedure:      VAS US  RENAL ARTERY DUPLEX Referring Phys: [id number] ORVIN BRAVO BROWN -------------------------------------------------------------------------------- Indications: RAS              SMA stenosis Vascular Interventions: 03/01/2021: Aortogram and Right Iliofemoral Angiogram.                         Coil embolization of the Right Internal Iliac Artery                         with 2 Gay coils. Repair of Right CIA Aneurysm with                         extensions into Right EIA using 10 mm diameter by 58 mm                         length lefestream stent and a 9 mm diameter diameter by                         58 mm lifestream stent.  10/16/2021: Aortogram and Selective lateral Renal                         Angiograms. Balloon expandable stent placement to the                         Left Renal Artery with a 6 mm diameter x 26 mm length                         stent.                          12/04/2021: Aortogram and Selective Right Renal                         Angiogram. Balloon expandable stent placement to the                         Right Renal Artery with a 5 mm diameter x 26 mm length                         stent. Comparison Study: 09/2022 Performing Technologist: Jerel Croak RVT  Examination Guidelines: A complete evaluation includes B-mode imaging, spectral Doppler, color Doppler, and power  Doppler as needed of all accessible portions of each vessel. Bilateral testing is considered an integral part of a complete examination. Limited examinations for reoccurring indications may be performed as noted.  Duplex Findings: +--------------------+--------+--------+------+-------------------------------+                     PSV cm/sEDV cm/sPlaque           Comments             +--------------------+--------+--------+------+-------------------------------+ Aorta Prox             65                                                 +--------------------+--------+--------+------+-------------------------------+ Aorta Mid              65                                                 +--------------------+--------+--------+------+-------------------------------+ Aorta Distal           53                 2.3cm (R CIA=1.4cm/L CIA=1.0cm) +--------------------+--------+--------+------+-------------------------------+ Celiac Artery Origin  177                                                 +--------------------+--------+--------+------+-------------------------------+ SMA Proximal          264                                                 +--------------------+--------+--------+------+-------------------------------+  SMA Mid               213                                                 +--------------------+--------+--------+------+-------------------------------+  +------------------+--------+--------+-------+ Right Renal ArteryPSV cm/sEDV cm/sComment +------------------+--------+--------+-------+ Proximal             42                   +------------------+--------+--------+-------+ Mid                  74                   +------------------+--------+--------+-------+ Distal               80                   +------------------+--------+--------+-------+ +-----------------+--------+--------+-------+ Left Renal ArteryPSV cm/sEDV cm/sComment  +-----------------+--------+--------+-------+ Proximal            52                   +-----------------+--------+--------+-------+ Mid                 60                   +-----------------+--------+--------+-------+ Distal              51                   +-----------------+--------+--------+-------+ +------------+--------+--------+----+-----------+--------+--------+----+ Right KidneyPSV cm/sEDV cm/sRI  Left KidneyPSV cm/sEDV cm/sRI   +------------+--------+--------+----+-----------+--------+--------+----+ Mid         66      12      0.        56      16      0.71 +------------+--------+--------+----+-----------+--------+--------+----+ +------------------+----+------------------+----+ Right Kidney          Left Kidney            +------------------+----+------------------+----+ RAR (manual)      1.23RAR (manual)      .92  +------------------+----+------------------+----+ Kidney length (cm)9.47Kidney length (cm)8.17 +------------------+----+------------------+----+  Summary: Largest Aortic Diameter: 2.2 cm  Renal:  Right: Normal size right kidney. Normal right Resisitive Index.        Normal cortical thickness of right kidney. 1-59% stenosis of        the right renal artery. RRV flow present. Echogenic renal        parenchymal tissue appears echogenic suggestive of renal        disease as was seen previously. Evidence of patent RA stent. Left:  Abnormal size for the left kidney. Normal left Resistive        Index. Normal cortical thickness of the left kidney. 1-59%        stenosis of the left renal artery. LRV flow present.        Echogenic renal parenchymal tissue appears echogenic        suggestive of renal disease as was seen previously. Evidence        of patent RA stent. Mesenteric: Normal Celiac artery and Superior Mesenteric artery findings. Improved flow within the SMA and celiac arteries showing velocities in the upper limits normal values.  *See  table(s) above for measurements and observations.  Diagnosing physician: Selinda Gu  MD  Electronically signed by Selinda Gu MD on 07/15/2023 at 1:01:30 PM.    Final    VAS US  ABI WITH/WO TBI Result Date: 07/10/2023  LOWER EXTREMITY DOPPLER STUDY Patient Name:  Dawn Ford  Date of Exam:   07/09/2023 Medical Rec #: [id number]      Accession #:    [id number] Date of Birth: Apr 12, 1945       Patient Gender: F Patient Age:   21 years Exam Location:  [facility] Procedure:      VAS US  ABI WITH/WO TBI Referring Phys: --------------------------------------------------------------------------------  Indications: Peripheral artery disease. High Risk Factors: Hypertension, coronary artery disease.  Vascular Interventions: 03/01/2021 Repair of right common iliac artery aneurysm                         with extension into the right external iliac artery                         using 10 mm diameter by 58 mm length lifestream stent                         and a 9 mm diameter by 58 mm Lifestream stent AND Coil                         embolization of the right internal iliac artery with 2                         Noraa coils. Comparison Study: 07/07/2021 Performing Technologist: Jerel Croak RVT  Examination Guidelines: A complete evaluation includes at minimum, Doppler waveform signals and systolic blood pressure reading at the level of bilateral brachial, anterior tibial, and posterior tibial arteries, when vessel segments are accessible. Bilateral testing is considered an integral part of a complete examination. Photoelectric Plethysmograph (PPG) waveforms and toe systolic pressure readings are included as required and additional duplex testing as needed. Limited examinations for reoccurring indications may be performed as noted.  ABI Findings: +---------+------------------+-----+---------+--------+ Right    Rt Pressure (mmHg)IndexWaveform Comment  +---------+------------------+-----+---------+--------+ Brachial 123                                       +---------+------------------+-----+---------+--------+ PTA      147               1.18 triphasic         +---------+------------------+-----+---------+--------+ DP       151               1.21 triphasic         +---------+------------------+-----+---------+--------+ Great Toe118               0.94 Normal            +---------+------------------+-----+---------+--------+ +---------+------------------+-----+---------+-------+ Left     Lt Pressure (mmHg)IndexWaveform Comment +---------+------------------+-----+---------+-------+ Brachial 125                                     +---------+------------------+-----+---------+-------+ PTA      138               1.10 biphasic         +---------+------------------+-----+---------+-------+ DP  132               1.06 triphasic        +---------+------------------+-----+---------+-------+ Great Toe115               0.92 Normal           +---------+------------------+-----+---------+-------+ +-------+-----------+-----------+------------+------------+ ABI/TBIToday's ABIToday's TBIPrevious ABIPrevious TBI +-------+-----------+-----------+------------+------------+ Right  1.21       .94        1.10        .77          +-------+-----------+-----------+------------+------------+ Left   1.10       .92        1.11        .76          +-------+-----------+-----------+------------+------------+ Bilateral ABIs appear essentially unchanged compared to prior study on 06/2021.  Summary: Right: Resting right ankle-brachial index is within normal range. The right toe-brachial index is normal. Left: Resting left ankle-brachial index is within normal range. The left toe-brachial index is normal. *See table(s) above for measurements and observations.  Electronically signed by Selinda Gu MD on 07/10/2023 at 3:20:13 PM.    Final     Assessment/Plan  Iliac artery aneurysm (HCC) ABIs are  1.2 bilaterally with triphasic waveforms.  Duplex of the renals were done and on that her iliac stent appeared patent without obvious aneurysmal degeneration at this point.  Check annually with duplex.  Renal artery stenosis (HCC) Renal artery duplex demonstrates no evidence of hemodynamically significant renal artery stenosis bilaterally after previous stenting.  Renal function has returned and her GFR at last check was in the 40s.  Blood pressure control important going forward and if she has worsening blood pressure she should contact our office.  Otherwise, follow with annual renal artery duplex.  Hypertensive urgency blood pressure control important in reducing the progression of atherosclerotic disease. On appropriate oral medications.  Blood pressure control much better after renal artery intervention.   Type II diabetes mellitus with renal manifestations (HCC) blood glucose control important in reducing the progression of atherosclerotic disease. Also, involved in wound healing. On appropriate medications.    Selinda Gu, MD  07/17/2023 1:45 PM    This note was created with Dragon medical transcription system.  Any errors from dictation are purely unintentional

## 2023-07-17 NOTE — Assessment & Plan Note (Signed)
 ABIs are 1.2 bilaterally with triphasic waveforms.  Duplex of the renals were done and on that her iliac stent appeared patent without obvious aneurysmal degeneration at this point.  Check annually with duplex.

## 2023-07-17 NOTE — Assessment & Plan Note (Signed)
 blood pressure control important in reducing the progression of atherosclerotic disease. On appropriate oral medications.  Blood pressure control much better after renal artery intervention.

## 2023-07-17 NOTE — Assessment & Plan Note (Signed)
 blood glucose control important in reducing the progression of atherosclerotic disease. Also, involved in wound healing. On appropriate medications.

## 2023-07-17 NOTE — Assessment & Plan Note (Signed)
 Renal artery duplex demonstrates no evidence of hemodynamically significant renal artery stenosis bilaterally after previous stenting.  Renal function has returned and her GFR at last check was in the 40s.  Blood pressure control important going forward and if she has worsening blood pressure she should contact our office.  Otherwise, follow with annual renal artery duplex.

## 2023-07-29 ENCOUNTER — Ambulatory Visit
Admission: RE | Admit: 2023-07-29 | Discharge: 2023-07-29 | Disposition: A | Discharge status: Home / Self Care | Source: Ambulatory Visit | Attending: Internal Medicine | Admitting: Internal Medicine

## 2023-07-29 DIAGNOSIS — Z1231 Encounter for screening mammogram for malignant neoplasm of breast: Secondary | ICD-10-CM | POA: Diagnosis present

## 2024-07-14 ENCOUNTER — Encounter (INDEPENDENT_AMBULATORY_CARE_PROVIDER_SITE_OTHER)

## 2024-07-14 ENCOUNTER — Ambulatory Visit (INDEPENDENT_AMBULATORY_CARE_PROVIDER_SITE_OTHER): Admitting: Vascular Surgery
